# Patient Record
Sex: Female | Born: 1955 | Race: Black or African American | Hispanic: No | Marital: Married | Smoking: Never smoker
Health system: Southern US, Community
[De-identification: ages and names within clinical notes are randomized; demographics above are authoritative.]

## PROBLEM LIST (undated history)

## (undated) DIAGNOSIS — Z9109 Other allergy status, other than to drugs and biological substances: Secondary | ICD-10-CM

## (undated) DIAGNOSIS — D869 Sarcoidosis, unspecified: Secondary | ICD-10-CM

## (undated) DIAGNOSIS — I10 Essential (primary) hypertension: Secondary | ICD-10-CM

## (undated) DIAGNOSIS — J45909 Unspecified asthma, uncomplicated: Secondary | ICD-10-CM

## (undated) HISTORY — PX: CHOLECYSTECTOMY: SHX55

## (undated) HISTORY — DX: Other allergy status, other than to drugs and biological substances: Z91.09

## (undated) HISTORY — DX: Sarcoidosis, unspecified: D86.9

---

## 2005-05-25 ENCOUNTER — Ambulatory Visit: Payer: Self-pay

## 2005-06-21 ENCOUNTER — Ambulatory Visit: Payer: Self-pay | Admitting: Internal Medicine

## 2006-08-14 ENCOUNTER — Emergency Department: Payer: Self-pay | Admitting: Emergency Medicine

## 2007-02-14 ENCOUNTER — Ambulatory Visit: Payer: Self-pay | Admitting: Internal Medicine

## 2010-04-22 ENCOUNTER — Ambulatory Visit: Payer: Self-pay | Admitting: Internal Medicine

## 2010-09-22 ENCOUNTER — Ambulatory Visit: Payer: Self-pay | Admitting: Internal Medicine

## 2011-10-11 ENCOUNTER — Ambulatory Visit: Payer: Self-pay | Admitting: Internal Medicine

## 2012-01-24 ENCOUNTER — Ambulatory Visit: Payer: Self-pay

## 2012-10-17 ENCOUNTER — Ambulatory Visit: Payer: Self-pay

## 2013-06-01 ENCOUNTER — Inpatient Hospital Stay: Payer: Self-pay | Admitting: Internal Medicine

## 2013-06-01 LAB — BASIC METABOLIC PANEL
BUN: 22 mg/dL — ABNORMAL HIGH (ref 7–18)
Chloride: 105 mmol/L (ref 98–107)
Co2: 27 mmol/L (ref 21–32)
EGFR (African American): 55 — ABNORMAL LOW
EGFR (Non-African Amer.): 48 — ABNORMAL LOW
Glucose: 111 mg/dL — ABNORMAL HIGH (ref 65–99)
Osmolality: 280 (ref 275–301)
Potassium: 4.1 mmol/L (ref 3.5–5.1)

## 2013-06-01 LAB — CBC
HCT: 36.5 % (ref 35.0–47.0)
MCH: 28.4 pg (ref 26.0–34.0)
MCHC: 33.1 g/dL (ref 32.0–36.0)
WBC: 4.9 10*3/uL (ref 3.6–11.0)

## 2013-06-01 LAB — CK TOTAL AND CKMB (NOT AT ARMC): CK, Total: 105 U/L (ref 21–215)

## 2013-06-01 LAB — DIFFERENTIAL
Basophil #: 0 10*3/uL (ref 0.0–0.1)
Basophil %: 0.6 %
Eosinophil #: 0.1 10*3/uL (ref 0.0–0.7)
Eosinophil %: 1.2 %
Monocyte #: 0.6 x10 3/mm (ref 0.2–0.9)
Monocyte %: 11.8 %

## 2013-06-02 LAB — BASIC METABOLIC PANEL
Anion Gap: 8 (ref 7–16)
BUN: 19 mg/dL — ABNORMAL HIGH (ref 7–18)
Calcium, Total: 8.6 mg/dL (ref 8.5–10.1)
Glucose: 96 mg/dL (ref 65–99)
Osmolality: 283 (ref 275–301)
Potassium: 3.6 mmol/L (ref 3.5–5.1)

## 2013-06-02 LAB — CBC WITH DIFFERENTIAL/PLATELET
HGB: 10.4 g/dL — ABNORMAL LOW (ref 12.0–16.0)
Lymphocyte #: 0.8 10*3/uL — ABNORMAL LOW (ref 1.0–3.6)
Lymphocyte %: 20 %
MCH: 28.5 pg (ref 26.0–34.0)
MCHC: 33 g/dL (ref 32.0–36.0)
MCV: 86 fL (ref 80–100)
Monocyte %: 13.9 %
Neutrophil #: 2.5 10*3/uL (ref 1.4–6.5)
RBC: 3.66 10*6/uL — ABNORMAL LOW (ref 3.80–5.20)
RDW: 14.6 % — ABNORMAL HIGH (ref 11.5–14.5)
WBC: 3.9 10*3/uL (ref 3.6–11.0)

## 2013-06-03 LAB — CBC WITH DIFFERENTIAL/PLATELET
Basophil %: 0.1 %
Eosinophil %: 0 %
HCT: 32.8 % — ABNORMAL LOW (ref 35.0–47.0)
Lymphocyte #: 0.4 10*3/uL — ABNORMAL LOW (ref 1.0–3.6)
Lymphocyte %: 11 %
MCH: 28.3 pg (ref 26.0–34.0)
MCHC: 33 g/dL (ref 32.0–36.0)
MCV: 86 fL (ref 80–100)
Monocyte %: 3.6 %
Neutrophil #: 3.4 10*3/uL (ref 1.4–6.5)
Platelet: 185 10*3/uL (ref 150–440)
RBC: 3.82 10*6/uL (ref 3.80–5.20)
RDW: 14.1 % (ref 11.5–14.5)
WBC: 4 10*3/uL (ref 3.6–11.0)

## 2013-06-03 LAB — BASIC METABOLIC PANEL
Anion Gap: 9 (ref 7–16)
BUN: 21 mg/dL — ABNORMAL HIGH (ref 7–18)
Calcium, Total: 9.4 mg/dL (ref 8.5–10.1)
Chloride: 106 mmol/L (ref 98–107)
Co2: 26 mmol/L (ref 21–32)
Creatinine: 1.03 mg/dL (ref 0.60–1.30)
EGFR (African American): >60
Glucose: 139 mg/dL — ABNORMAL HIGH (ref 65–99)
Osmolality: 286 (ref 275–301)
Sodium: 141 mmol/L (ref 136–145)

## 2013-06-06 LAB — CULTURE, BLOOD (SINGLE)

## 2013-12-03 ENCOUNTER — Ambulatory Visit: Payer: Self-pay

## 2015-01-23 ENCOUNTER — Emergency Department: Payer: Self-pay | Admitting: Emergency Medicine

## 2015-01-28 ENCOUNTER — Ambulatory Visit: Payer: Self-pay | Admitting: Physician Assistant

## 2015-03-27 NOTE — H&P (Signed)
PATIENT NAME:  Marissa Luna, Marissa Luna MR#:  [id number] DATE OF BIRTH:  01/11/1956  DATE OF ADMISSION:  06/01/2013  Addendum  SOCIAL HISTORY:  The patient was never a smoker.  No alcohol or drug use.  Married, lives with her husband.    ____________________________ Krystal EatonShayiq Samson Ralph, MD sa:ea D: 06/01/2013 23:04:40 ET T: 06/02/2013 00:17:41 ET JOB#: [id number]  cc: Krystal EatonShayiq Janina Trafton, MD, <Dictator> Krystal EatonSHAYIQ Nicki Gracy MD ELECTRONICALLY SIGNED 07/06/2013 11:31

## 2015-03-27 NOTE — H&P (Signed)
PATIENT NAME:  Marissa Luna, [name] MR#:  [id number] DATE OF BIRTH:  [birth year]  DATE OF ADMISSION:  06/01/2013  PRIMARY CARE PHYSICIAN:  Dr. Beverely RisenFozia Khan.   PRIMARY PULMONOLOGIST:  Dr. Freda MunroSaadat Khan.   REFERRING PHYSICIAN:  Dr. Darnelle CatalanMalinda.  CHIEF COMPLAINT:  Shortness of breath, fever.   HISTORY OF PRESENT ILLNESS:  The patient is a pleasant 59 year old African American female with a history of sarcoid, COPD, hypertension, who presents with shortness of breath, cough, fevers.  The patient states that she has chronic dyspnea on exertion and over the last couple of days has been having a productive yellow-greenish sputum with fevers, feeling hot and cold and is more short of breath than baseline.  She came into the hospital where she was found to have hypoxia with O2 sats as low as 87 with ambulation.  She has a fever and tachycardia as well.  X-ray of the chest shows likely acute interstitial edema or pneumonia superimposed upon pulmonary fibrosis.  Hospitalist service were contacted for further evaluation and management.  The patient has been given azithromycin and ceftriaxone.   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   OUTPATIENT MEDICATIONS:  Spiriva 18 mcg inhaled daily, ProAir HFA 2 puffs 4 times a day as needed, Dulera 5/100 mcg 2 puffs 3 times a day and amlodipine 10 mg daily.  The patient also takes vitamin D and vitamin B12.   FAMILY HISTORY:  Three brothers with cancer.  Parents with cancer.  Sister with diabetes.   PAST SURGICAL HISTORY:  No surgeries.   REVIEW OF SYSTEMS:  CONSTITUTIONAL:  Positive for fever.  EYES:  No blurry vision, double vision.  EARS, NOSE, THROAT:  No tinnitus or hearing loss.  No snoring or sore throat.  RESPIRATORY:  Positive for cough which is productive.  Positive for shortness of breath, COPD and sarcoid.  CARDIOVASCULAR:  Denies chest pain.  Has no orthopnea.   Has no lower extremity edema, but has dyspnea on exertion.  No chest pains.  GASTROINTESTINAL:  No nausea, vomiting, diarrhea, abdominal pain or hematemesis.  GENITOURINARY:  Denies dysuria, hematuria.  HEMATOLOGY AND LYMPHATIC:  No anemia or easy bruising.  SKIN:  Has cutaneous sarcoid.  MUSCULOSKELETAL:  Has some aches in the lower extremities for the past couple of days. NEUROLOGIC:  No numbness, weakness.  PSYCHIATRIC:  No anxiety or insomnia.   PHYSICAL EXAMINATION:  VITAL SIGNS:  Temperature on arrival was 100.9, pulse rate was 120, respiratory rate 20, blood pressure on arrival 138/65.  O2 sat was 90% on room air.  It goes down to 87% with ambulation with heart rate of 130.   GENERAL:  The patient is a well-developed [place] female lying in bed in no obvious distress.  HEENT:  Normocephalic, atraumatic.  Pupils are equal and reactive.  Anicteric sclerae.  Extraocular muscles intact.  Moist mucous membranes.  There is cutaneous plaques and patches on the face, both sides, more around the bridge of the nose and the right side lateral to the nose.  NECK:  Supple.  No thyroid tenderness.  No cervical lymphadenopathy.  CARDIOVASCULAR:  S1, S2, tachycardic.  No murmurs, rubs or gallops.  LUNGS:  Bilateral lower coarse crackles to mid lungs, good air entry on the upper lobes.  ABDOMEN:  Soft, nontender, nondistended.  Positive bowel sounds in all quadrants.  EXTREMITIES:  No significant lower extremity edema.  NEUROLOGIC:  Cranial nerves II through XII grossly intact.  Strength is 5 out of 5 in all extremities.  Sensation is intact to light touch.  SKIN:  No other rashes except for the findings on the face and some healed patches on the front thorax.  PSYCHIATRIC:  Awake, alert, oriented x 3.   LABORATORY DATA:  Glucose 111, BUN 22, creatinine 1.24, sodium 138, potassium 4.1, CK-MB, CK total and troponins not elevated.  WBC 4.9, hemoglobin 12.1, platelets are 173.   EKG:  Sinus  tachycardia, rate is 120.  No acute ST elevations or depressions.   ASSESSMENT AND PLAN:  We have a pleasant 59 year old female with history of chronic obstructive pulmonary disease who has never been a smoker with a history of pulmonary and cutaneous sarcoid who sees Dr. Freda Munro, previously on steroids, not now, who presents with acute on chronic respiratory failure and positive systemic inflammatory response syndrome criteria likely sepsis with pneumonia as the source.  Would admit the patient to the hospital.  Start her on ceftriaxone and azithromycin to cover the lung as well as atypical organisms.  We will send sputum cultures.  I would start the patient on oxygen, nebs every 4 hours while awake and consider a pulmonary consult if she does not improve.  The patient has never been a smoker and this is likely not a chronic obstructive pulmonary disease flare.  We would follow with the cultures including the blood pressure which have been sent for the likely sepsis.  Would continue the Spiriva and Dulera.  The patient may take her own Elwin Sleight as we do not have that in the formulary here in the pharmacy.  Her blood pressure is stable.  Would continue the amlodipine.  We would watch her sats and the patient is FULL CODE.   Total time spent is 40 minutes.  The patient's tachycardia is likely driven by her lungs and shortness of breath and sepsis.  It is sinus and this could be monitored on remote tele.    ____________________________ Krystal Eaton, MD sa:ea D: 06/01/2013 22:53:47 ET T: 06/01/2013 23:53:57 ET JOB#: [id number]  cc: Krystal Eaton, MD, <Dictator> Yevonne Pax, MD Lyndon Code, MD Marcelle Smiling [facility] MD ELECTRONICALLY SIGNED 07/06/2013 11:31

## 2015-03-27 NOTE — Discharge Summary (Signed)
PATIENT NAME:  Marissa Luna, Marissa Luna MR#:  [id number] DATE OF BIRTH:  Mar 15, 1956  DATE OF ADMISSION:  06/01/2013 DATE OF DISCHARGE:  06/04/2013  PRIMARY CARE PHYSICIAN:  Dr. Beverely Risen.   PULMONOLOGIST: Freda Munro.     FINAL DIAGNOSES: 1.  Acute respiratory failure, now chronic with pulse ox dropping to 85% with ambulation.  2.  Clinical sepsis with pneumonia.  3.  History of sarcoidosis.  4.  History of hypertension.   MEDICATIONS ON DISCHARGE: Include Spiriva 1 inhalation daily, ProAir HFA CFC 2 puffs 4 times a day as needed, Dulera 5/100 mcg per inhalation 2 puffs twice a day, prednisone 20 mg 3 tablets once a day until seen by Dr. Welton Flakes on Thursday. He will then start tapering dose. Cefuroxime 500 mg every 12 hours for 7 days, Zithromax 250 mg daily for another 2 days, oxygen 1 liter at rest, 2 liters with ambulation at night.   DIET: Low sodium diet, regular consistency.   ACTIVITY: As tolerated.   DISCHARGE FOLLOWUP:  Follow up with Dr. Freda Munro on Thursday at 11:00 a.m., in 2 to 4 weeks with Dr. Beverely Risen.   HOSPITAL COURSE: The patient was admitted in the evening of 06/01/2013 and discharged in the morning of 06/04/2013. The patient came in with a chief complaint of shortness of breath and fever. She was started on ceftriaxone and Zithromax. She was started on oxygen supplementation secondary to low pulse oximetry. Her laboratory and radiological data during the hospital course included an EKG that showed sinus tachycardia. Glucose 111, BUN 22, creatinine 1.24, sodium 138, potassium 4.1, chloride 105, CO2 of 27, calcium 9.3. White blood cell count 4.9, H and H 12.1 and 36.5, platelet count of 173. Troponin negative. Chest x-ray showed findings worrisome for acute interstitial edema or pneumonia superimposed on bilateral pulmonary fibrosis. Blood cultures negative for 48 hours. Creatinine on the 30th, 1.03. White blood cell count on the 30th, 4.0, hemoglobin 10.8, platelet count on 185. Pulse  ox after ambulation by respiratory dropped down to 82%. Did recover at rest on room air to 93%. When I walked around the patient, it dropped down to 85% with ambulation and recovered up to 93% at rest.   HOSPITAL COURSE PER PROBLEM LIST:  1.  For the patient's acute respiratory failure, this could now be chronic with her pulse ox dropping down to 85% with ambulation. I did set her up for 24/7 oxygen. She already had oxygen at night. This is likely secondary to the sarcoidosis in the lungs.  Patient at rest, pulse ox was 93%, so she will definitely need the oxygen while ambulating and at night.  This will become an issue with her job. I did give her a work note until seen by followup appointment with Dr. Freda Munro as outpatient.  2.  Clinical sepsis with pneumonia. The patient did improve with Rocephin and Zithromax, and switched over to Ceftin and Zithromax upon discharge. Most improvement happened when I started the IV Solu-Medrol, starting on 06/02/2013. Will complete the antibiotic course as outpatient.  3.  History of sarcoidosis. I did start steroids, high dose, on June 29th. I was almost going to send her home on the 30th, but the patient would rather give it another day, and since she was hypoxic, I kept her another day. I switched her over to 60 mg of prednisone on July 1st. She will stay on that dose of 60 mg daily until seen by Dr. Freda Munro as outpatient, and he  will consider tapering at that point.  4.  History of hypertension. Blood pressure was on the lower side here, so I did stop the amlodipine.   TIME SPENT ON DISCHARGE: 40 minutes, coordination of care.   ____________________________ Herschell Dimesichard J. Renae GlossWieting, MD rjw:dmm D: 06/04/2013 16:41:43 ET T: 06/04/2013 20:00:40 ET JOB#: [id number]  cc: Herschell Dimesichard J. Renae GlossWieting, MD, <Dictator> Lyndon CodeFozia M. Khan, MD Yevonne PaxSaadat A. Khan, MD Salley ScarletICHARD J Bahar Shelden MD ELECTRONICALLY SIGNED 06/15/2013 11:06

## 2015-10-23 ENCOUNTER — Encounter: Payer: Self-pay | Admitting: *Deleted

## 2015-10-26 ENCOUNTER — Ambulatory Visit: Payer: Managed Care, Other (non HMO) | Admitting: Anesthesiology

## 2015-10-26 ENCOUNTER — Ambulatory Visit
Admission: RE | Admit: 2015-10-26 | Discharge: 2015-10-26 | Disposition: A | Discharge status: Home Health | Payer: Managed Care, Other (non HMO) | Source: Ambulatory Visit | Attending: Gastroenterology | Admitting: Gastroenterology

## 2015-10-26 ENCOUNTER — Encounter
Admission: RE | Disposition: A | Discharge status: Home Health | Payer: Self-pay | Source: Ambulatory Visit | Attending: Gastroenterology

## 2015-10-26 ENCOUNTER — Encounter: Payer: Self-pay | Admitting: *Deleted

## 2015-10-26 DIAGNOSIS — I10 Essential (primary) hypertension: Secondary | ICD-10-CM | POA: Diagnosis not present

## 2015-10-26 DIAGNOSIS — Z9049 Acquired absence of other specified parts of digestive tract: Secondary | ICD-10-CM | POA: Diagnosis not present

## 2015-10-26 DIAGNOSIS — K5730 Diverticulosis of large intestine without perforation or abscess without bleeding: Secondary | ICD-10-CM | POA: Insufficient documentation

## 2015-10-26 DIAGNOSIS — Z79899 Other long term (current) drug therapy: Secondary | ICD-10-CM | POA: Diagnosis not present

## 2015-10-26 DIAGNOSIS — J45909 Unspecified asthma, uncomplicated: Secondary | ICD-10-CM | POA: Insufficient documentation

## 2015-10-26 DIAGNOSIS — Z800 Family history of malignant neoplasm of digestive organs: Secondary | ICD-10-CM | POA: Diagnosis not present

## 2015-10-26 DIAGNOSIS — Z1211 Encounter for screening for malignant neoplasm of colon: Secondary | ICD-10-CM | POA: Diagnosis not present

## 2015-10-26 HISTORY — DX: Unspecified asthma, uncomplicated: J45.909

## 2015-10-26 HISTORY — DX: Essential (primary) hypertension: I10

## 2015-10-26 HISTORY — PX: COLONOSCOPY: SHX5424

## 2015-10-26 SURGERY — COLONOSCOPY
Anesthesia: General

## 2015-10-26 MED ORDER — SODIUM CHLORIDE 0.9 % IV SOLN
INTRAVENOUS | Status: DC
  Administered 2015-10-26: 09:00:00 via INTRAVENOUS

## 2015-10-26 MED ORDER — PROPOFOL 500 MG/50ML IV EMUL
INTRAVENOUS | Status: DC | PRN
Start: 2015-10-26 — End: 2015-10-26
  Administered 2015-10-26: 100 ug/kg/min via INTRAVENOUS

## 2015-10-26 MED ORDER — SODIUM CHLORIDE 0.9 % IV SOLN
INTRAVENOUS | Status: DC | PRN
  Administered 2015-10-26: 09:00:00 via INTRAVENOUS

## 2015-10-26 MED ORDER — SODIUM CHLORIDE 0.9 % IV SOLN: INTRAVENOUS | Status: DC

## 2015-10-26 NOTE — Op Note (Signed)
[facility] Patient Name: Donivan ScullKatie Mcarthy Procedure Date: 10/26/2015 8:41 AM MRN: [id number] Account #: [id number] Date of Birth: 08/03/1956 Admit Type: Outpatient Age: [age] [facility] ROOM 4 Gender: Female Note Status: Finalized Procedure:         Colonoscopy Indications:       Screening in patient at increased risk: Family history of                     1st-degree relative with colorectal cancer Providers:         Ezzard StandingPaul Y. Bluford Kaufmannh, MD Referring MD:      Lyndon CodeFozia M. Khan, MD (Referring MD) Medicines:         Monitored Anesthesia Care Complications:     No immediate complications. Procedure:         Pre-Anesthesia Assessment:                    - Prior to the procedure, a History and Physical was                     performed, and patient medications, allergies and                     sensitivities were reviewed. The patient's tolerance of                     previous anesthesia was reviewed.                    - The risks and benefits of the procedure and the sedation                     options and risks were discussed with the patient. All                     questions were answered and informed consent was obtained.                    - After reviewing the risks and benefits, the patient was                     deemed in satisfactory condition to undergo the procedure.                    After obtaining informed consent, the colonoscope was                     passed under direct vision. Throughout the procedure, the                     patient's blood pressure, pulse, and oxygen saturations                     were monitored continuously. The Olympus CF-H180AL                     colonoscope ( S#: [id number] ) was introduced through the                     anus and advanced to the the cecum, identified by                     appendiceal orifice and ileocecal valve. The colonoscopy  was performed with difficulty due to restricted  mobility                     of the colon. Successful completion of the procedure was                     aided by withdrawing the scope and replacing with the                     pediatric endoscope. The patient tolerated the procedure                     well. The quality of the bowel preparation was fair. Findings:      Multiple small and large-mouthed diverticula were found in the sigmoid       colon and in the descending colon. Was not able to navigate around       sigmoid with CF scope. Switched to PCF. With this scope, able to reach       cecum.      The exam was otherwise without abnormality. Impression:        - Diverticulosis in the sigmoid colon and in the                     descending colon.                    - The examination was otherwise normal.                    - No specimens collected. Recommendation:    - Discharge patient to home.                    - Repeat colonoscopy in 5 years for surveillance.                    - High fiber diet daily. Procedure Code(s): --- Professional ---                    [phone], Colonoscopy, flexible; diagnostic, including                     collection of specimen(s) by brushing or washing, when                     performed (separate procedure) Diagnosis Code(s): --- Professional ---                    Z80.0, Family history of malignant neoplasm of digestive                     organs                    K57.30, Diverticulosis of large intestine without                     perforation or abscess without bleeding CPT copyright 2014 American Medical Association. All rights reserved. The codes documented in this report are preliminary and upon coder review may  be revised to meet current compliance requirements. Wallace Cullens, MD 10/26/2015 9:04:25 AM This report has been signed electronically. Number of Addenda: 0 Note Initiated On: 10/26/2015 8:41 AM Scope Withdrawal Time: 0 hours 4 minutes 26 seconds  Total Procedure Duration: 0  hours 16 minutes 52 seconds  [facility]

## 2015-10-26 NOTE — H&P (Signed)
    Primary Care Physician:  No primary care provider on file. Primary Gastroenterologist:  Dr. Bluford Kaufmannh  Pre-Procedure History & Physical: HPI:  Marissa Luna is a 59 y.o. female is here for an colonoscopy.   Past Medical History  Diagnosis Date  . Asthma   . Hypertension     Past Surgical History  Procedure Laterality Date  . No past surgeries    . Cholecystectomy      Prior to Admission medications   Medication Sig Start Date End Date Taking? Authorizing Provider  amLODipine (NORVASC) 5 MG tablet Take 5 mg by mouth daily.   Yes Historical Provider, MD  predniSONE (DELTASONE) 5 MG tablet Take 5 mg by mouth daily with breakfast.   Yes Historical Provider, MD  tiotropium (SPIRIVA) 18 MCG inhalation capsule Place 18 mcg into inhaler and inhale daily.   Yes Historical Provider, MD    Allergies as of 09/21/2015  . (Not on File)    History reviewed. No pertinent family history.  Social History   Social History  . Marital Status: Married    Spouse Name: N/A  . Number of Children: N/A  . Years of Education: N/A   Occupational History  . Not on file.   Social History Main Topics  . Smoking status: Never Smoker   . Smokeless tobacco: Never Used  . Alcohol Use: No  . Drug Use: No  . Sexual Activity: Not on file   Other Topics Concern  . Not on file   Social History Narrative    Review of Systems: See HPI, otherwise negative ROS  Physical Exam: BP 141/69 mmHg  Pulse 92  Temp(Src) 98.3 F (36.8 C) (Tympanic)  Resp 14  Ht 5' (1.524 m)  Wt 68.04 kg (150 lb)  BMI 29.30 kg/m2  SpO2 99% General:   Alert,  pleasant and cooperative in NAD Head:  Normocephalic and atraumatic. Neck:  Supple; no masses or thyromegaly. Lungs:  Clear throughout to auscultation.    Heart:  Regular rate and rhythm. Abdomen:  Soft, nontender and nondistended. Normal bowel sounds, without guarding, and without rebound.   Neurologic:  Alert and  oriented x4;  grossly normal  neurologically.  Impression/Plan: Marissa Luna is here for an colonoscopy to be performed for screening, family hx of colon cancer  Risks, benefits, limitations, and alternatives regarding colonoscopy rewed with the patient.  Questions have been answered.  All parties agreeable.   Hussam Muniz, Ezzard StandingPAUL Y, MD  10/26/2015, 8:29 AM

## 2015-10-26 NOTE — Anesthesia Preprocedure Evaluation (Addendum)
Anesthesia Evaluation  Patient identified by MRN, date of birth, ID band Patient awake    Reviewed: Allergy & Precautions, NPO status , Patient's Chart, lab work & pertinent test results  Airway Mallampati: III  TM Distance: >3 FB     Dental no notable dental hx.    Pulmonary asthma ,    Pulmonary exam normal        Cardiovascular hypertension, Pt. on medications Normal cardiovascular exam     Neuro/Psych negative neurological ROS  negative psych ROS   GI/Hepatic negative GI ROS, Neg liver ROS,   Endo/Other  negative endocrine ROS  Renal/GU negative Renal ROS  negative genitourinary   Musculoskeletal negative musculoskeletal ROS (+)   Abdominal Normal abdominal exam  (+)   Peds negative pediatric ROS (+)  Hematology negative hematology ROS (+)   Anesthesia Other Findings   Reproductive/Obstetrics                            Anesthesia Physical Anesthesia Plan  ASA: II  Anesthesia Plan: General   Post-op Pain Management:    Induction: Intravenous  Airway Management Planned: Nasal Cannula  Additional Equipment:   Intra-op Plan:   Post-operative Plan:   Informed Consent: I have reviewed the patients History and Physical, chart, labs and discussed the procedure including the risks, benefits and alternatives for the proposed anesthesia with the patient or authorized representative who has indicated his/her understanding and acceptance.   Dental advisory given  Plan Discussed with: CRNA and Surgeon  Anesthesia Plan Comments:         Anesthesia Quick Evaluation

## 2015-10-26 NOTE — Transfer of Care (Signed)
Immediate Anesthesia Transfer of Care Note  Patient: Marissa Luna  Procedure(s) Performed: Procedure(s): COLONOSCOPY (N/A)  Patient Location: PACU and Endoscopy Unit  Anesthesia Type:General  Level of Consciousness: patient cooperative and lethargic  Airway & Oxygen Therapy: Patient Spontanous Breathing and Patient connected to nasal cannula oxygen  Post-op Assessment: Report given to RN and Post -op Vital signs reviewed and stable  Post vital signs: Reviewed and stable  Last Vitals:  Filed Vitals:   10/26/15 0748 10/26/15 0906  BP: 141/69 104/53  Pulse: 92 86  Temp: 36.8 C 35.5 C  Resp: 14     Complications: No apparent anesthesia complications

## 2015-10-27 ENCOUNTER — Encounter: Payer: Self-pay | Admitting: Gastroenterology

## 2015-10-30 NOTE — Anesthesia Postprocedure Evaluation (Signed)
Anesthesia Post Note  Patient: Marissa MiyamotoKatie S Luna  Procedure(s) Performed: Procedure(s) (LRB): COLONOSCOPY (N/A)  Patient location during evaluation: Endoscopy Anesthesia Type: General Level of consciousness: awake, awake and alert and oriented Pain management: pain level controlled Vital Signs Assessment: post-procedure vital signs reviewed and stable Respiratory status: spontaneous breathing Cardiovascular status: blood pressure returned to baseline Anesthetic complications: no    Last Vitals:  Filed Vitals:   10/26/15 0928 10/26/15 0940  BP: 116/58 120/59  Pulse: 72 75  Temp:    Resp: 18 16    Last Pain:  Filed Vitals:   10/27/15 0747  PainSc: 0-No pain                 Arhianna Ebey

## 2016-02-11 ENCOUNTER — Ambulatory Visit
Admission: RE | Admit: 2016-02-11 | Discharge: 2016-02-11 | Disposition: A | Discharge status: Home / Self Care | Payer: Managed Care, Other (non HMO) | Source: Ambulatory Visit | Attending: Physician Assistant | Admitting: Physician Assistant

## 2016-02-11 ENCOUNTER — Other Ambulatory Visit: Payer: Self-pay | Admitting: Physician Assistant

## 2016-02-11 DIAGNOSIS — J8410 Pulmonary fibrosis, unspecified: Secondary | ICD-10-CM | POA: Diagnosis not present

## 2016-02-11 DIAGNOSIS — R05 Cough: Secondary | ICD-10-CM | POA: Diagnosis present

## 2016-02-11 DIAGNOSIS — R0602 Shortness of breath: Secondary | ICD-10-CM

## 2016-02-11 DIAGNOSIS — R059 Cough, unspecified: Secondary | ICD-10-CM

## 2016-12-28 ENCOUNTER — Encounter: Payer: Self-pay | Admitting: Urology

## 2016-12-28 ENCOUNTER — Ambulatory Visit (INDEPENDENT_AMBULATORY_CARE_PROVIDER_SITE_OTHER): Payer: Managed Care, Other (non HMO) | Admitting: Urology

## 2016-12-28 VITALS — BP 133/80 | HR 83 | Ht 60.0 in | Wt 142.2 lb

## 2016-12-28 DIAGNOSIS — N8111 Cystocele, midline: Secondary | ICD-10-CM

## 2016-12-28 NOTE — Progress Notes (Signed)
12/28/2016 4:30 PM   Marissa Luna 05/30/1956 [id number]  Referring provider: Carlean JewsHeather E Boscia, NP [address] [place], [place] [id number]  Chief Complaint  Patient presents with  . New Patient (Initial Visit)    bladder prolapse     HPI: The last 6 months the patient noticed some mild vaginal bulging sensation. She has not had a hysterectomy. She does not reduce it. She is continent. Sometimes she gets up twice per night to urinate. She voids every 2-3 hours during the daytime.  She denies a history of kidney stones previous GU surgery and urinary tract infections. She has no neurologic issues. Her bowel movements are normal  Modifying factors: There are no other modifying factors  Associated signs and symptoms: There are no other associated signs and symptoms Aggravating and relieving factors: There are no other aggravating or relieving factors Severity: Mild Duration: Persistent     PMH: Past Medical History:  Diagnosis Date  . Asthma   . Hypertension     Surgical History: Past Surgical History:  Procedure Laterality Date  . CHOLECYSTECTOMY    . COLONOSCOPY N/A 10/26/2015   Procedure: COLONOSCOPY;  Surgeon: Wallace CullensPaul Y Oh, MD;  [facility];  Service: Gastroenterology;  Laterality: N/A;  . NO PAST SURGERIES      Home Medications:  Allergies as of 12/28/2016   No Known Allergies     Medication List       Accurate as of 12/28/16  4:30 PM. Always use your most recent med list.          amLODipine 5 MG tablet Commonly known as:  NORVASC Take 5 mg by mouth daily.   BREO ELLIPTA 100-25 MCG/INH Aepb Generic drug:  fluticasone furoate-vilanterol   predniSONE 5 MG tablet Commonly known as:  DELTASONE Take 5 mg by mouth daily with breakfast.   tiotropium 18 MCG inhalation capsule Commonly known as:  SPIRIVA Place 18 mcg into inhaler and inhale daily.   Vitamin D (Ergocalciferol) 50000 units Caps capsule Commonly known as:  DRISDOL Take by  mouth.       Allergies: No Known Allergies  Family History: No family history on file.  Social History:  reports that she has never smoked. She has never used smokeless tobacco. She reports that she does not drink alcohol or use drugs.  ROS: UROLOGY Frequent Urination?: No Hard to postpone urination?: No Burning/pain with urination?: No Get up at night to urinate?: No Leakage of urine?: No Urine stream starts and stops?: No Trouble starting stream?: No Do you have to strain to urinate?: No Blood in urine?: No Urinary tract infection?: No Sexually transmitted disease?: No Injury to kidneys or bladder?: No Painful intercourse?: No Weak stream?: No Currently pregnant?: No Vaginal bleeding?: No Last menstrual period?: n  Gastrointestinal Nausea?: No Vomiting?: No Indigestion/heartburn?: No Diarrhea?: No Constipation?: No  Constitutional Fever: No Night sweats?: No Weight loss?: No Fatigue?: No  Skin Skin rash/lesions?: No Itching?: No  Eyes Blurred vision?: No Double vision?: No  Ears/Nose/Throat Sore throat?: No Sinus problems?: No  Hematologic/Lymphatic Swollen glands?: No Easy bruising?: No  Cardiovascular Leg swelling?: No Chest pain?: No  Respiratory Cough?: Yes Shortness of breath?: Yes  Endocrine Excessive thirst?: No  Musculoskeletal Back pain?: No Joint pain?: No  Neurological Headaches?: No Dizziness?: No  Psychologic Depression?: No Anxiety?: No  Physical Exam: BP 133/80   Pulse 83   Ht 5' (1.524 m)   Wt 142 lb 3.2 oz (64.5 kg)  BMI 27.77 kg/m   Constitutional:  Alert and oriented, No acute distress. HEENT: [place] AT, moist mucus membranes.  Trachea midline, no masses. Cardiovascular: No clubbing, cyanosis, or edema. Respiratory: Normal respiratory effort, no increased work of breathing. GI: Abdomen is soft, nontender, nondistended, no abdominal masses GU: No CVA tenderness. The patient had a modest grade 3 cystocele  with central defect. Her uterus descended 2-3 cm. She had a negative cough test. She had no rectocele. Skin: No rashes, bruises or suspicious lesions. Lymph: No cervical or inguinal adenopathy. Neurologic: Grossly intact, no focal deficits, moving all 4 extremities. Psychiatric: Normal mood and affect.  Laboratory Data: Lab Results  Component Value Date   WBC 4.0 06/03/2013   HGB 10.8 (L) 06/03/2013   HCT 32.8 (L) 06/03/2013   MCV 86 06/03/2013   PLT 185 06/03/2013    Lab Results  Component Value Date   CREATININE 1.03 06/03/2013    No results found for: PSA  No results found for: TESTOSTERONE  No results found for: HGBA1C  Urinalysis No results found for: COLORURINE, APPEARANCEUR, LABSPEC, PHURINE, GLUCOSEU, HGBUR, BILIRUBINUR, KETONESUR, PROTEINUR, UROBILINOGEN, NITRITE, LEUKOCYTESUR  Pertinent Imaging: none  Assessment & Plan:  The patient has mild prolapse symptoms with minimal voiding dysfunction and mild nocturia. I drew the patient a picture. If she ever had surgery she would likely best benefit from a transvaginal hysterectomy with vault suspension and cystocele repair and graft. The role of a gynecology referral and urodynamics was discussed. Natural history of prolapse discussed. The patient chose watchful waiting. I will see the patient.  1. Cystocele 2. Nighttime frequency  There are no diagnoses linked to this encounter.  No Follow-up on file.  Martina Sinner, MD  [facility] [address] [place], [place] [address] [phone]

## 2017-01-18 ENCOUNTER — Other Ambulatory Visit: Payer: Self-pay

## 2017-11-14 ENCOUNTER — Ambulatory Visit: Payer: Self-pay | Admitting: Internal Medicine

## 2017-12-06 ENCOUNTER — Other Ambulatory Visit: Payer: Self-pay | Admitting: Nurse Practitioner

## 2017-12-06 ENCOUNTER — Telehealth: Payer: Self-pay

## 2017-12-06 DIAGNOSIS — F4329 Adjustment disorder with other symptoms: Secondary | ICD-10-CM

## 2017-12-06 MED ORDER — ALPRAZOLAM 0.25 MG PO TABS: 0.2500 mg | ORAL_TABLET | Freq: Two times a day (BID) | ORAL | 0 refills | Status: DC | PRN

## 2017-12-06 NOTE — Progress Notes (Signed)
Patient called stating her son had passed away. Sent in new rx for alprazolam 0.25mg  twice daily when neeeded. #45 with no refills sent to [facility].

## 2017-12-06 NOTE — Telephone Encounter (Signed)
Pt called that her son deceased last week and she need something  To help her as per heather send alprazolam .25 and pt advised that we send med

## 2017-12-08 ENCOUNTER — Other Ambulatory Visit: Payer: Self-pay

## 2017-12-08 MED ORDER — PREDNISONE 5 MG PO TABS: 5.0000 mg | ORAL_TABLET | Freq: Every day | ORAL | 3 refills | Status: DC

## 2017-12-12 ENCOUNTER — Other Ambulatory Visit: Payer: Self-pay

## 2017-12-12 MED ORDER — VITAMIN D (ERGOCALCIFEROL) 1.25 MG (50000 UNIT) PO CAPS: 50000.0000 [IU] | ORAL_CAPSULE | ORAL | 5 refills | Status: DC

## 2017-12-29 ENCOUNTER — Encounter: Payer: Self-pay | Admitting: Nurse Practitioner

## 2018-01-08 ENCOUNTER — Encounter: Payer: Self-pay | Admitting: Internal Medicine

## 2018-01-08 DIAGNOSIS — D860 Sarcoidosis of lung: Secondary | ICD-10-CM | POA: Insufficient documentation

## 2018-01-08 DIAGNOSIS — D863 Sarcoidosis of skin: Secondary | ICD-10-CM | POA: Insufficient documentation

## 2018-01-08 DIAGNOSIS — J309 Allergic rhinitis, unspecified: Secondary | ICD-10-CM | POA: Insufficient documentation

## 2018-01-08 DIAGNOSIS — R0602 Shortness of breath: Secondary | ICD-10-CM | POA: Insufficient documentation

## 2018-01-08 DIAGNOSIS — I10 Essential (primary) hypertension: Secondary | ICD-10-CM | POA: Insufficient documentation

## 2018-01-08 DIAGNOSIS — R0902 Hypoxemia: Secondary | ICD-10-CM | POA: Insufficient documentation

## 2018-01-09 ENCOUNTER — Ambulatory Visit: Payer: Self-pay | Admitting: Internal Medicine

## 2018-01-11 ENCOUNTER — Other Ambulatory Visit: Payer: Self-pay

## 2018-01-11 ENCOUNTER — Emergency Department: Payer: Managed Care, Other (non HMO)

## 2018-01-11 ENCOUNTER — Encounter: Payer: Self-pay | Admitting: *Deleted

## 2018-01-11 ENCOUNTER — Inpatient Hospital Stay
Admission: EM | Admit: 2018-01-11 | Discharge: 2018-01-15 | DRG: 871 | Disposition: A | Discharge status: Home / Self Care | Payer: Managed Care, Other (non HMO) | Attending: Internal Medicine | Admitting: Internal Medicine

## 2018-01-11 ENCOUNTER — Ambulatory Visit: Payer: Managed Care, Other (non HMO) | Admitting: Nurse Practitioner

## 2018-01-11 ENCOUNTER — Encounter: Payer: Self-pay | Admitting: Nurse Practitioner

## 2018-01-11 VITALS — BP 129/67 | HR 107 | Temp 100.9°F | Resp 16 | Ht 60.0 in | Wt 134.0 lb

## 2018-01-11 DIAGNOSIS — R0902 Hypoxemia: Secondary | ICD-10-CM | POA: Diagnosis present

## 2018-01-11 DIAGNOSIS — Z79899 Other long term (current) drug therapy: Secondary | ICD-10-CM | POA: Diagnosis not present

## 2018-01-11 DIAGNOSIS — R05 Cough: Secondary | ICD-10-CM

## 2018-01-11 DIAGNOSIS — Z9981 Dependence on supplemental oxygen: Secondary | ICD-10-CM | POA: Diagnosis not present

## 2018-01-11 DIAGNOSIS — J189 Pneumonia, unspecified organism: Secondary | ICD-10-CM

## 2018-01-11 DIAGNOSIS — Z7952 Long term (current) use of systemic steroids: Secondary | ICD-10-CM | POA: Diagnosis not present

## 2018-01-11 DIAGNOSIS — A419 Sepsis, unspecified organism: Principal | ICD-10-CM

## 2018-01-11 DIAGNOSIS — R6889 Other general symptoms and signs: Secondary | ICD-10-CM

## 2018-01-11 DIAGNOSIS — J029 Acute pharyngitis, unspecified: Secondary | ICD-10-CM | POA: Diagnosis not present

## 2018-01-11 DIAGNOSIS — J9621 Acute and chronic respiratory failure with hypoxia: Secondary | ICD-10-CM | POA: Diagnosis present

## 2018-01-11 DIAGNOSIS — J1008 Influenza due to other identified influenza virus with other specified pneumonia: Secondary | ICD-10-CM | POA: Diagnosis present

## 2018-01-11 DIAGNOSIS — D860 Sarcoidosis of lung: Secondary | ICD-10-CM | POA: Diagnosis present

## 2018-01-11 DIAGNOSIS — Z7951 Long term (current) use of inhaled steroids: Secondary | ICD-10-CM | POA: Diagnosis not present

## 2018-01-11 DIAGNOSIS — J159 Unspecified bacterial pneumonia: Secondary | ICD-10-CM | POA: Diagnosis present

## 2018-01-11 DIAGNOSIS — I10 Essential (primary) hypertension: Secondary | ICD-10-CM | POA: Diagnosis present

## 2018-01-11 DIAGNOSIS — R059 Cough, unspecified: Secondary | ICD-10-CM

## 2018-01-11 DIAGNOSIS — J101 Influenza due to other identified influenza virus with other respiratory manifestations: Secondary | ICD-10-CM | POA: Diagnosis present

## 2018-01-11 LAB — CBC WITH DIFFERENTIAL/PLATELET
BASOS ABS: 0 10*3/uL (ref 0–0.1)
Basophils Relative: 0 %
EOS PCT: 0 %
Eosinophils Absolute: 0 10*3/uL (ref 0–0.7)
HCT: 39.6 % (ref 35.0–47.0)
HEMOGLOBIN: 12.7 g/dL (ref 12.0–16.0)
LYMPHS PCT: 5 %
Lymphs Abs: 0.5 10*3/uL — ABNORMAL LOW (ref 1.0–3.6)
MCH: 28 pg (ref 26.0–34.0)
MCHC: 32.1 g/dL (ref 32.0–36.0)
MCV: 87.2 fL (ref 80.0–100.0)
Monocytes Absolute: 0.3 10*3/uL (ref 0.2–0.9)
Monocytes Relative: 4 %
NEUTROS ABS: 8.6 10*3/uL — AB (ref 1.4–6.5)
NEUTROS PCT: 91 %
PLATELETS: 271 10*3/uL (ref 150–440)
RBC: 4.54 MIL/uL (ref 3.80–5.20)
RDW: 13.7 % (ref 11.5–14.5)
WBC: 9.5 10*3/uL (ref 3.6–11.0)

## 2018-01-11 LAB — URINALYSIS, ROUTINE W REFLEX MICROSCOPIC
BILIRUBIN URINE: NEGATIVE
GLUCOSE, UA: NEGATIVE mg/dL
KETONES UR: NEGATIVE mg/dL
LEUKOCYTES UA: NEGATIVE
Nitrite: NEGATIVE
PH: 6 (ref 5.0–8.0)
Protein, ur: NEGATIVE mg/dL
Specific Gravity, Urine: 1.01 (ref 1.005–1.030)

## 2018-01-11 LAB — COMPREHENSIVE METABOLIC PANEL
ALT: 15 U/L (ref 14–54)
ANION GAP: 7 (ref 5–15)
AST: 22 U/L (ref 15–41)
Albumin: 3.7 g/dL (ref 3.5–5.0)
Alkaline Phosphatase: 68 U/L (ref 38–126)
BILIRUBIN TOTAL: 0.4 mg/dL (ref 0.3–1.2)
BUN: 17 mg/dL (ref 6–20)
CHLORIDE: 102 mmol/L (ref 101–111)
CO2: 29 mmol/L (ref 22–32)
Calcium: 9 mg/dL (ref 8.9–10.3)
Creatinine, Ser: 1.16 mg/dL — ABNORMAL HIGH (ref 0.44–1.00)
GFR, EST AFRICAN AMERICAN: 57 mL/min — AB (ref >60)
GFR, EST NON AFRICAN AMERICAN: 49 mL/min — AB (ref >60)
Glucose, Bld: 126 mg/dL — ABNORMAL HIGH (ref 65–99)
POTASSIUM: 3.9 mmol/L (ref 3.5–5.1)
Sodium: 138 mmol/L (ref 135–145)
TOTAL PROTEIN: 8.3 g/dL — AB (ref 6.5–8.1)

## 2018-01-11 LAB — LACTIC ACID, PLASMA: Lactic Acid, Venous: 1.1 mmol/L (ref 0.5–1.9)

## 2018-01-11 LAB — INFLUENZA PANEL BY PCR (TYPE A & B)
INFLAPCR: POSITIVE — AB
Influenza B By PCR: NEGATIVE

## 2018-01-11 LAB — POCT INFLUENZA A/B
INFLUENZA B, POC: NEGATIVE
Influenza A, POC: NEGATIVE

## 2018-01-11 MED ORDER — OSELTAMIVIR PHOSPHATE 75 MG PO CAPS
75.0000 mg | ORAL_CAPSULE | Freq: Once | ORAL | Status: AC
  Administered 2018-01-11: 75 mg via ORAL
  Filled 2018-01-11: qty 1

## 2018-01-11 MED ORDER — DEXTROSE 5 % IV SOLN
2.0000 g | Freq: Two times a day (BID) | INTRAVENOUS | Status: DC
  Administered 2018-01-12 – 2018-01-14 (×6): 2 g via INTRAVENOUS
  Filled 2018-01-11 (×8): qty 2

## 2018-01-11 MED ORDER — OSELTAMIVIR PHOSPHATE 75 MG PO CAPS: 75.0000 mg | ORAL_CAPSULE | Freq: Two times a day (BID) | ORAL | 0 refills | Status: DC

## 2018-01-11 MED ORDER — SODIUM CHLORIDE 0.9 % IV BOLUS (SEPSIS)
1000.0000 mL | Freq: Once | INTRAVENOUS | Status: AC
  Administered 2018-01-11: 1000 mL via INTRAVENOUS

## 2018-01-11 MED ORDER — PROMETHAZINE-CODEINE 6.25-10 MG/5ML PO SYRP: 5.0000 mL | ORAL_SOLUTION | Freq: Three times a day (TID) | ORAL | 0 refills | Status: DC | PRN

## 2018-01-11 MED ORDER — DEXTROSE 5 % IV SOLN
2.0000 g | Freq: Once | INTRAVENOUS | Status: AC
  Administered 2018-01-11: 2 g via INTRAVENOUS
  Filled 2018-01-11: qty 2

## 2018-01-11 MED ORDER — VANCOMYCIN HCL IN DEXTROSE 750-5 MG/150ML-% IV SOLN
750.0000 mg | INTRAVENOUS | Status: DC
  Administered 2018-01-12 – 2018-01-14 (×3): 750 mg via INTRAVENOUS
  Filled 2018-01-11 (×4): qty 150

## 2018-01-11 MED ORDER — ACETAMINOPHEN 500 MG PO TABS
1000.0000 mg | ORAL_TABLET | Freq: Once | ORAL | Status: AC
  Administered 2018-01-11: 1000 mg via ORAL
  Filled 2018-01-11: qty 2

## 2018-01-11 MED ORDER — VANCOMYCIN HCL IN DEXTROSE 1-5 GM/200ML-% IV SOLN
1000.0000 mg | Freq: Once | INTRAVENOUS | Status: AC
  Administered 2018-01-11: 1000 mg via INTRAVENOUS
  Filled 2018-01-11: qty 200

## 2018-01-11 MED ORDER — AZITHROMYCIN 250 MG PO TABS: ORAL_TABLET | ORAL | 0 refills | Status: DC

## 2018-01-11 NOTE — Progress Notes (Signed)
[facility] [address] [place], [place] [address]  Internal MEDICINE  Office Visit Note  Patient Name: Marissa Luna  [id number]  [id number]  Date of Service: 01/21/2018  Chief Complaint  Patient presents with  . Sinusitis    bodaches  . Cough  . Headache  . Chills     Sinusitis  This is a new problem. The current episode started in the past 7 days. The problem has been gradually worsening since onset. The maximum temperature recorded prior to her arrival was 100.4 - 100.9 F. The fever has been present for 1 to 2 days. The pain is moderate. Associated symptoms include chills, congestion, coughing, ear pain, headaches, a hoarse voice, sneezing and a sore throat. Past treatments include acetaminophen. The treatment provided no relief.   Pt is here for a sick visit.     Current Medication:  Outpatient Encounter Medications as of 01/11/2018  Medication Sig  . ALPRAZolam (XANAX) 0.25 MG tablet Take 1 tablet (0.25 mg total) by mouth 2 (two) times daily as needed for anxiety. (Patient not taking: Reported on 01/16/2018)  . amLODipine (NORVASC) 5 MG tablet Take 5 mg by mouth daily.  . fluticasone furoate-vilanterol (BREO ELLIPTA) 100-25 MCG/INH AEPB   . predniSONE (DELTASONE) 5 MG tablet Take 1 tablet (5 mg total) by mouth daily with breakfast.  . PROAIR HFA 108 (90 Base) MCG/ACT inhaler Inhale 2 puffs into the lungs every 4 (four) hours as needed for wheezing or shortness of breath.   . promethazine-codeine (PHENERGAN WITH CODEINE) 6.25-10 MG/5ML syrup Take 5 mLs by mouth every 8 (eight) hours as needed for cough.  . tiotropium (SPIRIVA) 18 MCG inhalation capsule Place 18 mcg into inhaler and inhale daily.  . Vitamin D, Ergocalciferol, (DRISDOL) 50000 units CAPS capsule Take 1 capsule (50,000 Units total) by mouth once a week. (Patient taking differently: Take 50,000 Units by mouth once a week. Take on sunday)  . [DISCONTINUED] azithromycin (ZITHROMAX) 250 MG tablet  z-pack - take as directed for 5 days  . [DISCONTINUED] oseltamivir (TAMIFLU) 75 MG capsule Take 1 capsule (75 mg total) by mouth 2 (two) times daily.   No facility-administered encounter medications on file as of 01/11/2018.       Medical History: Past Medical History:  Diagnosis Date  . Asthma   . Environmental allergies   . Hypertension   . Sarcoidosis      Today's Vitals   01/11/18 1202  BP: 129/67  Pulse: (!) 107  Resp: 16  Temp: (!) 100.9 F (38.3 C)  SpO2: 95%  Weight: 134 lb (60.8 kg)  Height: 5' (1.524 m)    Review of Systems  Constitutional: Positive for chills, fatigue and fever.  HENT: Positive for congestion, ear pain, hoarse voice, postnasal drip, rhinorrhea, sinus pain, sneezing, sore throat and voice change.   Eyes: Negative.   Respiratory: Positive for cough and wheezing.   Cardiovascular: Positive for chest pain.  Gastrointestinal: Positive for nausea. Negative for vomiting.  Endocrine: Negative.   Musculoskeletal: Positive for arthralgias and myalgias.  Skin: Negative.   Allergic/Immunologic: Positive for environmental allergies.  Neurological: Positive for weakness and headaches.  Hematological: Positive for adenopathy.  Psychiatric/Behavioral: Negative.     Physical Exam  Constitutional: She is oriented to person, place, and time. She appears well-developed and well-nourished. She appears ill. No distress.  HENT:  Head: Normocephalic and atraumatic.  Right Ear: Tympanic membrane is erythematous and bulging.  Left Ear: Tympanic membrane is erythematous and bulging.  Nose: Rhinorrhea present. Right sinus exhibits maxillary sinus tenderness and frontal sinus tenderness. Left sinus exhibits maxillary sinus tenderness and frontal sinus tenderness.  Mouth/Throat: Posterior oropharyngeal erythema present. No oropharyngeal exudate.  Eyes: EOM are normal. Pupils are equal, round, and reactive to light.  Neck: Normal range of motion. Neck supple. No JVD  present. No tracheal deviation present. No thyromegaly present.  Cardiovascular: Normal rate, regular rhythm and normal heart sounds. Exam reveals no gallop and no friction rub.  No murmur heard. Mild tachycardia  Pulmonary/Chest: Effort normal. No respiratory distress. She has wheezes. She has no rales. She exhibits no tenderness.  Abdominal: Soft. Bowel sounds are normal. There is no tenderness.  Musculoskeletal: Normal range of motion.  Lymphadenopathy:    She has cervical adenopathy.  Neurological: She is alert and oriented to person, place, and time. No cranial nerve deficit.  Skin: Skin is warm and dry. She is not diaphoretic.  Psychiatric: She has a normal mood and affect. Her behavior is normal. Judgment and thought content normal.  Nursing note and vitals reviewed.   Assessment/Plan:  1. Flu-like symptoms - POCT Influenza A/B - flu swabs negative today. Will continue antibiotics and prednisone as previously prescribed.   2. Cough - promethazine-codeine (PHENERGAN WITH CODEINE) 6.25-10 MG/5ML syrup; Take 5 mLs by mouth every 8 (eight) hours as needed for cough.  Dispense: 120 mL; Refill: 0 Use rescue inhaler and neb treatments as needed and as prescribed.   3. Acute pharyngitis, unspecified etiology Continue antibiotics as prescribed. Gargle with warm salt water as needed to help relieve sore throat.   General Counseling: Kelseigh verbalizes understanding of the findings of todays visit and agrees with plan of treatment. I have discussed any further diagnostic evaluation that may be needed or ordered today. We also reviewed her medications today. she has been encouraged to call the office with any questions or concerns that should arise related to todays visit.    Orders Placed This Encounter  Procedures  . POCT Influenza A/B    Meds ordered this encounter  Medications  . DISCONTD: azithromycin (ZITHROMAX) 250 MG tablet    Sig: z-pack - take as directed for 5 days     Dispense:  6 tablet    Refill:  0    Order Specific Question:   Supervising Provider    Answer:   Lyndon CodeKHAN, FOZIA M [[id number]]  . DISCONTD: oseltamivir (TAMIFLU) 75 MG capsule    Sig: Take 1 capsule (75 mg total) by mouth 2 (two) times daily.    Dispense:  10 capsule    Refill:  0    Order Specific Question:   Supervising Provider    Answer:   Lyndon CodeKHAN, FOZIA M [[id number]]  . promethazine-codeine (PHENERGAN WITH CODEINE) 6.25-10 MG/5ML syrup    Sig: Take 5 mLs by mouth every 8 (eight) hours as needed for cough.    Dispense:  120 mL    Refill:  0    Order Specific Question:   Supervising Provider    Answer:   Lyndon CodeKHAN, FOZIA M [[id number]]    Time spent: 20 Minutes

## 2018-01-11 NOTE — ED Provider Notes (Addendum)
[facility] Emergency Department Provider Note  ____________________________________________   First MD Initiated Contact with Patient 01/11/18 [phone]     (approximate)  I have reviewed the triage vital signs and the nursing notes.   HISTORY  Chief Complaint Influenza   HPI Marissa Luna is a 63 y.o. 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  She says that she was diagnosed with flu today despite receiving medications from her primary care doctor she has not taken any at home.  Also denies any ibuprofen or Tylenol today.  Says that she feels weak all over and fell, falling onto her knees, bilaterally to the anterior.  However, she denies any pain at this time.  Says that her complaints are weakness, fever, ear pressure bilaterally as well as runny nose and cough.  Patient placed on 4 L nasal cannula by medics.  Past Medical History:  Diagnosis Date  . Asthma   . Environmental allergies   . Hypertension   . Sarcoidosis     Patient Active Problem List   Diagnosis Date Noted  . 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 Hypoxemia 01/08/2018  . Allergic rhinitis, unspecified 01/08/2018  . Essential (primary) hypertension 01/08/2018  . 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 Shortness of breath 01/08/2018    Past Surgical History:  Procedure Laterality Date  . CHOLECYSTECTOMY    . COLONOSCOPY N/A 10/26/2015   Procedure: COLONOSCOPY;  Surgeon: Wallace Cullens, MD;  Location: [facility] ENDOSCOPY;  Service: Gastroenterology;  Laterality: N/A;  . NO PAST SURGERIES      Prior to Admission medications   Medication Sig Start Date End Date Taking? Authorizing Provider  ALPRAZolam (XANAX) 0.25 MG tablet Take 1 tablet (0.25 mg total) by mouth 2 (two) times daily as needed for anxiety. 12/06/17   Carlean Jews, NP  amLODipine (NORVASC) 5 MG tablet Take 5 mg by mouth daily.    [provider]    azithromycin (ZITHROMAX) 250 MG tablet z-pack - take as directed for 5 days 01/11/18   Carlean Jews, NP  fluticasone furoate-vilanterol (BREO ELLIPTA) 100-25 MCG/INH AEPB  04/18/15   [provider]  oseltamivir (TAMIFLU) 75 MG capsule Take 1 capsule (75 mg total) by mouth 2 (two) times daily. 01/11/18   Carlean Jews, NP  predniSONE (DELTASONE) 5 MG tablet Take 1 tablet (5 mg total) by mouth daily with breakfast. 12/08/17   Carlean Jews, NP  PROAIR HFA 108 [phone] Base) MCG/ACT inhaler  11/05/17   [provider]  promethazine-codeine (PHENERGAN WITH CODEINE) 6.25-10 MG/5ML syrup Take 5 mLs by mouth every 8 (eight) hours as needed for cough. 01/11/18   Carlean Jews, NP  tiotropium (SPIRIVA) 18 MCG inhalation capsule Place 18 mcg into inhaler and inhale daily.    [provider]  Vitamin D, Ergocalciferol, (DRISDOL) 50000 units CAPS capsule Take 1 capsule (50,000 Units total) by mouth once a week. 12/12/17   Carlean Jews, NP    Allergies Patient has no known allergies.  Family History  Problem Relation Age of Onset  . Lung cancer Mother   . Colon cancer Father   . Diabetes Sister     Social History Social History   Tobacco Use  . Smoking status: Never Smoker  . Smokeless tobacco: Never Used  Substance Use Topics  . Alcohol use: No  . Drug use: No  Review of Systems  Constitutional: Fever Eyes: No visual changes. ENT: No sore throat. Cardiovascular: Denies chest pain. Respiratory: As above Gastrointestinal: No abdominal pain.  No nausea, no vomiting.  No diarrhea.  No constipation. Genitourinary: Negative for dysuria. Musculoskeletal: Negative for back pain. Skin: Negative for rash. Neurological: Negative for headaches, focal weakness or numbness.   ____________________________________________   PHYSICAL EXAM:  VITAL SIGNS: ED Triage Vitals  Enc Vitals Group     BP      Pulse      Resp      Temp      Temp src      SpO2       Weight      Height      Head Circumference      Peak Flow      Pain Score      Pain Loc      Pain Edu?      Excl. in GC?     Constitutional: Alert and oriented.  Appears weak and ill. Eyes: Conjunctivae are normal.  Head: Atraumatic.  Normal TMs bilaterally. Nose: No congestion/rhinnorhea. Mouth/Throat: Mucous membranes are moist.  Neck: No stridor.   Cardiovascular: Tachycardic, regular rhythm. Grossly normal heart sounds.   Respiratory: Normal respiratory effort.  No retractions. Lungs CTAB. Gastrointestinal: Soft and nontender. No distention.  Musculoskeletal: No lower extremity tenderness nor edema.  No joint effusions. Neurologic:  Normal speech and language. No gross focal neurologic deficits are appreciated. Skin:  Skin is warm, dry and intact. No rash noted. Psychiatric: Mood and affect are normal. Speech and behavior are normal.  ____________________________________________   LABS (all labs ordered are listed, but only abnormal results are displayed)  Labs Reviewed  COMPREHENSIVE METABOLIC PANEL - Abnormal; Notable for the following components:      Result Value   Glucose, Bld 126 (*)    Creatinine, Ser 1.16 (*)    Total Protein 8.3 (*)    GFR calc non Af Amer 49 (*)    GFR calc Af Amer 57 (*)    All other components within normal limits  CBC WITH DIFFERENTIAL/PLATELET - Abnormal; Notable for the following components:   Neutro Abs 8.6 (*)    Lymphs Abs 0.5 (*)    All other components within normal limits  URINALYSIS, ROUTINE W REFLEX MICROSCOPIC - Abnormal; Notable for the following components:   Color, Urine YELLOW (*)    APPearance HAZY (*)    Hgb urine dipstick SMALL (*)    Bacteria, UA RARE (*)    Squamous Epithelial / LPF 0-5 (*)    All other components within normal limits  INFLUENZA PANEL BY PCR (TYPE A & B) - Abnormal; Notable for the following components:   Influenza A By PCR POSITIVE (*)    All other components within normal limits  CULTURE,  BLOOD (ROUTINE X 2)  CULTURE, BLOOD (ROUTINE X 2)  URINE CULTURE  LACTIC ACID, PLASMA  LACTIC ACID, PLASMA   ____________________________________________  EKG  ED ECG REPORT I, Arelia Longest, the attending physician, personally viewed and interpreted this ECG.   Date: 01/11/2018  EKG Time: 2059  Rate: 137  Rhythm: sinus tachycardia  Axis: Normal  Intervals:none  ST&T Change: No ST segment elevation or depression.  No abnormal T wave inversion.  ____________________________________________  RADIOLOGY  Increased diffuse bilateral reticular opacity which could reflect infection versus inflammation superimposed on chronic pulmonary fibrosis. ____________________________________________   PROCEDURES  Procedure(s) performed:   .Critical Care Performed by:  Schaevitz, Myra Rudeavid Matthew, MD Authorized by: Myrna BlazerSchaevitz, David Matthew, MD   Critical care provider statement:    Critical care time (minutes):  35   Critical care was necessary to treat or prevent imminent or life-threatening deterioration of the following conditions:  Sepsis   Critical care was time spent personally by me on the following activities:  Ordering and performing treatments and interventions, ordering and review of laboratory studies, ordering and review of radiographic studies, pulse oximetry and re-evaluation of patient's condition    Critical Care performed:   ____________________________________________   INITIAL IMPRESSION / ASSESSMENT AND PLAN / ED COURSE  Pertinent labs & imaging results that were available during my care of the patient were reviewed by me and considered in my medical decision making (see chart for details).  Differential includes, but is not limited to, viral syndrome, bronchitis including COPD exacerbation, pneumonia, reactive airway disease including asthma, CHF including exacerbation with or without pulmonary/interstitial edema, pneumothorax, ACS, thoracic trauma, and  pulmonary embolism. As part of my medical decision making, I reviewed the following data within the electronic MEDICAL RECORD NUMBER Notes from prior ED visits  Sepsis alert called.     ----------------------------------------- 10:02 PM on 01/11/2018 -----------------------------------------  Patient requiring 6 L of nasal cannula oxygen to maintain saturation of 93%.  Reviewed patient's labs from earlier today and flu swabs are negative.  We will give broad-spectrum antibiotics secondary to patient's degree of illness as well as immunocompromise status with sarcoidosis.  She will be admitted to the hospital.  She is understanding of this plan and willing to comply family is also understanding.  Signed out to Dr. Anne HahnWillis.  ____________________________________________   FINAL CLINICAL IMPRESSION(S) / ED DIAGNOSES  Pneumonia.  Sepsis.  Hypoxia.    NEW MEDICATIONS STARTED DURING THIS VISIT:  New Prescriptions   No medications on file     Note:  This document was prepared using Dragon voice recognition software and may include unintentional dictation errors.     Myrna BlazerSchaevitz, David Matthew, MD 01/11/18 2208    Myrna BlazerSchaevitz, David Matthew, MD 01/17/18 [id number]

## 2018-01-11 NOTE — Progress Notes (Signed)
Pharmacy Antibiotic Note  Marissa MiyamotoKatie S Tallarico is a [age] y.o. female admitted on 01/11/2018 with pneumonia.  Pharmacy has been consulted for vancomycin and cefepime dosing.  Plan: DW 52kg  Vd 36L kei o.038 hr-1  T1/2 18 hours Vancomycin 7509 mg q 24 hours ordered with stacked dosing. Level before 5th dose. Goal trough 15-20.  Cefepime 2 grams q 12 hours ordered  Height: 5' (152.4 cm) Weight: 134 lb (60.8 kg) IBW/kg (Calculated) : 45.5  Temp (24hrs), Avg:102.2 F (39 C), Min:100.9 F (38.3 C), Max:103.5 F (39.7 C)  Recent Labs  Lab 01/11/18 2100  WBC 9.5  CREATININE 1.16*  LATICACIDVEN 1.1    Estimated Creatinine Clearance: 41 mL/min (A) (by C-G formula based on SCr of 1.16 mg/dL (H)).    No Known Allergies  Antimicrobials this admission: Vancomycin, cefepime 2/7  >>    >>   Dose adjustments this admission:   Microbiology results: 2/7 BCx: pending 2/7 UCx: pending       2/7 UA: (-) 2/7 CXR: infection vs. inflammation  Thank you for allowing pharmacy to be a part of this patient's care.  Kaisey Huseby S 01/11/2018 10:24 PM

## 2018-01-11 NOTE — ED Notes (Signed)
Pt arrived to ED on 4L [place]. Pt reports she usually wear 1L [place] at home. EMS reports pt had no oxygen on upon their arrival to the home and pt had an oxygen saturation of 80%. Pts oxygen turned down to 2L [place]. Will continue to assess.

## 2018-01-11 NOTE — H&P (Signed)
[facility] -  at [facility]   PATIENT NAME: Marissa ScullKatie Luna    MR#:  [id number]  DATE OF BIRTH:  04/26/1956  DATE OF ADMISSION:  01/11/2018  PRIMARY CARE PHYSICIAN: Carlean JewsBoscia, Heather E, NP   REQUESTING/REFERRING PHYSICIAN: Pershing ProudSchaevitz, MD  CHIEF COMPLAINT:   Chief Complaint  Patient presents with  . Influenza    HISTORY OF PRESENT ILLNESS:  Marissa ScullKatie Luna  is a 62 y.o. female who presents with flulike symptoms.  On evaluation here in the ED she is influenza A positive, she also likely has pneumonia, and meets sepsis criteria.  Hospitalist were called for admission  PAST MEDICAL HISTORY:   Past Medical History:  Diagnosis Date  . Asthma   . Environmental allergies   . Hypertension   . Sarcoidosis     PAST SURGICAL HISTORY:   Past Surgical History:  Procedure Laterality Date  . CHOLECYSTECTOMY    . COLONOSCOPY N/A 10/26/2015   Procedure: COLONOSCOPY;  Surgeon: Wallace CullensPaul Y Oh, MD;  [facility];  Service: Gastroenterology;  Laterality: N/A;  . NO PAST SURGERIES      SOCIAL HISTORY:   Social History   Tobacco Use  . Smoking status: Never Smoker  . Smokeless tobacco: Never Used  Substance Use Topics  . Alcohol use: No    FAMILY HISTORY:   Family History  Problem Relation Age of Onset  . Lung cancer Mother   . Colon cancer Father   . Diabetes Sister     DRUG ALLERGIES:  No Known Allergies  MEDICATIONS AT HOME:   Prior to Admission medications   Medication Sig Start Date End Date Taking? Authorizing Provider  ALPRAZolam (XANAX) 0.25 MG tablet Take 1 tablet (0.25 mg total) by mouth 2 (two) times daily as needed for anxiety. 12/06/17  Yes Boscia, Heather E, NP  amLODipine (NORVASC) 5 MG tablet Take 5 mg by mouth daily.   Yes [provider]  azithromycin (ZITHROMAX) 250 MG tablet z-pack - take as directed for 5 days 01/11/18  Yes Carlean JewsBoscia, Heather E, NP  oseltamivir (TAMIFLU) 75 MG capsule Take 1 capsule (75 mg total) by  mouth 2 (two) times daily. 01/11/18  Yes Carlean JewsBoscia, Heather E, NP  predniSONE (DELTASONE) 5 MG tablet Take 1 tablet (5 mg total) by mouth daily with breakfast. 12/08/17  Yes Boscia, Heather E, NP  PROAIR HFA 108 (90 Base) MCG/ACT inhaler Inhale 2 puffs into the lungs every 4 (four) hours as needed for wheezing or shortness of breath.  11/05/17  Yes [provider]  promethazine-codeine (PHENERGAN WITH CODEINE) 6.25-10 MG/5ML syrup Take 5 mLs by mouth every 8 (eight) hours as needed for cough. 01/11/18  Yes Carlean JewsBoscia, Heather E, NP  tiotropium (SPIRIVA) 18 MCG inhalation capsule Place 18 mcg into inhaler and inhale daily.   Yes [provider]  Vitamin D, Ergocalciferol, (DRISDOL) 50000 units CAPS capsule Take 1 capsule (50,000 Units total) by mouth once a week. Patient taking differently: Take 50,000 Units by mouth once a week. Take on sunday 12/12/17  Yes Carlean JewsBoscia, Heather E, NP  fluticasone furoate-vilanterol (BREO ELLIPTA) 100-25 MCG/INH AEPB  04/18/15   [provider]    REVIEW OF SYSTEMS:  Review of Systems  Constitutional: Positive for chills, fever and malaise/fatigue. Negative for weight loss.  HENT: Negative for ear pain, hearing loss and tinnitus.   Eyes: Negative for blurred vision, double vision, pain and redness.  Respiratory: Positive for cough and shortness of breath. Negative for hemoptysis.  Cardiovascular: Negative for chest pain, palpitations, orthopnea and leg swelling.  Gastrointestinal: Negative for abdominal pain, constipation, diarrhea, nausea and vomiting.  Genitourinary: Negative for dysuria, frequency and hematuria.  Musculoskeletal: Negative for back pain, joint pain and neck pain.  Skin:       No acne, rash, or lesions  Neurological: Negative for dizziness, tremors, focal weakness and weakness.  Endo/Heme/Allergies: Negative for polydipsia. Does not bruise/bleed easily.  Psychiatric/Behavioral: Negative for depression. The patient is not nervous/anxious  and does not have insomnia.      VITAL SIGNS:   Vitals:   01/11/18 2128 01/11/18 2200 01/11/18 2243 01/11/18 2303  BP:  (!) 98/47 (!) 90/53 (!) 87/40  Pulse:  (!) 135 (!) 120 (!) 118  Resp:  20 20 18   Temp:   100 F (37.8 C)   TempSrc:   Oral   SpO2:  (!) 79% 92% 94%  Weight: 60.8 kg (134 lb)     Height: 5' (1.524 m)      Wt Readings from Last 3 Encounters:  01/11/18 60.8 kg (134 lb)  01/11/18 60.8 kg (134 lb)  12/28/16 64.5 kg (142 lb 3.2 oz)    PHYSICAL EXAMINATION:  Physical Exam  Vitals reviewed. Constitutional: She is oriented to person, place, and time. She appears well-developed and well-nourished. No distress.  HENT:  Head: Normocephalic and atraumatic.  Mouth/Throat: Oropharynx is clear and moist.  Eyes: Conjunctivae and EOM are normal. Pupils are equal, round, and reactive to light. No scleral icterus.  Neck: Normal range of motion. Neck supple. No JVD present. No thyromegaly present.  Cardiovascular: Regular rhythm and intact distal pulses. Exam reveals no gallop and no friction rub.  No murmur heard. Tachycardic  Respiratory: Effort normal. No respiratory distress. She has no wheezes. She has rales.  Rhonchi  GI: Soft. Bowel sounds are normal. She exhibits no distension. There is no tenderness.  Musculoskeletal: Normal range of motion. She exhibits no edema.  No arthritis, no gout  Lymphadenopathy:    She has no cervical adenopathy.  Neurological: She is alert and oriented to person, place, and time. No cranial nerve deficit.  No dysarthria, no aphasia  Skin: Skin is warm and dry. No rash noted. No erythema.  Psychiatric: She has a normal mood and affect. Her behavior is normal. Judgment and thought content normal.    LABORATORY PANEL:   CBC Recent Labs  Lab 01/11/18 2100  WBC 9.5  HGB 12.7  HCT 39.6  PLT 271   ------------------------------------------------------------------------------------------------------------------  Chemistries   Recent Labs  Lab 01/11/18 2100  NA 138  K 3.9  CL 102  CO2 29  GLUCOSE 126*  BUN 17  CREATININE 1.16*  CALCIUM 9.0  AST 22  ALT 15  ALKPHOS 68  BILITOT 0.4   ------------------------------------------------------------------------------------------------------------------  Cardiac Enzymes No results for input(s): TROPONINI in the last 168 hours. ------------------------------------------------------------------------------------------------------------------  RADIOLOGY:  Dg Chest 2 View  Result Date: 01/11/2018 CLINICAL DATA:  Cough and fever EXAM: CHEST  2 VIEW COMPARISON:  02/11/2016, 01/28/2015, CT chest 04/22/2010 FINDINGS: Increased bilateral reticular opacity since prior radiograph. No focal consolidation or significant effusion. Stable cardiomediastinal silhouette. No pneumothorax. IMPRESSION: 1. Increased diffuse bilateral reticular opacity, could reflect acute infection or inflammation superimposed on chronic pulmonary fibrosis versus progression of fibrotic lung disease. Electronically Signed   By: Jasmine Pang M.D.   On: 01/11/2018 21:35    EKG:   Orders placed or performed during the hospital encounter of 01/11/18  . ED EKG 12-Lead  .  ED EKG 12-Lead  . EKG 12-Lead  . 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 Management plans discussed with the patient and/or family.  DVT PROPHYLAXIS: SubQ lovenox  GI PROPHYLAXIS: None  ADMISSION STATUS: Inpatient  CODE STATUS: Full Code Status History    This patient does not have a recorded code status. Please  follow your organizational policy for patients in this situation.      TOTAL TIME TAKING CARE OF THIS PATIENT: 45 minutes.   Krish Bailly FIELDING 01/11/2018, 11:57 PM  [facility]  Office  [phone]  CC: Primary care physician; Carlean Jews, NP  Note:  This document was prepared using Dragon voice recognition software and may include unintentional dictation errors.

## 2018-01-11 NOTE — ED Triage Notes (Signed)
PT to ED via EMS from home after reporting worsening general body aches, fatigue and fever. PT reports she was dx with flu earlier in the day at PCP but has not started tamiflu. Last dose of ibuprofen was taken "earlier in the day" but an exact time is unknown. NO NVD reported but cough and congestion with ear and head pain is reported. Pt is normally on 1L [place] at home but EMS reported they found pt n RA with an oxygen saturation of 80%. PT arrived to ED with 4L [place] with oxygen saturation of 94%.

## 2018-01-11 NOTE — Progress Notes (Addendum)
CODE SEPSIS - PHARMACY COMMUNICATION  **Broad Spectrum Antibiotics should be administered within 1 hour of Sepsis diagnosis**  Time Code Sepsis Called/Page Received:  21:30   Antibiotics Ordered: Vanc, Zosyn   Time of 1st antibiotic administration:   Additional action taken by pharmacy:  Called RN @ 22:00, reminded her to give abx, sent cefepime @ 21:45.   If necessary, Name of Provider/Nurse Contacted:     Donavyn Fecher D ,PharmD Clinical Pharmacist  01/11/2018  9:57 PM

## 2018-01-11 NOTE — ED Notes (Signed)
Pt's family at bedside and updated.

## 2018-01-11 NOTE — ED Notes (Signed)
Patient transported to X-ray 

## 2018-01-12 ENCOUNTER — Other Ambulatory Visit: Payer: Self-pay

## 2018-01-12 LAB — CBC
HEMATOCRIT: 36 % (ref 35.0–47.0)
Hemoglobin: 11.5 g/dL — ABNORMAL LOW (ref 12.0–16.0)
MCH: 29.6 pg (ref 26.0–34.0)
MCHC: 31.9 g/dL — ABNORMAL LOW (ref 32.0–36.0)
MCV: 92.7 fL (ref 80.0–100.0)
Platelets: 239 10*3/uL (ref 150–440)
RBC: 3.89 MIL/uL (ref 3.80–5.20)
RDW: 14.8 % — AB (ref 11.5–14.5)
WBC: 9.1 10*3/uL (ref 3.6–11.0)

## 2018-01-12 LAB — BASIC METABOLIC PANEL
ANION GAP: 8 (ref 5–15)
BUN: 14 mg/dL (ref 6–20)
CHLORIDE: 108 mmol/L (ref 101–111)
CO2: 24 mmol/L (ref 22–32)
Calcium: 7.9 mg/dL — ABNORMAL LOW (ref 8.9–10.3)
Creatinine, Ser: 1.04 mg/dL — ABNORMAL HIGH (ref 0.44–1.00)
GFR calc non Af Amer: 56 mL/min — ABNORMAL LOW (ref >60)
Glucose, Bld: 147 mg/dL — ABNORMAL HIGH (ref 65–99)
POTASSIUM: 4.3 mmol/L (ref 3.5–5.1)
SODIUM: 140 mmol/L (ref 135–145)

## 2018-01-12 LAB — PROCALCITONIN: Procalcitonin: 3.03 ng/mL

## 2018-01-12 LAB — MRSA PCR SCREENING: MRSA by PCR: POSITIVE — AB

## 2018-01-12 MED ORDER — FLUTICASONE FUROATE-VILANTEROL 100-25 MCG/INH IN AEPB
1.0000 | INHALATION_SPRAY | Freq: Every day | RESPIRATORY_TRACT | Status: DC
  Administered 2018-01-12 – 2018-01-15 (×4): 1 via RESPIRATORY_TRACT
  Filled 2018-01-12: qty 28

## 2018-01-12 MED ORDER — OSELTAMIVIR PHOSPHATE 30 MG PO CAPS
30.0000 mg | ORAL_CAPSULE | Freq: Two times a day (BID) | ORAL | Status: DC
  Administered 2018-01-12 – 2018-01-15 (×8): 30 mg via ORAL
  Filled 2018-01-12 (×9): qty 1

## 2018-01-12 MED ORDER — ACETAMINOPHEN 650 MG RE SUPP: 650.0000 mg | Freq: Four times a day (QID) | RECTAL | Status: DC | PRN

## 2018-01-12 MED ORDER — CHLORHEXIDINE GLUCONATE CLOTH 2 % EX PADS
6.0000 | MEDICATED_PAD | Freq: Every day | CUTANEOUS | Status: DC
  Administered 2018-01-12 – 2018-01-15 (×4): 6 via TOPICAL

## 2018-01-12 MED ORDER — ENOXAPARIN SODIUM 40 MG/0.4ML ~~LOC~~ SOLN
40.0000 mg | SUBCUTANEOUS | Status: DC
  Administered 2018-01-12 – 2018-01-14 (×3): 40 mg via SUBCUTANEOUS
  Filled 2018-01-12 (×3): qty 0.4

## 2018-01-12 MED ORDER — GUAIFENESIN-DM 100-10 MG/5ML PO SYRP
5.0000 mL | ORAL_SOLUTION | ORAL | Status: DC | PRN
  Administered 2018-01-12 – 2018-01-14 (×4): 5 mL via ORAL
  Filled 2018-01-12 (×5): qty 5

## 2018-01-12 MED ORDER — SODIUM CHLORIDE 0.9 % IV SOLN
INTRAVENOUS | Status: DC
  Administered 2018-01-12: 02:00:00 via INTRAVENOUS

## 2018-01-12 MED ORDER — DEXTROMETHORPHAN-GUAIFENESIN 10-100 MG/5ML PO LIQD
5.0000 mL | ORAL | Status: DC | PRN
  Filled 2018-01-12: qty 5

## 2018-01-12 MED ORDER — PREDNISONE 10 MG PO TABS
5.0000 mg | ORAL_TABLET | Freq: Every day | ORAL | Status: DC
  Administered 2018-01-12: 09:00:00 5 mg via ORAL
  Filled 2018-01-12: qty 1

## 2018-01-12 MED ORDER — BENZONATATE 100 MG PO CAPS: 200.0000 mg | ORAL_CAPSULE | Freq: Three times a day (TID) | ORAL | Status: DC | PRN

## 2018-01-12 MED ORDER — TIOTROPIUM BROMIDE MONOHYDRATE 18 MCG IN CAPS
18.0000 ug | ORAL_CAPSULE | Freq: Every day | RESPIRATORY_TRACT | Status: DC
  Administered 2018-01-12 – 2018-01-15 (×4): 18 ug via RESPIRATORY_TRACT
  Filled 2018-01-12: qty 5

## 2018-01-12 MED ORDER — IPRATROPIUM-ALBUTEROL 0.5-2.5 (3) MG/3ML IN SOLN
3.0000 mL | Freq: Four times a day (QID) | RESPIRATORY_TRACT | Status: DC
  Administered 2018-01-12 – 2018-01-14 (×10): 3 mL via RESPIRATORY_TRACT
  Filled 2018-01-12 (×10): qty 3

## 2018-01-12 MED ORDER — MUPIROCIN 2 % EX OINT
1.0000 "application " | TOPICAL_OINTMENT | Freq: Two times a day (BID) | CUTANEOUS | Status: DC
  Administered 2018-01-12 – 2018-01-15 (×7): 1 via NASAL
  Filled 2018-01-12: qty 22

## 2018-01-12 MED ORDER — IPRATROPIUM-ALBUTEROL 0.5-2.5 (3) MG/3ML IN SOLN: 3.0000 mL | RESPIRATORY_TRACT | Status: DC | PRN

## 2018-01-12 MED ORDER — ONDANSETRON HCL 4 MG/2ML IJ SOLN: 4.0000 mg | Freq: Four times a day (QID) | INTRAMUSCULAR | Status: DC | PRN

## 2018-01-12 MED ORDER — ENSURE ENLIVE PO LIQD
237.0000 mL | Freq: Three times a day (TID) | ORAL | Status: DC
  Administered 2018-01-12 – 2018-01-14 (×4): 237 mL via ORAL

## 2018-01-12 MED ORDER — OSELTAMIVIR PHOSPHATE 75 MG PO CAPS
75.0000 mg | ORAL_CAPSULE | Freq: Two times a day (BID) | ORAL | Status: DC
Start: 2018-01-12 — End: 2018-01-12

## 2018-01-12 MED ORDER — ADULT MULTIVITAMIN W/MINERALS CH
1.0000 | ORAL_TABLET | Freq: Every day | ORAL | Status: DC
  Administered 2018-01-13 – 2018-01-15 (×3): 1 via ORAL
  Filled 2018-01-12 (×3): qty 1

## 2018-01-12 MED ORDER — ALPRAZOLAM 0.25 MG PO TABS: 0.2500 mg | ORAL_TABLET | Freq: Two times a day (BID) | ORAL | Status: DC | PRN

## 2018-01-12 MED ORDER — ACETAMINOPHEN 325 MG PO TABS
650.0000 mg | ORAL_TABLET | Freq: Four times a day (QID) | ORAL | Status: DC | PRN
  Administered 2018-01-12 – 2018-01-13 (×2): 650 mg via ORAL
  Filled 2018-01-12 (×2): qty 2

## 2018-01-12 MED ORDER — ORAL CARE MOUTH RINSE
15.0000 mL | Freq: Two times a day (BID) | OROMUCOSAL | Status: DC
  Administered 2018-01-12 – 2018-01-15 (×3): 15 mL via OROMUCOSAL

## 2018-01-12 MED ORDER — ONDANSETRON HCL 4 MG PO TABS: 4.0000 mg | ORAL_TABLET | Freq: Four times a day (QID) | ORAL | Status: DC | PRN

## 2018-01-12 NOTE — Progress Notes (Signed)
Patient ID: Marissa Luna, female   DOB: 09/22/1956, 62 y.o.   MRN: [id number]  [facility] PROGRESS NOTE  [employer] WUJ:[id number] DOB: Aug 16, 1956 DOA: 01/11/2018 PCP: Carlean Jews, NP  HPI/Subjective: Patient feeling a little bit better.  Still with some cough and shortness of breath and wheeze.  Was not feeling well for a few days.  Found to have positive flu.  Objective: Vitals:   01/12/18 0422 01/12/18 1436  BP: (!) 103/41 (!) 116/42  Pulse: (!) 108 (!) 108  Resp: 17 18  Temp: 99 F (37.2 C) (!) 101.4 F (38.6 C)  SpO2: 93% 95%    Filed Weights   01/11/18 2128 01/12/18 0119  Weight: 60.8 kg (134 lb) 61.1 kg (134 lb 12.8 oz)    ROS: Review of Systems  Constitutional: Negative for chills and fever.  Eyes: Negative for blurred vision.  Respiratory: Positive for cough, shortness of breath and wheezing.   Cardiovascular: Negative for chest pain.  Gastrointestinal: Negative for abdominal pain, constipation, diarrhea, nausea and vomiting.  Genitourinary: Negative for dysuria.  Musculoskeletal: Negative for joint pain.  Neurological: Negative for dizziness and headaches.   Exam: Physical Exam  HENT:  Nose: No mucosal edema.  Mouth/Throat: No oropharyngeal exudate or posterior oropharyngeal edema.  Eyes: Conjunctivae, EOM and lids are normal. Pupils are equal, round, and reactive to light.  Neck: No JVD present. Carotid bruit is not present. No edema present. No thyroid mass and no thyromegaly present.  Cardiovascular: S1 normal and S2 normal. Exam reveals no gallop.  No murmur heard. Pulses:      Dorsalis pedis pulses are 2+ on the right side, and 2+ on the left side.  Respiratory: No respiratory distress. She has decreased breath sounds in the right middle field, the right lower field, the left middle field and the left lower field. She has wheezes in the right middle field. She has rhonchi in the right lower field and the left lower field. She has no rales.   GI: Soft. Bowel sounds are normal. There is no tenderness.  Musculoskeletal:       Right shoulder: She exhibits no swelling.  Lymphadenopathy:    She has no cervical adenopathy.  Neurological: She is alert. No cranial nerve deficit.  Skin: Skin is warm. No rash noted. Nails show no clubbing.  Psychiatric: She has a normal mood and affect.      Data Reviewed: Basic Metabolic Panel: Recent Labs  Lab 01/11/18 2100 01/12/18 0354  NA 138 140  K 3.9 4.3  CL 102 108  CO2 29 24  GLUCOSE 126* 147*  BUN 17 14  CREATININE 1.16* 1.04*  CALCIUM 9.0 7.9*   Liver Function Tests: Recent Labs  Lab 01/11/18 2100  AST 22  ALT 15  ALKPHOS 68  BILITOT 0.4  PROT 8.3*  ALBUMIN 3.7   CBC: Recent Labs  Lab 01/11/18 2100 01/12/18 0354  WBC 9.5 9.1  NEUTROABS 8.6*  --   HGB 12.7 11.5*  HCT 39.6 36.0  MCV 87.2 92.7  PLT 271 239     Recent Results (from the past 240 hour(s))  Blood Culture (routine x 2)     Status: None (Preliminary result)   Collection Time: 01/11/18  8:59 PM  Result Value Ref Range Status   Specimen Description BLOOD RIGHT AC  Final   Special Requests   Final    BOTTLES DRAWN AEROBIC AND ANAEROBIC Blood Culture adequate volume   Culture   Final  NO GROWTH < 12 HOURS Performed at [facility], [address] [place], [place] [address]    Report Status PENDING  Incomplete  Blood Culture (routine x 2)     Status: None (Preliminary result)   Collection Time: 01/11/18  9:00 PM  Result Value Ref Range Status   Specimen Description BLOOD BLOOD RIGHT FOREARM  Final   Special Requests   Final    BOTTLES DRAWN AEROBIC AND ANAEROBIC Blood Culture adequate volume   Culture   Final    NO GROWTH < 12 HOURS Performed at [facility], [address] [place], [place] [address]    Report Status PENDING  Incomplete  MRSA PCR Screening     Status: Abnormal   Collection Time: 01/12/18  9:45 AM  Result Value Ref Range Status   MRSA by PCR  POSITIVE (A) NEGATIVE Final    Comment:        The GeneXpert MRSA Assay (FDA approved for NASAL specimens only), is one component of a comprehensive MRSA colonization surveillance program. It is not intended to diagnose MRSA infection nor to guide or monitor treatment for MRSA infections. RESULT CALLED TO, READ BACK BY AND VERIFIED WITH: [facility] Hospital[facility] PEREZ AT 1142 01/12/18 SDR Performed at [facility], [address] [place], [place] [address]      Studies: Dg Chest 2 View  Result Date: 01/11/2018 CLINICAL DATA:  Cough and fever EXAM: CHEST  2 VIEW COMPARISON:  02/11/2016, 01/28/2015, CT chest 04/22/2010 FINDINGS: Increased bilateral reticular opacity since prior radiograph. No focal consolidation or significant effusion. Stable cardiomediastinal silhouette. No pneumothorax. IMPRESSION: 1. Increased diffuse bilateral reticular opacity, could reflect acute infection or inflammation superimposed on chronic pulmonary fibrosis versus progression of fibrotic lung disease. Electronically Signed   By: Jasmine PangKim  Fujinaga M.D.   On: 01/11/2018 21:35    Scheduled Meds: . Chlorhexidine Gluconate Cloth  6 each Topical Q0600  . enoxaparin (LOVENOX) injection  40 mg Subcutaneous Q24H  . fluticasone furoate-vilanterol  1 puff Inhalation Daily  . ipratropium-albuterol  3 mL Nebulization Q6H  . mouth rinse  15 mL Mouth Rinse BID  . mupirocin ointment  1 application Nasal BID  . oseltamivir  30 mg Oral BID  . predniSONE  5 mg Oral Q breakfast  . tiotropium  18 mcg Inhalation Daily   Continuous Infusions: . ceFEPime (MAXIPIME) IV Stopped (01/12/18 0957)  . vancomycin      Assessment/Plan:  1. Clinical sepsis, influenza a positive.  Unsure if there is a pneumonia complicating this.  Pro-calcitonin is high and chest x-ray could not rule out bibasilar pneumonia.  Vancomycin and cefepime ordered.  Continue Tamiflu. 2. History of sarcoidosis.  Patient on chronic low-dose prednisone.  Nebulizer  treatments ordered.  Family requested Dr. Park BreedKahn pulmonary to see 3. Blood pressure on the lower side.  Holding Norvasc.   Code Status:     Code Status Orders  (From admission, onward)        Start     Ordered   01/12/18 0118  Full code  Continuous     01/12/18 0117    Code Status History    Date Active Date Inactive Code Status Order ID Comments User Context   This patient has a current code status but no historical code status.     Family Communication: Family at the bedside Disposition Plan: To be determined based on clinical course  Consultants:  Pulmonary  Antibiotics:  Vancomycin  Cefepime  Time spent: 28 minutes  Markiesha Delia [employer]

## 2018-01-12 NOTE — Progress Notes (Signed)
   01/12/18 1140  Clinical Encounter Type  Visited With Patient;Patient and family together  Visit Type Initial  Referral From Nurse  Consult/Referral To Chaplain  Spiritual Encounters  Spiritual Needs Prayer   CH completed OR to pray with PT.

## 2018-01-12 NOTE — Plan of Care (Signed)
  Progressing Spiritual Needs Ability to function at adequate level 01/12/2018 1631 - Progressing by Burnett KanarisPerez, Jahzara Slattery N, RN Fluid Volume: Hemodynamic stability will improve 01/12/2018 1631 - Progressing by Burnett KanarisPerez, Yumi Insalaco N, RN Clinical Measurements: Diagnostic test results will improve 01/12/2018 1631 - Progressing by Burnett KanarisPerez, Jaimya Feliciano N, RN Signs and symptoms of infection will decrease 01/12/2018 1631 - Progressing by Burnett KanarisPerez, Faithanne Verret N, RN Respiratory: Ability to maintain adequate ventilation will improve 01/12/2018 1631 - Progressing by Burnett KanarisPerez, Heavenly Christine N, RN Education: Knowledge of General Education information will improve 01/12/2018 1631 - Progressing by Burnett KanarisPerez, Ellieana Dolecki N, RN Safety: Ability to remain free from injury will improve 01/12/2018 1631 - Progressing by Burnett KanarisPerez, Gjon Letarte N, RN

## 2018-01-12 NOTE — Plan of Care (Signed)
  Spiritual Needs Ability to function at adequate level 01/12/2018 0732 - Progressing by Geri SeminoleJackson, Mackena Plummer A, RN   Fluid Volume: Hemodynamic stability will improve 01/12/2018 0732 - Progressing by Geri SeminoleJackson, Zacharee Gaddie A, RN   Clinical Measurements: Diagnostic test results will improve 01/12/2018 0732 - Progressing by Geri SeminoleJackson, Chastin Garlitz A, RN   Respiratory: Ability to maintain adequate ventilation will improve 01/12/2018 0732 - Not Progressing by Geri SeminoleJackson, Krystiana Fornes A, RN   Education: Knowledge of General Education information will improve 01/12/2018 0732 - Progressing by Geri SeminoleJackson, Misaki Sozio A, RN   Safety: Ability to remain free from injury will improve 01/12/2018 0732 - Progressing by Geri SeminoleJackson, Aadarsh Cozort A, RN

## 2018-01-12 NOTE — Progress Notes (Signed)
Initial Nutrition Assessment  DOCUMENTATION CODES:   Not applicable  INTERVENTION:  Provide Ensure Enlive po TID, each supplement provides 350 kcal and 20 grams of protein. Patient prefers strawberry.  Provide daily MVI.  Encouraged adequate intake of calories and protein.  NUTRITION DIAGNOSIS:   Inadequate oral intake related to decreased appetite(son recently passed away, flu) as evidenced by per patient/family report.  GOAL:   Patient will meet greater than or equal to 90% of their needs  MONITOR:   PO intake, Supplement acceptance, Labs, Weight trends, I & O's  REASON FOR ASSESSMENT:   Malnutrition Screening Tool    ASSESSMENT:   62 year old female with PMHx of asthma, HTN, sarcoidosis, hx cholecystectomy now admitted with clinical sepsis and found to be positive for influenza A.   Met with patient at bedside. She reports she has had a poor appetite for the past month now. She reports it is because her son passed away. She has not felt like preparing meals or eating much. She is not able to provide exact details on intake but reports she is eating very little. Denies any N/V, abdominal pain, constipation/diarrhea, difficulty chewing/swallowing. Reports she is not eating well here, either. Amenable to drinking Ensure.  Patient reports she has been losing weight but is not sure how much. Per chart she was 142.2 lbs 12/28/2016 and has lost 7.4 lbs (5.2% body weight) over approximately 3 weeks, which is significant for time frame.  Medications reviewed and include: Tamiflu, prednisone, cefepime, vancomycin.  Labs reviewed: Creatinine 1.04.  Patient does not meet criteria for malnutrition at this time, but is at risk for malnutrition with weight loss.  NUTRITION - FOCUSED PHYSICAL EXAM:    Most Recent Value  Orbital Region  No depletion  Upper Arm Region  Mild depletion  Thoracic and Lumbar Region  No depletion  Buccal Region  No depletion  Temple Region  No depletion   Clavicle Bone Region  No depletion  Clavicle and Acromion Bone Region  No depletion  Scapular Bone Region  No depletion  Dorsal Hand  No depletion  Patellar Region  Unable to assess  Anterior Thigh Region  Unable to assess  Posterior Calf Region  Unable to assess  Edema (RD Assessment)  None  Hair  Reviewed  Eyes  Reviewed  Mouth  Reviewed  Skin  Reviewed  Nails  Reviewed     Diet Order:  Diet regular Room service appropriate? Yes; Fluid consistency: Thin  EDUCATION NEEDS:   No education needs have been identified at this time  Skin:  Skin Assessment: Reviewed RN Assessment  Last BM:  01/12/2018 - small type 5  Height:   Ht Readings from Last 1 Encounters:  01/12/18 5' (1.524 m)    Weight:   Wt Readings from Last 1 Encounters:  01/12/18 134 lb 12.8 oz (61.1 kg)    Ideal Body Weight:  45.5 kg  BMI:  Body mass index is 26.33 kg/m.  Estimated Nutritional Needs:   Kcal:  1530-1830 (25-30 kcal/kg)  Protein:  70-85 grams (1.2-1.4 grams/kg)  Fluid:  1.8 L/day (30 mL/kg)  Willey Blade, MS, RD, LDN Office: [phone] Pager: [phone] After Hours/Weekend Pager: [phone]

## 2018-01-12 NOTE — Progress Notes (Signed)
Dr Renae GlossWieting made aware of MRSA PCR positive result

## 2018-01-12 NOTE — Progress Notes (Signed)
Pharmacy Antibiotic Note  Abigail MiyamotoKatie S Mccaughey is a 62 y.o. female admitted on 01/11/2018 with pneumonia.  Pharmacy has been consulted for vancomycin and cefepime dosing.  Patient is receiving cefepime and vancomycin for PNA and tamiflu for influenza.  Plan: Continue cefepime 2 g IV q12h  Continue vancomycin 750 mg IV q24h Goal VT 15-20 mcg/mL VT ordered 2/10 @ 1500  Height: 5' (152.4 cm) Weight: 134 lb 12.8 oz (61.1 kg) IBW/kg (Calculated) : 45.5  Temp (24hrs), Avg:100.3 F (37.9 C), Min:98.5 F (36.9 C), Max:103.5 F (39.7 C)  Recent Labs  Lab 01/11/18 2100 01/12/18 0354  WBC 9.5 9.1  CREATININE 1.16* 1.04*  LATICACIDVEN 1.1  --     Estimated Creatinine Clearance: 45.8 mL/min (A) (by C-G formula based on SCr of 1.04 mg/dL (H)).    No Known Allergies  Antimicrobials this admission: Vancomycin 2/7 > Cefepime 2/7 > Oseltamivir 2/7 >  Dose adjustments this admission:  Microbiology results: 2/7 BCx: No growth < 12 hours 2/7 UCx: Sent 2/8 MRSA PCR: Positive  Thank you for allowing pharmacy to be a part of this patient's care.  Cindi CarbonMary M Sheri Gatchel, PharmD, BCPS Clinical Pharmacist 01/12/2018 1:38 PM

## 2018-01-13 LAB — PROCALCITONIN: Procalcitonin: 3.65 ng/mL

## 2018-01-13 LAB — URINE CULTURE

## 2018-01-13 LAB — HIV ANTIBODY (ROUTINE TESTING W REFLEX): HIV Screen 4th Generation wRfx: NONREACTIVE

## 2018-01-13 MED ORDER — METHYLPREDNISOLONE SODIUM SUCC 125 MG IJ SOLR
60.0000 mg | INTRAMUSCULAR | Status: DC
  Administered 2018-01-13 – 2018-01-15 (×3): 60 mg via INTRAVENOUS
  Filled 2018-01-13 (×3): qty 2

## 2018-01-13 NOTE — Progress Notes (Signed)
Patient ID: Marissa Luna, female   DOB: 01-30-1956, 62 y.o.   MRN: [id number]  [employer] PROGRESS NOTE  ELIANAH KARIS WUJ:[id number] DOB: 08-16-1956 DOA: 01/11/2018 PCP: Carlean Jews, NP  HPI/Subjective:  Patient feels weak and continues to have shortness of breath, cough and yellow sputum.  No chest pain.   T-max over last 24 hours is 101.3.  Objective: Vitals:   01/13/18 0218 01/13/18 0439  BP:  (!) 92/43  Pulse:  95  Resp:  16  Temp: (!) 101.3 F (38.5 C) 99.7 F (37.6 C)  SpO2:  99%    Filed Weights   01/11/18 2128 01/12/18 0119  Weight: 60.8 kg (134 lb) 61.1 kg (134 lb 12.8 oz)    ROS: Review of Systems  Constitutional: Negative for chills and fever.  Eyes: Negative for blurred vision.  Respiratory: Positive for cough, shortness of breath and wheezing.   Cardiovascular: Negative for chest pain.  Gastrointestinal: Negative for abdominal pain, constipation, diarrhea, nausea and vomiting.  Genitourinary: Negative for dysuria.  Musculoskeletal: Negative for joint pain.  Neurological: Negative for dizziness and headaches.   Exam: Physical Exam  HENT:  Nose: No mucosal edema.  Mouth/Throat: No oropharyngeal exudate or posterior oropharyngeal edema.  Eyes: Conjunctivae, EOM and lids are normal. Pupils are equal, round, and reactive to light.  Neck: No JVD present. Carotid bruit is not present. No edema present. No thyroid mass and no thyromegaly present.  Cardiovascular: S1 normal and S2 normal. Exam reveals no gallop.  No murmur heard. Pulses:      Dorsalis pedis pulses are 2+ on the right side, and 2+ on the left side.  Respiratory: No respiratory distress. She has wheezes. She has rhonchi in the left lower field. She has no rales.  GI: Soft. Bowel sounds are normal. There is no tenderness.  Musculoskeletal:       Right shoulder: She exhibits no swelling.  Lymphadenopathy:    She has no cervical adenopathy.  Neurological: She is alert. No cranial nerve  deficit.  Skin: Skin is warm. No rash noted. Nails show no clubbing.  Psychiatric: She has a normal mood and affect.      Data Reviewed: Basic Metabolic Panel: Recent Labs  Lab 01/11/18 2100 01/12/18 0354  NA 138 140  K 3.9 4.3  CL 102 108  CO2 29 24  GLUCOSE 126* 147*  BUN 17 14  CREATININE 1.16* 1.04*  CALCIUM 9.0 7.9*   Liver Function Tests: Recent Labs  Lab 01/11/18 2100  AST 22  ALT 15  ALKPHOS 68  BILITOT 0.4  PROT 8.3*  ALBUMIN 3.7   CBC: Recent Labs  Lab 01/11/18 2100 01/12/18 0354  WBC 9.5 9.1  NEUTROABS 8.6*  --   HGB 12.7 11.5*  HCT 39.6 36.0  MCV 87.2 92.7  PLT 271 239     Recent Results (from the past 240 hour(s))  Blood Culture (routine x 2)     Status: None (Preliminary result)   Collection Time: 01/11/18  8:59 PM  Result Value Ref Range Status   Specimen Description BLOOD RIGHT [facility]  Final   Special Requests   Final    BOTTLES DRAWN AEROBIC AND ANAEROBIC Blood Culture adequate volume   Culture   Final    NO GROWTH 2 DAYS Performed at [facility], [address] [place], [place] [address]    Report Status PENDING  Incomplete  Blood Culture (routine x 2)     Status: None (Preliminary result)  Collection Time: 01/11/18  9:00 PM  Result Value Ref Range Status   Specimen Description BLOOD BLOOD RIGHT [facility]  Final   Special Requests   Final    BOTTLES DRAWN AEROBIC AND ANAEROBIC Blood Culture adequate volume   Culture   Final    NO GROWTH 2 DAYS Performed at [facility], [address], [place], [place] [address]    Report Status PENDING  Incomplete  Urine culture     Status: Abnormal   Collection Time: 01/11/18  9:00 PM  Result Value Ref Range Status   Specimen Description   Final    URINE, RANDOM Performed at [facility], [address], [place], [place] [address]    Special Requests   Final    NONE Performed at [facility], [address] [place], [place] [address]    Culture  MULTIPLE SPECIES PRESENT, SUGGEST RECOLLECTION (A)  Final   Report Status 01/13/2018 FINAL  Final  MRSA PCR Screening     Status: Abnormal   Collection Time: 01/12/18  9:45 AM  Result Value Ref Range Status   MRSA by PCR POSITIVE (A) NEGATIVE Final    Comment:        The GeneXpert MRSA Assay (FDA approved for NASAL specimens only), is one component of a comprehensive MRSA colonization surveillance program. It is not intended to diagnose MRSA infection nor to guide or monitor treatment for MRSA infections. RESULT CALLED TO, READ BACK BY AND VERIFIED WITH: [facility] PEREZ AT 1142 01/12/18 SDR Performed at [facility], [address], [place], [place] [address]      Studies: Dg Chest 2 View  Result Date: 01/11/2018 CLINICAL DATA:  Cough and fever EXAM: CHEST  2 VIEW COMPARISON:  02/11/2016, 01/28/2015, CT chest 04/22/2010 FINDINGS: Increased bilateral reticular opacity since prior radiograph. No focal consolidation or significant effusion. Stable cardiomediastinal silhouette. No pneumothorax. IMPRESSION: 1. Increased diffuse bilateral reticular opacity, could reflect acute infection or inflammation superimposed on chronic pulmonary fibrosis versus progression of fibrotic lung disease. Electronically Signed   By: Jasmine Pang M.D.   On: 01/11/2018 21:35    Scheduled Meds: . Chlorhexidine Gluconate Cloth  6 each Topical Q0600  . enoxaparin (LOVENOX) injection  40 mg Subcutaneous Q24H  . feeding supplement (ENSURE ENLIVE)  237 mL Oral TID BM  . fluticasone furoate-vilanterol  1 puff Inhalation Daily  . ipratropium-albuterol  3 mL Nebulization Q6H  . mouth rinse  15 mL Mouth Rinse BID  . methylPREDNISolone (SOLU-MEDROL) injection  60 mg Intravenous Q24H  . multivitamin with minerals  1 tablet Oral Daily  . mupirocin ointment  1 application Nasal BID  . oseltamivir  30 mg Oral BID  . tiotropium  18 mcg Inhalation Daily   Continuous Infusions: . ceFEPime (MAXIPIME) IV Stopped  (01/13/18 0920)  . vancomycin Stopped (01/12/18 1632)    Assessment/Plan:  *Influenza a with bilateral pneumonia and sepsis present on admission.  Pro-calcitonin elevated likely bacterial pneumonia.  On Tamiflu, IV antibiotics. IV steroids, nebulizers. Pulmonary consulted.  Waiting for input.  *Acute on chronic hypoxic respiratory failure.  Wean oxygen as tolerated. On 1 L oxygen at home.  *Sarcoidosis.  Stop low-dose prednisone.  Start IV steroids.  *DVT prophylaxis with Lovenox  Code Status:     Code Status Orders  (From admission, onward)        Start     Ordered   01/12/18 0118  Full code  Continuous     01/12/18 0117  Code Status History    Date Active Date Inactive Code Status Order ID Comments User Context   This patient has a current code status but no historical code status.      Disposition Plan: To be determined based on clinical course  Consultants:  Pulmonary  Antibiotics:  Vancomycin  Cefepime  Time spent: 25 minutes  Jasmyn Picha R Junaid Wurzer  [employer]

## 2018-01-13 NOTE — Plan of Care (Signed)
  Progressing Spiritual Needs Ability to function at adequate level 01/13/2018 1725 - Progressing by Burnett KanarisPerez, Makelle Marrone N, RN Fluid Volume: Hemodynamic stability will improve 01/13/2018 1725 - Progressing by Burnett KanarisPerez, Ryson Bacha N, RN Clinical Measurements: Diagnostic test results will improve 01/13/2018 1725 - Progressing by Burnett KanarisPerez, Jearline Hirschhorn N, RN Signs and symptoms of infection will decrease 01/13/2018 1725 - Progressing by Burnett KanarisPerez, Nareg Breighner N, RN Respiratory: Ability to maintain adequate ventilation will improve 01/13/2018 1725 - Progressing by Burnett KanarisPerez, Gyasi Hazzard N, RN Education: Knowledge of General Education information will improve 01/13/2018 1725 - Progressing by Burnett KanarisPerez, Enrica Corliss N, RN Safety: Ability to remain free from injury will improve 01/13/2018 1725 - Progressing by Burnett KanarisPerez, Gal Feldhaus N, RN

## 2018-01-14 LAB — CREATININE, SERUM
CREATININE: 1.05 mg/dL — AB (ref 0.44–1.00)
GFR, EST NON AFRICAN AMERICAN: 56 mL/min — AB (ref >60)

## 2018-01-14 MED ORDER — NYSTATIN 100000 UNIT/ML MT SUSP
5.0000 mL | Freq: Four times a day (QID) | OROMUCOSAL | Status: DC
  Administered 2018-01-15 (×3): 500000 [IU] via OROMUCOSAL
  Filled 2018-01-14 (×3): qty 5

## 2018-01-14 NOTE — Progress Notes (Signed)
Patient ID: Marissa Luna, female   DOB: 1956/06/13, 62 y.o.   MRN: [id number]  [employer] PROGRESS NOTE  PEREL HAUSCHILD WUJ:[id number] DOB: [birth year] DOA: 01/11/2018 PCP: Carlean Jews, NP  HPI/Subjective:  Patient feels weak and continues to have shortness of breath, cough and yellow sputum.  No chest pain.   Afebrile today  Objective: Vitals:   01/14/18 0217 01/14/18 0552  BP:  106/80  Pulse: 93 88  Resp: 18 18  Temp:  98.2 F (36.8 C)  SpO2: 93% 100%    Filed Weights   01/11/18 2128 01/12/18 0119  Weight: 60.8 kg (134 lb) 61.1 kg (134 lb 12.8 oz)    ROS: Review of Systems  Constitutional: Negative for chills and fever.  Eyes: Negative for blurred vision.  Respiratory: Positive for cough, shortness of breath and wheezing.   Cardiovascular: Negative for chest pain.  Gastrointestinal: Negative for abdominal pain, constipation, diarrhea, nausea and vomiting.  Genitourinary: Negative for dysuria.  Musculoskeletal: Negative for joint pain.  Neurological: Negative for dizziness and headaches.   Exam: Physical Exam  HENT:  Nose: No mucosal edema.  Mouth/Throat: No oropharyngeal exudate or posterior oropharyngeal edema.  Eyes: Conjunctivae, EOM and lids are normal. Pupils are equal, round, and reactive to light.  Neck: No JVD present. Carotid bruit is not present. No edema present. No thyroid mass and no thyromegaly present.  Cardiovascular: S1 normal and S2 normal. Exam reveals no gallop.  No murmur heard. Pulses:      Dorsalis pedis pulses are 2+ on the right side, and 2+ on the left side.  Respiratory: No respiratory distress. She has wheezes. She has rhonchi in the left lower field. She has no rales.  GI: Soft. Bowel sounds are normal. There is no tenderness.  Musculoskeletal:       Right shoulder: She exhibits no swelling.  Lymphadenopathy:    She has no cervical adenopathy.  Neurological: She is alert. No cranial nerve deficit.  Skin: Skin is warm. No  rash noted. Nails show no clubbing.  Psychiatric: She has a normal mood and affect.    Data Reviewed: Basic Metabolic Panel: Recent Labs  Lab 01/11/18 2100 01/12/18 0354 01/14/18 0428  NA 138 140  --   K 3.9 4.3  --   CL 102 108  --   CO2 29 24  --   GLUCOSE 126* 147*  --   BUN 17 14  --   CREATININE 1.16* 1.04* 1.05*  CALCIUM 9.0 7.9*  --    Liver Function Tests: Recent Labs  Lab 01/11/18 2100  AST 22  ALT 15  ALKPHOS 68  BILITOT 0.4  PROT 8.3*  ALBUMIN 3.7   CBC: Recent Labs  Lab 01/11/18 2100 01/12/18 0354  WBC 9.5 9.1  NEUTROABS 8.6*  --   HGB 12.7 11.5*  HCT 39.6 36.0  MCV 87.2 92.7  PLT 271 239     Recent Results (from the past 240 hour(s))  Blood Culture (routine x 2)     Status: None (Preliminary result)   Collection Time: 01/11/18  8:59 PM  Result Value Ref Range Status   Specimen Description BLOOD RIGHT [facility]  Final   Special Requests   Final    BOTTLES DRAWN AEROBIC AND ANAEROBIC Blood Culture adequate volume   Culture   Final    NO GROWTH 2 DAYS Performed at [facility], [address], [place], [place] [address]    Report Status PENDING  Incomplete  Blood  Culture (routine x 2)     Status: None (Preliminary result)   Collection Time: 01/11/18  9:00 PM  Result Value Ref Range Status   Specimen Description BLOOD BLOOD RIGHT [facility]  Final   Special Requests   Final    BOTTLES DRAWN AEROBIC AND ANAEROBIC Blood Culture adequate volume   Culture   Final    NO GROWTH 2 DAYS Performed at [facility], [address] [place], [place] [address]    Report Status PENDING  Incomplete  Urine culture     Status: Abnormal   Collection Time: 01/11/18  9:00 PM  Result Value Ref Range Status   Specimen Description   Final    URINE, RANDOM Performed at [facility], [address] [place], [place] [address]    Special Requests   Final    NONE Performed at [facility], [address] [place], [place]  [address]    Culture MULTIPLE SPECIES PRESENT, SUGGEST RECOLLECTION (A)  Final   Report Status 01/13/2018 FINAL  Final  MRSA PCR Screening     Status: Abnormal   Collection Time: 01/12/18  9:45 AM  Result Value Ref Range Status   MRSA by PCR POSITIVE (A) NEGATIVE Final    Comment:        The GeneXpert MRSA Assay (FDA approved for NASAL specimens only), is one component of a comprehensive MRSA colonization surveillance program. It is not intended to diagnose MRSA infection nor to guide or monitor treatment for MRSA infections. RESULT CALLED TO, READ BACK BY AND VERIFIED WITH: [facility] P[facility] AT 1142 01/12/18 SDR Performed at [facility], [address] [place], [place] [address]      Studies: No results found.  Scheduled Meds: . Chlorhexidine Gluconate Cloth  6 each Topical Q0600  . enoxaparin (LOVENOX) injection  40 mg Subcutaneous Q24H  . feeding supplement (ENSURE ENLIVE)  237 mL Oral TID BM  . fluticasone furoate-vilanterol  1 puff Inhalation Daily  . ipratropium-albuterol  3 mL Nebulization Q6H  . mouth rinse  15 mL Mouth Rinse BID  . methylPREDNISolone (SOLU-MEDROL) injection  60 mg Intravenous Q24H  . multivitamin with minerals  1 tablet Oral Daily  . mupirocin ointment  1 application Nasal BID  . oseltamivir  30 mg Oral BID  . tiotropium  18 mcg Inhalation Daily   Continuous Infusions: . ceFEPime (MAXIPIME) IV Stopped (01/14/18 0917)  . vancomycin Stopped (01/13/18 1732)    Assessment/Plan:  *Influenza A with bilateral pneumonia and sepsis present on admission.  Pro-calcitonin elevated likely bacterial pneumonia.  On Tamiflu, IV antibiotics. IV steroids, nebulizers. Pulmonary consulted and discussed with Dr. Meredeth IdeFleming.    *Acute on chronic hypoxic respiratory failure.  Wean oxygen as tolerated. On 3 L oxygen at home.  *Sarcoidosis.  Stopped low-dose prednisone.  Started IV steroids.  *DVT prophylaxis with Lovenox  Discharge likely in AM  Code  Status:     Code Status Orders  (From admission, onward)        Start     Ordered   01/12/18 0118  Full code  Continuous     01/12/18 0117    Code Status History    Date Active Date Inactive Code Status Order ID Comments User Context   This patient has a current code status but no historical code status.      Disposition Plan: To be determined based on clinical course  Consultants:  Pulmonary  Antibiotics:  Vancomycin  Cefepime  Time spent:  25 minutes  Charnel Giles R Karam Dunson  [employer]

## 2018-01-14 NOTE — Progress Notes (Signed)
Date: 01/14/2018,   MRN# [id number] Marissa Luna 07/12/1956 Code Status:     Code Status Orders  (From admission, onward)        Start     Ordered   01/12/18 0118  Full code  Continuous     01/12/18 0117    Code Status History    Date Active Date Inactive Code Status Order ID Comments User Context   This patient has a current code status but no historical code status.     Hosp day:@LENGTHOFSTAYDAYS @ Referring MD: @ATDPROV @      CC: flu, sob, sarcoidosis  HPI: This is a 62 yr old, african american lady.  She came in short of breath, weak, aches, cough. Dx'ed with influenza A infection. She was  Noted to have pneumonia. Also progressive fibrosis, known hx of sarcoidosis x 2 decades. She is on oxygen. She denies peripheral nodes, no edema, no calf pain. No pleurisy, no fever or sweats. No rashes, no cns sxs.   PMHX:   Past Medical History:  Diagnosis Date  . Asthma   . Environmental allergies   . Hypertension   . Sarcoidosis    Surgical Hx:  Past Surgical History:  Procedure Laterality Date  . CHOLECYSTECTOMY    . COLONOSCOPY N/A 10/26/2015   Procedure: COLONOSCOPY;  Surgeon: Wallace Cullens, MD;  Location: [facility] ENDOSCOPY;  Service: Gastroenterology;  Laterality: N/A;  . NO PAST SURGERIES     Family Hx:  Family History  Problem Relation Age of Onset  . Lung cancer Mother   . Colon cancer Father   . Diabetes Sister    Social Hx:   Social History   Tobacco Use  . Smoking status: Never Smoker  . Smokeless tobacco: Never Used  Substance Use Topics  . Alcohol use: No  . Drug use: No   Medication:    Home Medication:    Current Medication: @CURMEDTAB @   Allergies:  Patient has no known allergies.  Review of Systems: Gen:  Denies  fever, sweats, chills HEENT: Denies blurred vision, double vision, ear pain, eye pain, hearing loss, nose bleeds, sore throat Cvc:  No dizziness, chest pain or heaviness Resp: less sob and since being here.     Gi: Denies swallowing  difficulty, stomach pain, nausea or vomiting, diarrhea, constipation, bowel incontinence Gu:  Denies bladder incontinence, burning urine Ext:   No Joint pain, stiffness or swelling Skin: No skin rash, easy bruising or bleeding or hives Endoc:  No polyuria, polydipsia , polyphagia or weight change Psych: No depression, insomnia or hallucinations  Other:  All other systems negative  Physical Examination:   VS: BP 106/80 (BP Location: Right Arm)   Pulse 88   Temp 98.2 F (36.8 C) (Oral)   Resp 18   Ht 5' (1.524 m)   Wt 134 lb 12.8 oz (61.1 kg)   SpO2 95%   BMI 26.33 kg/m   General Appearance: No distress  Neuro: without focal findings, mental status, speech normal, alert and oriented, cranial nerves 2-12 intact, reflexes normal and symmetric, sensation grossly normal  HEENT: PERRLA, EOM intact, no ptosis, no other lesions noticed, Mallampati: Pulmonary:.No wheezing, No rales  Sputum Production:   Cardiovascular:  Normal S1,S2.  No m/r/g.  Abdominal aorta pulsation normal.    Abdomen:Benign, Soft, non-tender, No masses, hepatosplenomegaly, No lymphadenopathy Endoc: No evident thyromegaly, no signs of acromegaly or Cushing features Skin:   warm, no rashes, no ecchymosis  Extremities: normal, no cyanosis, clubbing, no edema, warm  with normal capillary refill. Other findings:   Labs results:   Recent Labs    01/11/18 2100 01/12/18 0354 01/14/18 0428  HGB 12.7 11.5*  --   HCT 39.6 36.0  --   MCV 87.2 92.7  --   WBC 9.5 9.1  --   BUN 17 14  --   CREATININE 1.16* 1.04* 1.05*  GLUCOSE 126* 147*  --   CALCIUM 9.0 7.9*  --   ,      Rad results:  CLINICAL DATA:  Cough and fever  EXAM: CHEST  2 VIEW  COMPARISON:  02/11/2016, 01/28/2015, CT chest 04/22/2010  FINDINGS: Increased bilateral reticular opacity since prior radiograph. No focal consolidation or significant effusion. Stable cardiomediastinal silhouette. No pneumothorax.  IMPRESSION: 1. Increased diffuse  bilateral reticular opacity, could reflect acute infection or inflammation superimposed on chronic pulmonary fibrosis versus progression of fibrotic lung disease.   Electronically Signed   By: Jasmine PangKim  Fujinaga M.D.   On: 01/11/2018 21:35    Assessment and Plan: Infuenza A infection. Improved -tamiflu -supportive care  ? Superimpose pneumonia, ? viral vs bacteria  -change to po meds, levaquin  HX of asthma, no acute bronchospam  -resume pre admission regimen  Sarcoidosis > 20 years, + ve increase fibrosis, appears progressive -ace level -repeat chest ct expected ( out patient) -follow up with Dr. Carolynne EdouardSadaat Khan for continued care -out pfts pfts, six min walk -to consider prednisone, plaquinil -discussed lung transplant if her fibrosis continue to progress    I have personally obtained a history, examined the patient, evaluated laboratory and imaging results, formulated the assessment and plan and placed orders.  The Patient requires high complexity decision making for assessment and support, frequent evaluation and titration of therapies, application of advanced monitoring technologies and extensive interpretation of multiple databases.   Herbon Fleming,M.D. Board certified in Pulmonary & Critical care Medicine [facility]

## 2018-01-14 NOTE — Plan of Care (Signed)
  Progressing Spiritual Needs Ability to function at adequate level 01/14/2018 1451 - Progressing by Burnett KanarisPerez, Estill Llerena N, RN Fluid Volume: Hemodynamic stability will improve 01/14/2018 1451 - Progressing by Burnett KanarisPerez, Iris Tatsch N, RN Clinical Measurements: Diagnostic test results will improve 01/14/2018 1451 - Progressing by Burnett KanarisPerez, Avonna Iribe N, RN Signs and symptoms of infection will decrease 01/14/2018 1451 - Progressing by Burnett KanarisPerez, Jelisa  N, RN Respiratory: Ability to maintain adequate ventilation will improve 01/14/2018 1451 - Progressing by Burnett KanarisPerez, Shawndale Kilpatrick N, RN Education: Knowledge of General Education information will improve 01/14/2018 1451 - Progressing by Burnett KanarisPerez, Duquan Gillooly N, RN Safety: Ability to remain free from injury will improve 01/14/2018 1451 - Progressing by Burnett KanarisPerez, Angeligue Bowne N, RN

## 2018-01-15 ENCOUNTER — Other Ambulatory Visit: Payer: Self-pay

## 2018-01-15 MED ORDER — SODIUM CHLORIDE 0.9 % IV SOLN
2.0000 g | Freq: Two times a day (BID) | INTRAVENOUS | Status: DC
  Filled 2018-01-15 (×3): qty 2

## 2018-01-15 MED ORDER — PREDNISONE 10 MG (21) PO TBPK: ORAL_TABLET | ORAL | 0 refills | Status: DC

## 2018-01-15 MED ORDER — IPRATROPIUM-ALBUTEROL 0.5-2.5 (3) MG/3ML IN SOLN
3.0000 mL | Freq: Three times a day (TID) | RESPIRATORY_TRACT | Status: DC
  Administered 2018-01-15 (×2): 3 mL via RESPIRATORY_TRACT
  Filled 2018-01-15 (×2): qty 3

## 2018-01-15 MED ORDER — IPRATROPIUM-ALBUTEROL 0.5-2.5 (3) MG/3ML IN SOLN: 3.0000 mL | Freq: Four times a day (QID) | RESPIRATORY_TRACT | Status: DC | PRN

## 2018-01-15 MED ORDER — OSELTAMIVIR PHOSPHATE 75 MG PO CAPS: 75.0000 mg | ORAL_CAPSULE | Freq: Two times a day (BID) | ORAL | 0 refills | Status: AC

## 2018-01-15 MED ORDER — LEVOFLOXACIN 500 MG PO TABS: 500.0000 mg | ORAL_TABLET | Freq: Every day | ORAL | 0 refills | Status: DC

## 2018-01-15 NOTE — Progress Notes (Signed)
SATURATION QUALIFICATIONS: (This note is used to comply with regulatory documentation for home oxygen)  Patient Saturations on Room Air at Rest = 93%  Patient Saturations on Room Air while Ambulating = 82%  Patient Saturations on 2 Liters of oxygen while Ambulating = 94%  Please briefly explain why patient needs home oxygen:  

## 2018-01-15 NOTE — Plan of Care (Signed)
  Spiritual Needs Ability to function at adequate level 01/15/2018 0224 - Progressing by Geri SeminoleJackson, Cadyn Fann A, RN   Fluid Volume: Hemodynamic stability will improve 01/15/2018 0224 - Progressing by Geri SeminoleJackson, Danicia Terhaar A, RN   Clinical Measurements: Diagnostic test results will improve 01/15/2018 0224 - Progressing by Geri SeminoleJackson, Ovella Manygoats A, RN Signs and symptoms of infection will decrease 01/15/2018 0224 - Progressing by Geri SeminoleJackson, Carleena Mires A, RN   Respiratory: Ability to maintain adequate ventilation will improve 01/15/2018 0224 - Progressing by Geri SeminoleJackson, Treyvin Glidden A, RN   Education: Knowledge of General Education information will improve 01/15/2018 0224 - Progressing by Geri SeminoleJackson, Isamar Wellbrock A, RN   Safety: Ability to remain free from injury will improve 01/15/2018 0224 - Progressing by Geri SeminoleJackson, Ajiah Mcglinn A, RN

## 2018-01-15 NOTE — Care Management (Signed)
Informed that Ms. Capri needed continuous home oxygen. Ms. Christell ConstantMoore currently receives oxygen from [facility]. [phone]).  [facility]. States they will have [facility] tank [facility] in the next 2 hours. Discharge to home today per Dr. Elpidio AnisSudini. Gwenette GreetBrenda S Robynne Roat RN MSN CCM Care Management [phone]

## 2018-01-16 ENCOUNTER — Ambulatory Visit: Payer: Managed Care, Other (non HMO) | Admitting: Internal Medicine

## 2018-01-16 ENCOUNTER — Encounter: Payer: Self-pay | Admitting: Internal Medicine

## 2018-01-16 VITALS — BP 124/64 | HR 82 | Resp 16 | Ht 60.0 in | Wt 130.8 lb

## 2018-01-16 DIAGNOSIS — J189 Pneumonia, unspecified organism: Secondary | ICD-10-CM | POA: Diagnosis not present

## 2018-01-16 DIAGNOSIS — J1100 Influenza due to unidentified influenza virus with unspecified type of pneumonia: Secondary | ICD-10-CM

## 2018-01-16 DIAGNOSIS — D869 Sarcoidosis, unspecified: Secondary | ICD-10-CM | POA: Diagnosis not present

## 2018-01-16 DIAGNOSIS — J9611 Chronic respiratory failure with hypoxia: Secondary | ICD-10-CM

## 2018-01-16 DIAGNOSIS — J209 Acute bronchitis, unspecified: Secondary | ICD-10-CM | POA: Diagnosis not present

## 2018-01-16 DIAGNOSIS — J440 Chronic obstructive pulmonary disease with acute lower respiratory infection: Secondary | ICD-10-CM | POA: Diagnosis not present

## 2018-01-16 LAB — CULTURE, BLOOD (ROUTINE X 2)
CULTURE: NO GROWTH
Culture: NO GROWTH
SPECIAL REQUESTS: ADEQUATE
Special Requests: ADEQUATE

## 2018-01-16 LAB — ANGIOTENSIN CONVERTING ENZYME: ANGIOTENSIN-CONVERTING ENZYME: 30 U/L (ref 14–82)

## 2018-01-16 NOTE — Patient Instructions (Signed)

## 2018-01-16 NOTE — Progress Notes (Signed)
[facility] [place], [place] [address]  Pulmonary Sleep Medicine  Office Visit Note  Patient Name: Marissa Luna DOB: 1956/06/21 MRN [id number]  Date of Service: 01/16/2018  Complaints/HPI: she was in the hospital for flu. She was admitted to the hospital with pneumonia. Patient was seen by Dr Raul Del who was on call She was treated with abx and tamiflu. She is feeling a little better now. She was very weak on initial presentation. Patient is currently on oxygen at 3lpm. Patient has been on steroids and on a slow taper now. No cough noted at this time no congestion noted at this time  ROS  General: (-) fever, (-) chills, (-) night sweats, (-) weakness Skin: (-) rashes, (-) itching,. Eyes: (-) visual changes, (-) redness, (-) itching. Nose and Sinuses: (-) nasal stuffiness or itchiness, (-) postnasal drip, (-) nosebleeds, (-) sinus trouble. Mouth and Throat: (-) sore throat, (-) hoarseness. Neck: (-) swollen glands, (-) enlarged thyroid, (-) neck pain. Respiratory: - cough, (-) bloody sputum, + shortness of breath, + wheezing. Cardiovascular: - ankle swelling, (-) chest pain. Lymphatic: (-) lymph node enlargement. Neurologic: (-) numbness, (-) tingling. Psychiatric: (-) anxiety, (-) depression   Current Medication: Outpatient Encounter Medications as of 01/16/2018  Medication Sig  . amLODipine (NORVASC) 5 MG tablet Take 5 mg by mouth daily.  . Calcium Carbonate-Vitamin D 600-400 MG-UNIT tablet Take by mouth.  . fluticasone (FLONASE) 50 MCG/ACT nasal spray Place into the nose.  . levofloxacin (LEVAQUIN) 500 MG tablet Take 1 tablet (500 mg total) by mouth daily.  Marland Kitchen oseltamivir (TAMIFLU) 75 MG capsule Take 1 capsule (75 mg total) by mouth 2 (two) times daily for 2 doses.  . predniSONE (STERAPRED UNI-PAK 21 TAB) 10 MG (21) TBPK tablet 6 tab day 1 and taper 1 tab a day- 6 days  . PROAIR HFA 108 (90 Base) MCG/ACT inhaler Inhale 2 puffs into the lungs every 4  (four) hours as needed for wheezing or shortness of breath.   . promethazine-codeine (PHENERGAN WITH CODEINE) 6.25-10 MG/5ML syrup Take 5 mLs by mouth every 8 (eight) hours as needed for cough.  . tiotropium (SPIRIVA) 18 MCG inhalation capsule Place 18 mcg into inhaler and inhale daily.  . TRELEGY ELLIPTA 100-62.5-25 MCG/INH AEPB   . Vitamin D, Ergocalciferol, (DRISDOL) 50000 units CAPS capsule Take 1 capsule (50,000 Units total) by mouth once a week. (Patient taking differently: Take 50,000 Units by mouth once a week. Take on sunday)  . ALBUTEROL SULFATE IN   . ALPRAZolam (XANAX) 0.25 MG tablet Take 1 tablet (0.25 mg total) by mouth 2 (two) times daily as needed for anxiety. (Patient not taking: Reported on 01/16/2018)  . cephALEXin (KEFLEX) 500 MG capsule   . cetirizine (ZYRTEC) 10 MG tablet Take 10 mg by mouth.  . fluticasone furoate-vilanterol (BREO ELLIPTA) 100-25 MCG/INH AEPB   . folic acid (FOLVITE) 1 MG tablet Take 1 mg by mouth.  . predniSONE (DELTASONE) 5 MG tablet Take 1 tablet (5 mg total) by mouth daily with breakfast.  . [DISCONTINUED] azithromycin (ZITHROMAX) 250 MG tablet z-pack - take as directed for 5 days  . [DISCONTINUED] acetaminophen (TYLENOL) suppository 650 mg   . [DISCONTINUED] acetaminophen (TYLENOL) tablet 650 mg   . [DISCONTINUED] ALPRAZolam (XANAX) tablet 0.25 mg   . [DISCONTINUED] benzonatate (TESSALON) capsule 200 mg   . [DISCONTINUED] ceFEPIme (MAXIPIME) 2 g in sodium chloride 0.9 % 100 mL IVPB   . [DISCONTINUED] Chlorhexidine Gluconate Cloth 2 % PADS 6  each   . [DISCONTINUED] enoxaparin (LOVENOX) injection 40 mg   . [DISCONTINUED] feeding supplement (ENSURE ENLIVE) (ENSURE ENLIVE) liquid 237 mL   . [DISCONTINUED] fluticasone furoate-vilanterol (BREO ELLIPTA) 100-25 MCG/INH 1 puff   . [DISCONTINUED] guaiFENesin-dextromethorphan (ROBITUSSIN DM) 100-10 MG/5ML syrup 5 mL   . [DISCONTINUED] ipratropium-albuterol (DUONEB) 0.5-2.5 (3) MG/3ML nebulizer solution 3 mL    . [DISCONTINUED] ipratropium-albuterol (DUONEB) 0.5-2.5 (3) MG/3ML nebulizer solution 3 mL   . [DISCONTINUED] MEDLINE mouth rinse   . [DISCONTINUED] methylPREDNISolone sodium succinate (SOLU-MEDROL) 125 mg/2 mL injection 60 mg   . [DISCONTINUED] multivitamin with minerals tablet 1 tablet   . [DISCONTINUED] mupirocin ointment (BACTROBAN) 2 % 1 application   . [DISCONTINUED] nystatin (MYCOSTATIN) 100000 UNIT/ML suspension 500,000 Units   . [DISCONTINUED] ondansetron (ZOFRAN) injection 4 mg   . [DISCONTINUED] ondansetron (ZOFRAN) tablet 4 mg   . [DISCONTINUED] oseltamivir (TAMIFLU) capsule 30 mg   . [DISCONTINUED] tiotropium (SPIRIVA) inhalation capsule 18 mcg   . [DISCONTINUED] vancomycin (VANCOCIN) IVPB 750 mg/150 ml premix    No facility-administered encounter medications on file as of 01/16/2018.     Surgical History: Past Surgical History:  Procedure Laterality Date  . CHOLECYSTECTOMY    . COLONOSCOPY N/A 10/26/2015   Procedure: COLONOSCOPY;  Surgeon: Hulen Luster, MD;  Location: [facility];  Service: Gastroenterology;  Laterality: N/A;    Medical History: Past Medical History:  Diagnosis Date  . Asthma   . Environmental allergies   . Hypertension   . Sarcoidosis     Family History: Family History  Problem Relation Age of Onset  . Lung cancer Mother   . Colon cancer Father   . Diabetes Sister     Social History: Social History   Socioeconomic History  . Marital status: Married    Spouse name: Not on file  . Number of children: Not on file  . Years of education: Not on file  . Highest education level: Not on file  Social Needs  . Financial resource strain: Not on file  . Food insecurity - worry: Not on file  . Food insecurity - inability: Not on file  . Transportation needs - medical: Not on file  . Transportation needs - non-medical: Not on file  Occupational History  . Not on file  Tobacco Use  . Smoking status: Never Smoker  . Smokeless tobacco: Never  Used  Substance and Sexual Activity  . Alcohol use: No  . Drug use: No  . Sexual activity: Not on file  Other Topics Concern  . Not on file  Social History Narrative  . Not on file    Vital Signs: Blood pressure 124/64, pulse 82, resp. rate 16, height 5' (1.524 m), weight 130 lb 12.8 oz (59.3 kg), SpO2 93 %.  Examination: General Appearance: The patient is well-developed, well-nourished, and in no distress. Skin: Gross inspection of skin unremarkable. Head: normocephalic, no gross deformities. Eyes: no gross deformities noted. ENT: ears appear grossly normal no exudates. Neck: Supple. No thyromegaly. No LAD. Respiratory: scattered rhonchi. Cardiovascular: Normal S1 and S2 without murmur or rub. Extremities: No cyanosis. pulses are equal. Neurologic: Alert and oriented. No involuntary movements.  LABS: Recent Results (from the past 2160 hour(s))  POCT Influenza A/B     Status: None   Collection Time: 01/11/18 12:31 PM  Result Value Ref Range   Influenza A, POC Negative Negative   Influenza B, POC Negative Negative  Blood Culture (routine x 2)     Status: None  Collection Time: 01/11/18  8:59 PM  Result Value Ref Range   Specimen Description BLOOD RIGHT AC    Special Requests      BOTTLES DRAWN AEROBIC AND ANAEROBIC Blood Culture adequate volume   Culture      NO GROWTH 5 DAYS Performed at [facility], [address] [place], [place] [address]    Report Status 01/16/2018 FINAL   Comprehensive metabolic panel     Status: Abnormal   Collection Time: 01/11/18  9:00 PM  Result Value Ref Range   Sodium 138 135 - 145 mmol/L   Potassium 3.9 3.5 - 5.1 mmol/L   Chloride 102 101 - 111 mmol/L   CO2 29 22 - 32 mmol/L   Glucose, Bld 126 (H) 65 - 99 mg/dL   BUN 17 6 - 20 mg/dL   Creatinine, Ser 1.16 (H) 0.44 - 1.00 mg/dL   Calcium 9.0 8.9 - 10.3 mg/dL   Total Protein 8.3 (H) 6.5 - 8.1 g/dL   Albumin 3.7 3.5 - 5.0 g/dL   AST 22 15 - 41 U/L   ALT 15 14 - 54 U/L    Alkaline Phosphatase 68 38 - 126 U/L   Total Bilirubin 0.4 0.3 - 1.2 mg/dL   GFR calc non Af Amer 49 (L) >60 mL/min   GFR calc Af Amer 57 (L) >60 mL/min    Comment: (NOTE) The eGFR has been calculated using the CKD EPI equation. This calculation has not been validated in all clinical situations. eGFR's persistently <60 mL/min signify possible Chronic Kidney Disease.    Anion gap 7 5 - 15    Comment: Performed at [facility], [address] [place], [place] [address]  CBC WITH DIFFERENTIAL     Status: Abnormal   Collection Time: 01/11/18  9:00 PM  Result Value Ref Range   WBC 9.5 3.6 - 11.0 K/uL   RBC 4.54 3.80 - 5.20 MIL/uL   Hemoglobin 12.7 12.0 - 16.0 g/dL   HCT 39.6 35.0 - 47.0 %   MCV 87.2 80.0 - 100.0 fL   MCH 28.0 26.0 - 34.0 pg   MCHC 32.1 32.0 - 36.0 g/dL   RDW 13.7 11.5 - 14.5 %   Platelets 271 150 - 440 K/uL   Neutrophils Relative % 91 %   Neutro Abs 8.6 (H) 1.4 - 6.5 K/uL   Lymphocytes Relative 5 %   Lymphs Abs 0.5 (L) 1.0 - 3.6 K/uL   Monocytes Relative 4 %   Monocytes Absolute 0.3 0.2 - 0.9 K/uL   Eosinophils Relative 0 %   Eosinophils Absolute 0.0 0 - 0.7 K/uL   Basophils Relative 0 %   Basophils Absolute 0.0 0 - 0.1 K/uL    Comment: Performed at [facility], [address] [place], [place] [address]  Blood Culture (routine x 2)     Status: None   Collection Time: 01/11/18  9:00 PM  Result Value Ref Range   Specimen Description BLOOD BLOOD RIGHT FOREARM    Special Requests      BOTTLES DRAWN AEROBIC AND ANAEROBIC Blood Culture adequate volume   Culture      NO GROWTH 5 DAYS Performed at [facility], [address] [place], [place] [address]    Report Status 01/16/2018 FINAL   Urinalysis, Routine w reflex microscopic     Status: Abnormal   Collection Time: 01/11/18  9:00 PM  Result Value Ref Range   Color, Urine YELLOW (A) YELLOW  APPearance HAZY (A) CLEAR   Specific Gravity, Urine 1.010 1.005 - 1.030   pH 6.0 5.0 -  8.0   Glucose, UA NEGATIVE NEGATIVE mg/dL   Hgb urine dipstick SMALL (A) NEGATIVE   Bilirubin Urine NEGATIVE NEGATIVE   Ketones, ur NEGATIVE NEGATIVE mg/dL   Protein, ur NEGATIVE NEGATIVE mg/dL   Nitrite NEGATIVE NEGATIVE   Leukocytes, UA NEGATIVE NEGATIVE   RBC / HPF 0-5 0 - 5 RBC/hpf   WBC, UA 0-5 0 - 5 WBC/hpf   Bacteria, UA RARE (A) NONE SEEN   Squamous Epithelial / LPF 0-5 (A) NONE SEEN   Mucus PRESENT     Comment: Performed at [facility], [address] [place], [place] [address]  Lactic acid, plasma     Status: None   Collection Time: 01/11/18  9:00 PM  Result Value Ref Range   Lactic Acid, Venous 1.1 0.5 - 1.9 mmol/L    Comment: Performed at [facility], [address], [place], [place] [address]  Urine culture     Status: Abnormal   Collection Time: 01/11/18  9:00 PM  Result Value Ref Range   Specimen Description      URINE, RANDOM Performed at [facility], [address], [place], [place] [address]    Special Requests      NONE Performed at [facility], [address], [place], [place] [address]    Culture MULTIPLE SPECIES PRESENT, SUGGEST RECOLLECTION (A)    Report Status 01/13/2018 FINAL   Influenza panel by PCR (type A & B)     Status: Abnormal   Collection Time: 01/11/18  9:00 PM  Result Value Ref Range   Influenza A By PCR POSITIVE (A) NEGATIVE   Influenza B By PCR NEGATIVE NEGATIVE    Comment: (NOTE) The Xpert Xpress Flu assay is intended as an aid in the diagnosis of  influenza and should not be used as a sole basis for treatment.  This  assay is FDA approved for nasopharyngeal swab specimens only. Nasal  washings and aspirates are unacceptable for Xpert Xpress Flu testing. Performed at [facility], [address] [place], [place] [address]   HIV antibody (Routine Testing)     Status: None   Collection Time: 01/12/18  3:54 AM  Result Value Ref Range   HIV Screen 4th Generation wRfx Non Reactive  Non Reactive    Comment: (NOTE) Performed At: [facility] [place], [place] [id number] Rush Farmer MD [id number] Performed at [facility], [address] [place],  [address]   Basic metabolic panel     Status: Abnormal   Collection Time: 01/12/18  3:54 AM  Result Value Ref Range   Sodium 140 135 - 145 mmol/L   Potassium 4.3 3.5 - 5.1 mmol/L   Chloride 108 101 - 111 mmol/L   CO2 24 22 - 32 mmol/L   Glucose, Bld 147 (H) 65 - 99 mg/dL   BUN 14 6 - 20 mg/dL   Creatinine, Ser 1.04 (H) 0.44 - 1.00 mg/dL   Calcium 7.9 (L) 8.9 - 10.3 mg/dL   GFR calc non Af Amer 56 (L) >60 mL/min   GFR calc Af Amer >60 >60 mL/min    Comment: (NOTE) The eGFR has been calculated using the CKD EPI equation. This calculation has not been validated in all clinical situations. eGFR's persistently <60 mL/min signify possible Chronic Kidney Disease.    Anion gap 8 5 - 15  Comment: Performed at [facility], [address] [place], [place] [address]  CBC     Status: Abnormal   Collection Time: 01/12/18  3:54 AM  Result Value Ref Range   WBC 9.1 3.6 - 11.0 K/uL   RBC 3.89 3.80 - 5.20 MIL/uL   Hemoglobin 11.5 (L) 12.0 - 16.0 g/dL   HCT 36.0 35.0 - 47.0 %   MCV 92.7 80.0 - 100.0 fL   MCH 29.6 26.0 - 34.0 pg   MCHC 31.9 (L) 32.0 - 36.0 g/dL   RDW 14.8 (H) 11.5 - 14.5 %   Platelets 239 150 - 440 K/uL    Comment: Performed at [facility], [address] [place], [place] [address]  Procalcitonin - Baseline     Status: None   Collection Time: 01/12/18  3:54 AM  Result Value Ref Range   Procalcitonin 3.03 ng/mL    Comment:        Interpretation: PCT > 2 ng/mL: Systemic infection (sepsis) is likely, unless other causes are known. (NOTE)       Sepsis PCT Algorithm           Lower Respiratory Tract                                      Infection PCT Algorithm    ----------------------------     ----------------------------         PCT <  0.25 ng/mL                PCT < 0.10 ng/mL         Strongly encourage             Strongly discourage   discontinuation of antibiotics    initiation of antibiotics    ----------------------------     -----------------------------       PCT 0.25 - 0.50 ng/mL            PCT 0.10 - 0.25 ng/mL               OR       >80% decrease in PCT            Discourage initiation of                                            antibiotics      Encourage discontinuation           of antibiotics    ----------------------------     -----------------------------         PCT >= 0.50 ng/mL              PCT 0.26 - 0.50 ng/mL               AND       <80% decrease in PCT              Encourage initiation of                                             antibiotics       Encourage continuation           of  antibiotics    ----------------------------     -----------------------------        PCT >= 0.50 ng/mL                  PCT > 0.50 ng/mL               AND         increase in PCT                  Strongly encourage                                      initiation of antibiotics    Strongly encourage escalation           of antibiotics                                     -----------------------------                                           PCT <= 0.25 ng/mL                                                 OR                                        > 80% decrease in PCT                                     Discontinue / Do not initiate                                             antibiotics Performed at [facility], [address] [place], [place] [address]   MRSA PCR Screening     Status: Abnormal   Collection Time: 01/12/18  9:45 AM  Result Value Ref Range   MRSA by PCR POSITIVE (A) NEGATIVE    Comment:        The GeneXpert MRSA Assay (FDA approved for NASAL specimens only), is one component of a comprehensive MRSA colonization surveillance program. It is not intended to diagnose  MRSA infection nor to guide or monitor treatment for MRSA infections. RESULT CALLED TO, READ BACK BY AND VERIFIED WITH: [facility] [place] PEREZ AT 1142 01/12/18 SDR Performed at [facility], [address] [place], [place] [address]   Procalcitonin     Status: None   Collection Time: 01/13/18  5:04 AM  Result Value Ref Range   Procalcitonin 3.65 ng/mL    Comment:        Interpretation: PCT > 2 ng/mL: Systemic infection (sepsis) is likely, unless other causes are known. (NOTE)       Sepsis PCT Algorithm  Lower Respiratory Tract                                      Infection PCT Algorithm    ----------------------------     ----------------------------         PCT < 0.25 ng/mL                PCT < 0.10 ng/mL         Strongly encourage             Strongly discourage   discontinuation of antibiotics    initiation of antibiotics    ----------------------------     -----------------------------       PCT 0.25 - 0.50 ng/mL            PCT 0.10 - 0.25 ng/mL               OR       >80% decrease in PCT            Discourage initiation of                                            antibiotics      Encourage discontinuation           of antibiotics    ----------------------------     -----------------------------         PCT >= 0.50 ng/mL              PCT 0.26 - 0.50 ng/mL               AND       <80% decrease in PCT              Encourage initiation of                                             antibiotics       Encourage continuation           of antibiotics    ----------------------------     -----------------------------        PCT >= 0.50 ng/mL                  PCT > 0.50 ng/mL               AND         increase in PCT                  Strongly encourage                                      initiation of antibiotics    Strongly encourage escalation           of antibiotics                                     -----------------------------  PCT <= 0.25 ng/mL                                                 OR                                        > 80% decrease in PCT                                     Discontinue / Do not initiate                                             antibiotics Performed at [facility], [address] [place], [place] [address]   Creatinine, serum     Status: Abnormal   Collection Time: 01/14/18  4:28 AM  Result Value Ref Range   Creatinine, Ser 1.05 (H) 0.44 - 1.00 mg/dL   GFR calc non Af Amer 56 (L) >60 mL/min   GFR calc Af Amer >60 >60 mL/min    Comment: (NOTE) The eGFR has been calculated using the CKD EPI equation. This calculation has not been validated in all clinical situations. eGFR's persistently <60 mL/min signify possible Chronic Kidney Disease. Performed at [facility], [address] [place], [place] [address]   Angiotensin converting enzyme     Status: None   Collection Time: 01/14/18  4:29 AM  Result Value Ref Range   Angiotensin-Converting Enzyme 30 14 - 82 U/L    Comment: (NOTE) Performed At: [facility] [place], [place] [id number] Rush Farmer MD [id number] Performed at [facility], [address] [place], [place] [address]     Radiology: Dg Chest 2 View  Result Date: 01/11/2018 CLINICAL DATA:  Cough and fever EXAM: CHEST  2 VIEW COMPARISON:  02/11/2016, 01/28/2015, CT chest 04/22/2010 FINDINGS: Increased bilateral reticular opacity since prior radiograph. No focal consolidation or significant effusion. Stable cardiomediastinal silhouette. No pneumothorax. IMPRESSION: 1. Increased diffuse bilateral reticular opacity, could reflect acute infection or inflammation superimposed on chronic pulmonary fibrosis versus progression of fibrotic lung disease. Electronically Signed   By: Donavan Foil M.D.   On: 01/11/2018 21:35    No results found.  Dg Chest 2 View  Result Date: 01/11/2018 CLINICAL  DATA:  Cough and fever EXAM: CHEST  2 VIEW COMPARISON:  02/11/2016, 01/28/2015, CT chest 04/22/2010 FINDINGS: Increased bilateral reticular opacity since prior radiograph. No focal consolidation or significant effusion. Stable cardiomediastinal silhouette. No pneumothorax. IMPRESSION: 1. Increased diffuse bilateral reticular opacity, could reflect acute infection or inflammation superimposed on chronic pulmonary fibrosis versus progression of fibrotic lung disease. Electronically Signed   By: Donavan Foil M.D.   On: 01/11/2018 21:35      Assessment and Plan: Patient Active Problem List   Diagnosis Date Noted  . Sepsis ([place]) 01/11/2018  . CAP (community acquired pneumonia) 01/11/2018  . Influenza A 01/11/2018  . 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 Hypoxemia 01/08/2018  . Allergic rhinitis, unspecified 01/08/2018  . Essential (primary) hypertension 01/08/2018  . Sarcoidosis of skin  01/08/2018  . Shortness of breath 01/08/2018    1. Influenza clinically doing better now. She is on the abx at this time and tamiflu 2. CAP she will need a CXR follow up for resolution 3. Sarcoid on steroids has been fairly stable 4. COPD will continue with present therapy on inhalers trelegy 5. Chronic respiratory failure with hypoxia on oxygen therapy encouraged ongoing compliance  General Counseling: I have discussed the findings of the evaluation and examination with Rosangelica.  I have also discussed any further diagnostic evaluation thatmay be needed or ordered today. Takeria verbalizes understanding of the findings of todays visit. We also reviewed her medications today and discussed drug interactions and side effects including but not limited excessive drowsiness and altered mental states. We also discussed that there is always a risk not just to her but also people around her. she has been encouraged to call the office with any questions or concerns that should arise related to todays visit.    Time  spent: 27mn  I have personally obtained a history, examined the patient, evaluated laboratory and imaging results, formulated the assessment and plan and placed orders.    SAllyne Gee MD [facility] Sleep [facility]

## 2018-01-19 NOTE — Discharge Summary (Signed)
[facility] [place] [facility]   PATIENT NAME: Marissa Luna    MR#:  [id number]  DATE OF BIRTH:  12/02/1956  DATE OF ADMISSION:  01/11/2018 ADMITTING PHYSICIAN: Oralia Manisavid Willis, MD  DATE OF DISCHARGE: 01/15/2018  5:15 PM  PRIMARY CARE PHYSICIAN: Carlean JewsBoscia, Heather E, NP   ADMISSION DIAGNOSIS:  Hypoxia [R09.02] Sepsis, due to unspecified organism (HCC) [A41.9] Multifocal pneumonia [J18.9]  DISCHARGE DIAGNOSIS:  Principal Problem:   Sepsis (HCC) Active Problems:   Sarcoidosis of lung (HCC)   Essential (primary) hypertension   CAP (community acquired pneumonia)   Influenza A   SECONDARY DIAGNOSIS:   Past Medical History:  Diagnosis Date  . Asthma   . Environmental allergies   . Hypertension   . Sarcoidosis      ADMITTING HISTORY  HISTORY OF PRESENT ILLNESS:  Marissa ScullKatie Falck  is a 62 y.o. female who presents with flulike symptoms.  On evaluation here in the ED she is influenza A positive, she also likely has pneumonia, and meets sepsis criteria.  Hospitalist were called for admission  HOSPITAL COURSE:   *Influenza A *Bilateral pneumonia *Sepsis present on admission *Acute on chronic hypoxic respiratory failure *Sarcoidosis  Patient was admitted on to medical floor and started on Tamiflu and IV antibiotics.  Her oral prednisone from home was stopped and changed to IV Solu-Medrol in the hospital.  Scheduled nebulizers.  Pro-calcitonin was elevated.  Patient initially had significant shortness of breath and wheezing needing increased oxygen requirements.  By the day of discharge she is feeling close to normal.  Has very mild expiratory wheezes.  Has ambulated independently.  Patient is being discharged home with Tamiflu, Levaquin and prednisone taper to follow-up with her pulmonologist Dr. Lennette BihariKohn as outpatient.  Stable for discharge home.  CONSULTS OBTAINED:  Treatment Team:  Yevonne PaxKhan, Saadat A, MD Mertie MooresFleming, Herbon E, MD  DRUG ALLERGIES:  No Known  Allergies  DISCHARGE MEDICATIONS:   Allergies as of 01/15/2018   No Known Allergies     Medication List    STOP taking these medications   azithromycin 250 MG tablet Commonly known as:  ZITHROMAX   oseltamivir 75 MG capsule Commonly known as:  TAMIFLU     TAKE these medications   ALPRAZolam 0.25 MG tablet Commonly known as:  XANAX Take 1 tablet (0.25 mg total) by mouth 2 (two) times daily as needed for anxiety.   amLODipine 5 MG tablet Commonly known as:  NORVASC Take 5 mg by mouth daily.   BREO ELLIPTA 100-25 MCG/INH Aepb Generic drug:  fluticasone furoate-vilanterol   levofloxacin 500 MG tablet Commonly known as:  LEVAQUIN Take 1 tablet (500 mg total) by mouth daily.   predniSONE 5 MG tablet Commonly known as:  DELTASONE Take 1 tablet (5 mg total) by mouth daily with breakfast. What changed:  Another medication with the same name was added. Make sure you understand how and when to take each.   predniSONE 10 MG (21) Tbpk tablet Commonly known as:  STERAPRED UNI-PAK 21 TAB 6 tab day 1 and taper 1 tab a day- 6 days What changed:  You were already taking a medication with the same name, and this prescription was added. Make sure you understand how and when to take each.   PROAIR HFA 108 (90 Base) MCG/ACT inhaler Generic drug:  albuterol Inhale 2 puffs into the lungs every 4 (four) hours as needed for wheezing or shortness of breath.   promethazine-codeine 6.25-10 MG/5ML syrup Commonly known as:  PHENERGAN  with CODEINE Take 5 mLs by mouth every 8 (eight) hours as needed for cough.   tiotropium 18 MCG inhalation capsule Commonly known as:  SPIRIVA Place 18 mcg into inhaler and inhale daily.   Vitamin D (Ergocalciferol) 50000 units Caps capsule Commonly known as:  DRISDOL Take 1 capsule (50,000 Units total) by mouth once a week. What changed:  additional instructions     ASK your doctor about these medications   oseltamivir 75 MG capsule Commonly known as:   TAMIFLU Take 1 capsule (75 mg total) by mouth 2 (two) times daily for 2 doses. Ask about: Should I take this medication?       Today   VITAL SIGNS:  Blood pressure (!) 118/54, pulse 90, temperature 97.9 F (36.6 C), temperature source Oral, resp. rate 14, height 5' (1.524 m), weight 61.1 kg (134 lb 12.8 oz), SpO2 99 %.  I/O:  No intake or output data in the 24 hours ending 01/19/18 1532  PHYSICAL EXAMINATION:  Physical Exam  GENERAL:  62 y.o.-year-old patient lying in the bed with no acute distress.  LUNGS: Normal breath sounds bilaterally, no wheezing, rales,rhonchi or crepitation. No use of accessory muscles of respiration.  CARDIOVASCULAR: S1, S2 normal. No murmurs, rubs, or gallops.  ABDOMEN: Soft, non-tender, non-distended. Bowel sounds present. No organomegaly or mass.  NEUROLOGIC: Moves all 4 extremities. PSYCHIATRIC: The patient is alert and oriented x 3.  SKIN: No obvious rash, lesion, or ulcer.   DATA REVIEW:   CBC No results for input(s): WBC, HGB, HCT, PLT in the last 168 hours.  Chemistries  Recent Labs  Lab 01/14/18 0428  CREATININE 1.05*    Cardiac Enzymes No results for input(s): TROPONINI in the last 168 hours.  Microbiology Results  Results for orders placed or performed during the hospital encounter of 01/11/18  Blood Culture (routine x 2)     Status: None   Collection Time: 01/11/18  8:59 PM  Result Value Ref Range Status   Specimen Description BLOOD RIGHT [facility]  Final   Special Requests   Final    BOTTLES DRAWN AEROBIC AND ANAEROBIC Blood Culture adequate volume   Culture   Final    NO GROWTH 5 DAYS Performed at [facility], [address] [place], [place] [address]    Report Status 01/16/2018 FINAL  Final  Blood Culture (routine x 2)     Status: None   Collection Time: 01/11/18  9:00 PM  Result Value Ref Range Status   Specimen Description BLOOD BLOOD RIGHT FOREARM  Final   Special Requests   Final    BOTTLES DRAWN AEROBIC AND  ANAEROBIC Blood Culture adequate volume   Culture   Final    NO GROWTH 5 DAYS Performed at [facility], [address] [place], [place] [address]    Report Status 01/16/2018 FINAL  Final  Urine culture     Status: Abnormal   Collection Time: 01/11/18  9:00 PM  Result Value Ref Range Status   Specimen Description   Final    URINE, RANDOM Performed at [facility], [address], [place], [place] [address]    Special Requests   Final    NONE Performed at [facility], [address] [place], [place] [address]    Culture MULTIPLE SPECIES PRESENT, SUGGEST RECOLLECTION (A)  Final   Report Status 01/13/2018 FINAL  Final  MRSA PCR Screening     Status: Abnormal   Collection Time: 01/12/18  9:45 AM  Result Value Ref Range Status   MRSA by PCR POSITIVE (A) NEGATIVE Final    Comment:        The GeneXpert MRSA Assay (FDA approved for NASAL specimens only), is one component of a comprehensive MRSA colonization surveillance program. It is not intended to diagnose MRSA infection nor to guide or monitor treatment for MRSA infections. RESULT CALLED TO, READ BACK BY AND VERIFIED WITH: [facility] PEREZ AT 1142 01/12/18 SDR Performed at [facility], [address], [place], [place] [address]     RADIOLOGY:  No results found.  Follow up with PCP in 1 week.  Management plans discussed with the patient, family and they are in agreement.  CODE STATUS:  Code Status History    Date Active Date Inactive Code Status Order ID Comments User Context   01/12/2018 01:18 01/15/2018 20:21 Full Code [id number]  Oralia Manis, MD Inpatient      TOTAL TIME TAKING CARE OF THIS PATIENT ON DAY OF DISCHARGE: more than 30 minutes.   Orie Fisherman M.D on 01/19/2018 at 3:32 PM  Between 7am to 6pm - Pager - [phone]  After 6pm go to [email or web address] - password [email or web address]  [facility] [place] [place]  Office  [phone]  CC: Primary care physician; Carlean Jews, NP  Note: This dictation was prepared with Dragon dictation along with smaller phrase technology. Any transcriptional errors that result from this process are unintentional.

## 2018-02-02 ENCOUNTER — Ambulatory Visit: Payer: Managed Care, Other (non HMO) | Admitting: Nurse Practitioner

## 2018-02-02 ENCOUNTER — Encounter: Payer: Self-pay | Admitting: Nurse Practitioner

## 2018-02-02 VITALS — BP 128/84 | HR 100 | Resp 16 | Ht 59.0 in | Wt 136.0 lb

## 2018-02-02 DIAGNOSIS — D860 Sarcoidosis of lung: Secondary | ICD-10-CM | POA: Diagnosis not present

## 2018-02-02 DIAGNOSIS — J189 Pneumonia, unspecified organism: Secondary | ICD-10-CM | POA: Diagnosis not present

## 2018-02-02 DIAGNOSIS — R0602 Shortness of breath: Secondary | ICD-10-CM

## 2018-02-02 DIAGNOSIS — R0902 Hypoxemia: Secondary | ICD-10-CM | POA: Diagnosis not present

## 2018-02-02 DIAGNOSIS — I10 Essential (primary) hypertension: Secondary | ICD-10-CM | POA: Diagnosis not present

## 2018-02-02 MED ORDER — AZITHROMYCIN 250 MG PO TABS: ORAL_TABLET | ORAL | 0 refills | Status: DC

## 2018-02-02 MED ORDER — PREDNISONE 10 MG (21) PO TBPK: ORAL_TABLET | ORAL | 0 refills | Status: DC

## 2018-02-02 NOTE — Progress Notes (Signed)
[facility] [address] [place], [place] [address]  Internal MEDICINE  Office Visit Note  Patient Name: Marissa Luna  [id number]  [id number]  Date of Service: 02/02/2018  Chief Complaint  Patient presents with  . Pneumonia    shortness of breath    The patient was hospitalized from 01/11/2018 through 01/17/2018. She has completed antibiotic therapy and second round of prednisone. She is currently using oxygen via nasal cannula at all times at 2 liters per minute. She is still having shortness of breath and severe fatigue. States that she is supposed to go back to work on Tuesday, but is not ready. She has brough FMLA paperwork with her to be completed.    Pneumonia  She complains of cough, difficulty breathing, shortness of breath and wheezing. This is a recurrent problem. The current episode started 1 to 4 weeks ago. The problem occurs constantly. The problem has been gradually improving. The cough is non-productive. Associated symptoms include dyspnea on exertion, malaise/fatigue and orthopnea. Her symptoms are aggravated by any activity. Her symptoms are alleviated by rest, steroid inhaler and oral steroids (oxygen). She reports moderate improvement on treatment. Past medical history comments: Sarcoid of lung.    Pt is here for routine follow up.    Current Medication: Outpatient Encounter Medications as of 02/02/2018  Medication Sig  . ALBUTEROL SULFATE IN   . ALPRAZolam (XANAX) 0.25 MG tablet Take 1 tablet (0.25 mg total) by mouth 2 (two) times daily as needed for anxiety. (Patient not taking: Reported on 01/16/2018)  . amLODipine (NORVASC) 5 MG tablet Take 5 mg by mouth daily.  Marland Kitchen azithromycin (ZITHROMAX) 250 MG tablet z-pack - take as directed for 5 days  . Calcium Carbonate-Vitamin D 600-400 MG-UNIT tablet Take by mouth.  . cephALEXin (KEFLEX) 500 MG capsule   . cetirizine (ZYRTEC) 10 MG tablet Take 10 mg by mouth.  . fluticasone (FLONASE) 50 MCG/ACT nasal spray  Place into the nose.  . fluticasone furoate-vilanterol (BREO ELLIPTA) 100-25 MCG/INH AEPB   . folic acid (FOLVITE) 1 MG tablet Take 1 mg by mouth.  . levofloxacin (LEVAQUIN) 500 MG tablet Take 1 tablet (500 mg total) by mouth daily.  . predniSONE (DELTASONE) 5 MG tablet Take 1 tablet (5 mg total) by mouth daily with breakfast.  . predniSONE (STERAPRED UNI-PAK 21 TAB) 10 MG (21) TBPK tablet 6 day taper - take by mouth as directed for 6 days  . PROAIR HFA 108 (90 Base) MCG/ACT inhaler Inhale 2 puffs into the lungs every 4 (four) hours as needed for wheezing or shortness of breath.   . promethazine-codeine (PHENERGAN WITH CODEINE) 6.25-10 MG/5ML syrup Take 5 mLs by mouth every 8 (eight) hours as needed for cough.  . tiotropium (SPIRIVA) 18 MCG inhalation capsule Place 18 mcg into inhaler and inhale daily.  . TRELEGY ELLIPTA 100-62.5-25 MCG/INH AEPB   . Vitamin D, Ergocalciferol, (DRISDOL) 50000 units CAPS capsule Take 1 capsule (50,000 Units total) by mouth once a week. (Patient taking differently: Take 50,000 Units by mouth once a week. Take on sunday)  . [DISCONTINUED] predniSONE (STERAPRED UNI-PAK 21 TAB) 10 MG (21) TBPK tablet 6 tab day 1 and taper 1 tab a day- 6 days   No facility-administered encounter medications on file as of 02/02/2018.     Surgical History: Past Surgical History:  Procedure Laterality Date  . CHOLECYSTECTOMY    . COLONOSCOPY N/A 10/26/2015   Procedure: COLONOSCOPY;  Surgeon: Wallace Cullens, MD;  Location: [facility] ENDOSCOPY;  Service: Gastroenterology;  Laterality: N/A;    Medical History: Past Medical History:  Diagnosis Date  . Asthma   . Environmental allergies   . Hypertension   . Sarcoidosis     Family History: Family History  Problem Relation Age of Onset  . Lung cancer Mother   . Colon cancer Father   . Diabetes Sister     Social History   Socioeconomic History  . Marital status: Married    Spouse name: Not on file  . Number of children: Not on file   . Years of education: Not on file  . Highest education level: Not on file  Social Needs  . Financial resource strain: Not on file  . Food insecurity - worry: Not on file  . Food insecurity - inability: Not on file  . Transportation needs - medical: Not on file  . Transportation needs - non-medical: Not on file  Occupational History  . Not on file  Tobacco Use  . Smoking status: Never Smoker  . Smokeless tobacco: Never Used  Substance and Sexual Activity  . Alcohol use: No  . Drug use: No  . Sexual activity: Not on file  Other Topics Concern  . Not on file  Social History Narrative  . Not on file      Review of Systems  Constitutional: Positive for malaise/fatigue.  Respiratory: Positive for cough, shortness of breath and wheezing.   Cardiovascular: Positive for dyspnea on exertion.    Today's Vitals   02/02/18 0844 02/02/18 0846  BP: 128/84   Pulse: 100   Resp: 16   SpO2: 93% 93%  Weight: 136 lb (61.7 kg)   Height: 4\' 11"  (1.499 m)     Physical Exam  Constitutional: She is oriented to person, place, and time. She appears well-developed and well-nourished. She appears ill. No distress. Nasal cannula in place.  HENT:  Head: Normocephalic and atraumatic.  Right Ear: Tympanic membrane is erythematous and bulging.  Left Ear: Tympanic membrane is erythematous and bulging.  Nose: Rhinorrhea present. Right sinus exhibits maxillary sinus tenderness and frontal sinus tenderness. Left sinus exhibits maxillary sinus tenderness and frontal sinus tenderness.  Mouth/Throat: Posterior oropharyngeal erythema present. No oropharyngeal exudate.  Eyes: EOM are normal. Pupils are equal, round, and reactive to light.  Neck: Normal range of motion. Neck supple. No JVD present. No tracheal deviation present. No thyromegaly present.  Cardiovascular: Normal rate, regular rhythm and normal heart sounds. Exam reveals no gallop and no friction rub.  No murmur heard. Mild tachycardia   Pulmonary/Chest: Effort normal. No respiratory distress. She has decreased breath sounds. She has no wheezes. She has no rales. She exhibits no tenderness.  Dry, non-productive cough present.   Abdominal: Soft. Bowel sounds are normal. There is no tenderness.  Musculoskeletal: Normal range of motion.  Lymphadenopathy:    She has cervical adenopathy.  Neurological: She is alert and oriented to person, place, and time. No cranial nerve deficit.  Skin: Skin is warm and dry. She is not diaphoretic.  Psychiatric: She has a normal mood and affect. Her behavior is normal. Judgment and thought content normal.  Nursing note and vitals reviewed.  Assessment/Plan: 1. Community acquired pneumonia, unspecified laterality Patient continuing to feel fatigue and SOB - CBC w/Diff/Platelet - Comprehensive Metabolic Panel (CMET) - azithromycin (ZITHROMAX) 250 MG tablet; z-pack - take as directed for 5 days  Dispense: 6 tablet; Refill: 0 - predniSONE (STERAPRED UNI-PAK 21 TAB) 10 MG (21) TBPK tablet; 6 day taper -  take by mouth as directed for 6 days  Dispense: 21 tablet; Refill: 0  2. Shortness of breath Recheck chest x-ray due to presence of pneumonia. - DG Chest 2 View; Future - CBC w/Diff/Platelet - Comprehensive Metabolic Panel (CMET) - azithromycin (ZITHROMAX) 250 MG tablet; z-pack - take as directed for 5 days  Dispense: 6 tablet; Refill: 0 - predniSONE (STERAPRED UNI-PAK 21 TAB) 10 MG (21) TBPK tablet; 6 day taper - take by mouth as directed for 6 days  Dispense: 21 tablet; Refill: 0  3. Hypoxemia Continue to use nasal cannula oxygen at all times. Will try to get smaller portable oxygen for patient.   4. Sarcoidosis of lung (HCC) Regular visits with pulmonology as scheduled.   5. Essential (primary) hypertension Stable. Continue bp medication as prescribed .  General Counseling: Lakiya verbalizes understanding of the findings of todays visit and agrees with plan of treatment. I have  discussed any further diagnostic evaluation that may be needed or ordered today. We also reviewed her medications today. she has been encouraged to call the office with any questions or concerns that should arise related to todays visit.   FMLA papers brought to the office. Will be filled out and returned to patient's employer when complete.   This patient was seen by Vincent Gros, FNP- C in Collaboration with Dr Lyndon Code as a part of collaborative care agreement    Orders Placed This Encounter  Procedures  . DG Chest 2 View  . CBC w/Diff/Platelet  . Comprehensive Metabolic Panel (CMET)    Meds ordered this encounter  Medications  . azithromycin (ZITHROMAX) 250 MG tablet    Sig: z-pack - take as directed for 5 days    Dispense:  6 tablet    Refill:  0    Order Specific Question:   Supervising Provider    Answer:   Lyndon Code [[id number]]  . predniSONE (STERAPRED UNI-PAK 21 TAB) 10 MG (21) TBPK tablet    Sig: 6 day taper - take by mouth as directed for 6 days    Dispense:  21 tablet    Refill:  0    Order Specific Question:   Supervising Provider    Answer:   Lyndon Code [[id number]]    Time spent: 64 Minutes    Dr Lyndon Code Internal medicine

## 2018-02-12 ENCOUNTER — Ambulatory Visit
Admission: RE | Admit: 2018-02-12 | Discharge: 2018-02-12 | Disposition: A | Discharge status: Home / Self Care | Payer: Managed Care, Other (non HMO) | Source: Ambulatory Visit | Attending: Nurse Practitioner | Admitting: Nurse Practitioner

## 2018-02-12 DIAGNOSIS — R0602 Shortness of breath: Secondary | ICD-10-CM | POA: Insufficient documentation

## 2018-02-12 DIAGNOSIS — J189 Pneumonia, unspecified organism: Secondary | ICD-10-CM | POA: Diagnosis present

## 2018-02-13 LAB — COMPREHENSIVE METABOLIC PANEL
A/G RATIO: 1.2 (ref 1.2–2.2)
ALT: 11 IU/L (ref 0–32)
AST: 11 IU/L (ref 0–40)
Albumin: 3.7 g/dL (ref 3.6–4.8)
Alkaline Phosphatase: 74 IU/L (ref 39–117)
BUN/Creatinine Ratio: 21 (ref 12–28)
BUN: 23 mg/dL (ref 8–27)
Bilirubin Total: 0.3 mg/dL (ref 0.0–1.2)
CALCIUM: 9.1 mg/dL (ref 8.7–10.3)
CO2: 24 mmol/L (ref 20–29)
Chloride: 105 mmol/L (ref 96–106)
Creatinine, Ser: 1.12 mg/dL — ABNORMAL HIGH (ref 0.57–1.00)
GFR calc non Af Amer: 53 mL/min/{1.73_m2} — ABNORMAL LOW (ref >59)
GFR, EST AFRICAN AMERICAN: 61 mL/min/{1.73_m2} (ref >59)
GLOBULIN, TOTAL: 3 g/dL (ref 1.5–4.5)
Glucose: 191 mg/dL — ABNORMAL HIGH (ref 65–99)
POTASSIUM: 4.3 mmol/L (ref 3.5–5.2)
SODIUM: 146 mmol/L — AB (ref 134–144)
TOTAL PROTEIN: 6.7 g/dL (ref 6.0–8.5)

## 2018-02-13 LAB — CBC WITH DIFFERENTIAL/PLATELET
BASOS: 0 %
Basophils Absolute: 0 10*3/uL (ref 0.0–0.2)
EOS (ABSOLUTE): 0.5 10*3/uL — ABNORMAL HIGH (ref 0.0–0.4)
EOS: 4 %
HEMATOCRIT: 39.5 % (ref 34.0–46.6)
Hemoglobin: 12.6 g/dL (ref 11.1–15.9)
IMMATURE GRANULOCYTES: 1 %
Immature Grans (Abs): 0.1 10*3/uL (ref 0.0–0.1)
Lymphocytes Absolute: 1.1 10*3/uL (ref 0.7–3.1)
Lymphs: 10 %
MCH: 28.3 pg (ref 26.6–33.0)
MCHC: 31.9 g/dL (ref 31.5–35.7)
MCV: 89 fL (ref 79–97)
Monocytes Absolute: 0.6 10*3/uL (ref 0.1–0.9)
Monocytes: 5 %
NEUTROS ABS: 9 10*3/uL — AB (ref 1.4–7.0)
NEUTROS PCT: 80 %
Platelets: 259 10*3/uL (ref 150–379)
RBC: 4.46 x10E6/uL (ref 3.77–5.28)
RDW: 14.3 % (ref 12.3–15.4)
WBC: 11.2 10*3/uL — ABNORMAL HIGH (ref 3.4–10.8)

## 2018-02-22 ENCOUNTER — Encounter: Payer: Self-pay | Admitting: Nurse Practitioner

## 2018-02-22 ENCOUNTER — Ambulatory Visit: Payer: Managed Care, Other (non HMO) | Admitting: Nurse Practitioner

## 2018-02-22 VITALS — BP 123/59 | HR 97 | Resp 16 | Ht 59.0 in | Wt 133.0 lb

## 2018-02-22 DIAGNOSIS — J209 Acute bronchitis, unspecified: Secondary | ICD-10-CM | POA: Insufficient documentation

## 2018-02-22 DIAGNOSIS — J440 Chronic obstructive pulmonary disease with acute lower respiratory infection: Secondary | ICD-10-CM

## 2018-02-22 DIAGNOSIS — E1165 Type 2 diabetes mellitus with hyperglycemia: Secondary | ICD-10-CM

## 2018-02-22 DIAGNOSIS — I10 Essential (primary) hypertension: Secondary | ICD-10-CM

## 2018-02-22 DIAGNOSIS — N289 Disorder of kidney and ureter, unspecified: Secondary | ICD-10-CM | POA: Diagnosis not present

## 2018-02-22 DIAGNOSIS — D72820 Lymphocytosis (symptomatic): Secondary | ICD-10-CM | POA: Diagnosis not present

## 2018-02-22 LAB — POCT GLYCOSYLATED HEMOGLOBIN (HGB A1C): Hemoglobin A1C: 6.6

## 2018-02-22 MED ORDER — AMOXICILLIN 875 MG PO TABS: 875.0000 mg | ORAL_TABLET | Freq: Two times a day (BID) | ORAL | 0 refills | Status: DC

## 2018-02-22 NOTE — Progress Notes (Signed)
[facility] [address] [place], [place] [address]  Internal MEDICINE  Office Visit Note  Patient Name: Marissa MiyamotoKatie S Luna  [id number]  [id number]  Date of Service: 02/22/2018  Chief Complaint  Patient presents with  . Fatigue    The patient was hospitalized from 01/11/2018 through 01/17/2018. She has completed antibiotic therapy and second round of prednisone. She is currently using oxygen via nasal cannula at all times at 2 liters per minute. Breathing has improved. Only feeling short of breath with exertion. Still very fatigued. Gets very tired after just making her bed. She did have labs done since her hospitalization. Indicates elevated WBC and renal functions. Chest x-ray did show resolution of pneumonia.   Pneumonia  She complains of difficulty breathing, shortness of breath and wheezing. There is no cough. This is a recurrent problem. The current episode started more than 1 month ago. The problem occurs intermittently. The problem has been gradually improving. The cough is non-productive. Associated symptoms include dyspnea on exertion, headaches, malaise/fatigue, orthopnea and postnasal drip. Pertinent negatives include no chest pain. Her symptoms are aggravated by minimal activity and strenuous activity. Her symptoms are alleviated by rest, steroid inhaler and oral steroids (oxygen and antibiotics ). She reports moderate improvement on treatment. 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 Her past medical history is significant for asthma. Past medical history comments: Sarcoid of lung.    Pt is here for routine follow up.    Current Medication: Outpatient Encounter Medications as of 02/22/2018  Medication Sig  . ALBUTEROL SULFATE IN   . ALPRAZolam (XANAX) 0.25 MG tablet Take 1 tablet (0.25 mg total) by mouth 2 (two) times daily as needed for anxiety. (Patient not taking: Reported on 01/16/2018)  . amLODipine (NORVASC) 5 MG tablet Take 5 mg by mouth daily.  Marland Kitchen. amoxicillin (AMOXIL)  875 MG tablet Take 1 tablet (875 mg total) by mouth 2 (two) times daily.  Marland Kitchen. azithromycin (ZITHROMAX) 250 MG tablet z-pack - take as directed for 5 days  . Calcium Carbonate-Vitamin D 600-400 MG-UNIT tablet Take by mouth.  . cephALEXin (KEFLEX) 500 MG capsule   . cetirizine (ZYRTEC) 10 MG tablet Take 10 mg by mouth.  . fluticasone (FLONASE) 50 MCG/ACT nasal spray Place into the nose.  . fluticasone furoate-vilanterol (BREO ELLIPTA) 100-25 MCG/INH AEPB   . folic acid (FOLVITE) 1 MG tablet Take 1 mg by mouth.  . levofloxacin (LEVAQUIN) 500 MG tablet Take 1 tablet (500 mg total) by mouth daily.  . predniSONE (DELTASONE) 5 MG tablet Take 1 tablet (5 mg total) by mouth daily with breakfast.  . predniSONE (STERAPRED UNI-PAK 21 TAB) 10 MG (21) TBPK tablet 6 day taper - take by mouth as directed for 6 days  . PROAIR HFA 108 (90 Base) MCG/ACT inhaler Inhale 2 puffs into the lungs every 4 (four) hours as needed for wheezing or shortness of breath.   . promethazine-codeine (PHENERGAN WITH CODEINE) 6.25-10 MG/5ML syrup Take 5 mLs by mouth every 8 (eight) hours as needed for cough.  . tiotropium (SPIRIVA) 18 MCG inhalation capsule Place 18 mcg into inhaler and inhale daily.  . TRELEGY ELLIPTA 100-62.5-25 MCG/INH AEPB   . Vitamin D, Ergocalciferol, (DRISDOL) 50000 units CAPS capsule Take 1 capsule (50,000 Units total) by mouth once a week. (Patient taking differently: Take 50,000 Units by mouth once a week. Take on sunday)   No facility-administered encounter medications on file as of 02/22/2018.     Surgical History: Past Surgical History:  Procedure Laterality  Date  . CHOLECYSTECTOMY    . COLONOSCOPY N/A 10/26/2015   Procedure: COLONOSCOPY;  Surgeon: Wallace Cullens, MD;  Location[facility] ENDOSCOPY;  Service: Gastroenterology;  Laterality: N/A;    Medical History: Past Medical History:  Diagnosis Date  . Asthma   . Environmental allergies   . Hypertension   . Sarcoidosis     Family History: Family  History  Problem Relation Age of Onset  . Lung cancer Mother   . Colon cancer Father   . Diabetes Sister     Social History   Socioeconomic History  . Marital status: Married    Spouse name: Not on file  . Number of children: Not on file  . Years of education: Not on file  . Highest education level: Not on file  Occupational History  . Not on file  Social Needs  . Financial resource strain: Not on file  . Food insecurity:    Worry: Not on file    Inability: Not on file  . Transportation needs:    Medical: Not on file    Non-medical: Not on file  Tobacco Use  . Smoking status: Never Smoker  . Smokeless tobacco: Never Used  Substance and Sexual Activity  . Alcohol use: No  . Drug use: No  . Sexual activity: Not on file  Lifestyle  . Physical activity:    Days per week: Not on file    Minutes per session: Not on file  . Stress: Not on file  Relationships  . Social connections:    Talks on phone: Not on file    Gets together: Not on file    Attends religious service: Not on file    Active member of club or organization: Not on file    Attends meetings of clubs or organizations: Not on file    Relationship status: Not on file  . Intimate partner violence:    Fear of current or ex partner: Not on file    Emotionally abused: Not on file    Physically abused: Not on file    Forced sexual activity: Not on file  Other Topics Concern  . Not on file  Social History Narrative  . Not on file      Review of Systems  Constitutional: Positive for activity change, fatigue and malaise/fatigue.  HENT: Positive for postnasal drip. Negative for voice change.   Eyes: Negative.   Respiratory: Positive for shortness of breath and wheezing. Negative for cough.   Cardiovascular: Positive for dyspnea on exertion. Negative for chest pain and palpitations.  Gastrointestinal: Negative for constipation, diarrhea, nausea and vomiting.  Endocrine: Negative for cold intolerance, heat  intolerance, polydipsia, polyphagia and polyuria.  Genitourinary: Negative.   Musculoskeletal: Negative for arthralgias and back pain.  Skin: Negative for rash.  Allergic/Immunologic: Positive for environmental allergies.  Neurological: Positive for weakness and headaches.  Psychiatric/Behavioral: Negative for agitation and behavioral problems. The patient is not nervous/anxious.     Vital Signs: BP (!) 123/59   Pulse 97   Resp 16   Ht 4\' 11"  (1.499 m)   Wt 133 lb (60.3 kg)   SpO2 94%   BMI 26.86 kg/m    Physical Exam  Constitutional: She is oriented to person, place, and time. She appears well-developed and well-nourished. She appears ill. No distress. Nasal cannula in place.  HENT:  Head: Normocephalic and atraumatic.  Right Ear: Tympanic membrane is erythematous and bulging.  Left Ear: Tympanic membrane is erythematous and bulging.  Nose: Rhinorrhea present. Right sinus exhibits maxillary sinus tenderness and frontal sinus tenderness. Left sinus exhibits maxillary sinus tenderness and frontal sinus tenderness.  Mouth/Throat: Posterior oropharyngeal erythema present. No oropharyngeal exudate.  Eyes: Pupils are equal, round, and reactive to light. EOM are normal.  Neck: Normal range of motion. Neck supple. No JVD present. No tracheal deviation present. No thyromegaly present.  Cardiovascular: Normal rate, regular rhythm and normal heart sounds. Exam reveals no gallop and no friction rub.  No murmur heard. Mild tachycardia  Pulmonary/Chest: Effort normal. No respiratory distress. She has decreased breath sounds. She has no wheezes. She has no rales. She exhibits no tenderness.  Some expiratory congestion in left lower lebe of the lung. Breath sounds are otherwise clear and equal throught lung fields.   Abdominal: Soft. Bowel sounds are normal. There is no tenderness.  Musculoskeletal: Normal range of motion.  Lymphadenopathy:    She has cervical adenopathy.  Neurological: She  is alert and oriented to person, place, and time. No cranial nerve deficit.  Skin: Skin is warm and dry. She is not diaphoretic.  Psychiatric: She has a normal mood and affect. Her behavior is normal. Judgment and thought content normal.  Nursing note and vitals reviewed.  Assessment/Plan: 1. Acute bronchitis with COPD (HCC) Chest x-ray indicates resolution of pneumonia, however, still congestion in left lower lobe of the lung.  - amoxicillin (AMOXIL) 875 MG tablet; Take 1 tablet (875 mg total) by mouth 2 (two) times daily.  Dispense: 20 tablet; Refill: 0  2. Lymphocytosis Recheck CBC after treatment with amoxicillin and discuss at next visit.   3. Abnormal kidney function Encourage increased fluid intake.recheck BMP and discuss results at next visit.   4. Essential (primary) hypertension Stable. Continue to take bp medication as prescribed.   Patient to be given additional 2 weeks out of work due to fatigue and persistent infection. Note given for her employer, notifying them of this change. Will rassess in 2 weeks for readiness to return to work.   General Counseling: Shelli verbalizes understanding of the findings of todays visit and agrees with plan of treatment. I have discussed any further diagnostic evaluation that may be needed or ordered today. We also reviewed her medications today. she has been encouraged to call the office with any questions or concerns that should arise related to todays visit.  This patient was seen by Vincent Gros, FNP- C in Collaboration with Dr Lyndon Code as a part of collaborative care agreement      Orders Placed This Encounter  Procedures  . CBC  . Basic Metabolic Panel (BMET)    Meds ordered this encounter  Medications  . amoxicillin (AMOXIL) 875 MG tablet    Sig: Take 1 tablet (875 mg total) by mouth 2 (two) times daily.    Dispense:  20 tablet    Refill:  0    Order Specific Question:   Supervising Provider    Answer:   Lyndon Code [[id number]]    Time spent: 47 Minutes     Dr Lyndon Code Internal medicine

## 2018-02-22 NOTE — Addendum Note (Signed)
Addended by: Golda AcrePATEL, Angelice Piech on: 02/22/2018 09:30 AM   Modules accepted: Orders

## 2018-02-23 ENCOUNTER — Telehealth: Payer: Self-pay

## 2018-02-23 NOTE — Telephone Encounter (Signed)
Spoke to [employer]steve with [facility].  Pt is wanting a lighter weight portable oxygen tank.  Brett CanalesSteve advised that pt has a 4lb tank and not sure if it goes lighter if she would benefit from battery life.  Brett CanalesSteve advised that he would call and speak to pt and take care of what she needs.  He will contact us back if he needs any extra orders. Juleen Chinadvised Heather of the conversation with [facility].  dbs

## 2018-02-28 LAB — CBC
HEMATOCRIT: 39.1 % (ref 34.0–46.6)
HEMOGLOBIN: 12.3 g/dL (ref 11.1–15.9)
MCH: 28.1 pg (ref 26.6–33.0)
MCHC: 31.5 g/dL (ref 31.5–35.7)
MCV: 90 fL (ref 79–97)
Platelets: 296 10*3/uL (ref 150–379)
RBC: 4.37 x10E6/uL (ref 3.77–5.28)
RDW: 14.1 % (ref 12.3–15.4)
WBC: 7.9 10*3/uL (ref 3.4–10.8)

## 2018-02-28 LAB — BASIC METABOLIC PANEL
BUN/Creatinine Ratio: 22 (ref 12–28)
BUN: 23 mg/dL (ref 8–27)
CALCIUM: 9.3 mg/dL (ref 8.7–10.3)
CO2: 25 mmol/L (ref 20–29)
CREATININE: 1.04 mg/dL — AB (ref 0.57–1.00)
Chloride: 103 mmol/L (ref 96–106)
GFR calc Af Amer: 67 mL/min/{1.73_m2} (ref >59)
GFR, EST NON AFRICAN AMERICAN: 58 mL/min/{1.73_m2} — AB (ref >59)
Glucose: 113 mg/dL — ABNORMAL HIGH (ref 65–99)
POTASSIUM: 4.6 mmol/L (ref 3.5–5.2)
Sodium: 142 mmol/L (ref 134–144)

## 2018-03-01 ENCOUNTER — Encounter: Payer: Self-pay | Admitting: Nurse Practitioner

## 2018-03-01 ENCOUNTER — Ambulatory Visit: Payer: Managed Care, Other (non HMO) | Admitting: Nurse Practitioner

## 2018-03-01 VITALS — BP 110/75 | HR 85 | Resp 16 | Ht 59.0 in | Wt 133.2 lb

## 2018-03-01 DIAGNOSIS — R300 Dysuria: Secondary | ICD-10-CM | POA: Diagnosis not present

## 2018-03-01 DIAGNOSIS — D860 Sarcoidosis of lung: Secondary | ICD-10-CM | POA: Diagnosis not present

## 2018-03-01 DIAGNOSIS — Z1239 Encounter for other screening for malignant neoplasm of breast: Secondary | ICD-10-CM

## 2018-03-01 DIAGNOSIS — Z1231 Encounter for screening mammogram for malignant neoplasm of breast: Secondary | ICD-10-CM | POA: Diagnosis not present

## 2018-03-01 DIAGNOSIS — I10 Essential (primary) hypertension: Secondary | ICD-10-CM | POA: Diagnosis not present

## 2018-03-01 DIAGNOSIS — R0602 Shortness of breath: Secondary | ICD-10-CM | POA: Diagnosis not present

## 2018-03-01 DIAGNOSIS — Z0001 Encounter for general adult medical examination with abnormal findings: Secondary | ICD-10-CM

## 2018-03-01 NOTE — Progress Notes (Signed)
[facility], [place] [address]  Internal MEDICINE  Office Visit Note  Patient Name: Marissa Luna  [id number]  [id number]  Date of Service: 03/25/2018  Chief Complaint  Patient presents with  . Annual Exam     The patient is here for health maintenance exam. Recently hospitalized for pneumonia. Has been out of work due to persistent infection and hypoxia. She is still on last round of antibiotics. Still using oxygen at 2 liters per minute. Today, breathing is easier. Able to be more active. Does get winded when exerting herself.   Pt is here for routine health maintenance examination  Current Medication: Outpatient Encounter Medications as of 03/01/2018  Medication Sig  . amLODipine (NORVASC) 5 MG tablet Take 5 mg by mouth daily.  . predniSONE (DELTASONE) 5 MG tablet Take 1 tablet (5 mg total) by mouth daily with breakfast.  . TRELEGY ELLIPTA 100-62.5-25 MCG/INH AEPB   . ALBUTEROL SULFATE IN   . ALPRAZolam (XANAX) 0.25 MG tablet Take 1 tablet (0.25 mg total) by mouth 2 (two) times daily as needed for anxiety.  Marland Kitchen amoxicillin (AMOXIL) 875 MG tablet Take 1 tablet (875 mg total) by mouth 2 (two) times daily.  Marland Kitchen azithromycin (ZITHROMAX) 250 MG tablet z-pack - take as directed for 5 days  . Calcium Carbonate-Vitamin D 600-400 MG-UNIT tablet Take by mouth.  . cephALEXin (KEFLEX) 500 MG capsule   . cetirizine (ZYRTEC) 10 MG tablet Take 10 mg by mouth.  . fluticasone (FLONASE) 50 MCG/ACT nasal spray Place into the nose.  . fluticasone furoate-vilanterol (BREO ELLIPTA) 100-25 MCG/INH AEPB   . folic acid (FOLVITE) 1 MG tablet Take 1 mg by mouth.  . levofloxacin (LEVAQUIN) 500 MG tablet Take 1 tablet (500 mg total) by mouth daily.  . predniSONE (STERAPRED UNI-PAK 21 TAB) 10 MG (21) TBPK tablet 6 day taper - take by mouth as directed for 6 days  . PROAIR HFA 108 (90 Base) MCG/ACT inhaler Inhale 2 puffs into the lungs every 4 (four) hours as needed for  wheezing or shortness of breath.   . promethazine-codeine (PHENERGAN WITH CODEINE) 6.25-10 MG/5ML syrup Take 5 mLs by mouth every 8 (eight) hours as needed for cough.  . tiotropium (SPIRIVA) 18 MCG inhalation capsule Place 18 mcg into inhaler and inhale daily.  . Vitamin D, Ergocalciferol, (DRISDOL) 50000 units CAPS capsule Take 1 capsule (50,000 Units total) by mouth once a week.   No facility-administered encounter medications on file as of 03/01/2018.     Surgical History: Past Surgical History:  Procedure Laterality Date  . CHOLECYSTECTOMY    . COLONOSCOPY N/A 10/26/2015   Procedure: COLONOSCOPY;  Surgeon: Hulen Luster, MD;  Location: [facility] ENDOSCOPY;  Service: Gastroenterology;  Laterality: N/A;    Medical History: Past Medical History:  Diagnosis Date  . Asthma   . Environmental allergies   . Hypertension   . Sarcoidosis     Family History: Family History  Problem Relation Age of Onset  . Lung cancer Mother   . Colon cancer Father   . Diabetes Sister       Review of Systems  Constitutional: Positive for activity change and fatigue.  HENT: Negative for postnasal drip and voice change.   Eyes: Negative.   Respiratory: Positive for shortness of breath and wheezing. Negative for cough.   Cardiovascular: Negative for chest pain and palpitations.  Gastrointestinal: Negative for constipation, diarrhea, nausea and vomiting.  Endocrine: Negative for cold intolerance, heat intolerance, polydipsia,  polyphagia and polyuria.  Genitourinary: Negative.  Negative for dysuria, flank pain, frequency and urgency.  Musculoskeletal: Negative for arthralgias, back pain and myalgias.  Skin: Negative for rash.  Allergic/Immunologic: Positive for environmental allergies.  Neurological: Positive for weakness and headaches.  Psychiatric/Behavioral: Negative for agitation and behavioral problems. The patient is nervous/anxious.      Today's Vitals   03/01/18 1501 03/01/18 1513  BP: 110/75    Pulse: 85 85  Resp: 16   SpO2: 99% 96%  Weight: 133 lb 3.2 oz (60.4 kg)   Height: 4' 11"  (1.499 m)     Physical Exam  Constitutional: She is oriented to person, place, and time. She appears well-developed and well-nourished. She appears ill. No distress. Nasal cannula in place.  HENT:  Head: Normocephalic and atraumatic.  Right Ear: Tympanic membrane is erythematous and bulging.  Left Ear: Tympanic membrane is erythematous and bulging.  Nose: Rhinorrhea present. Right sinus exhibits maxillary sinus tenderness and frontal sinus tenderness. Left sinus exhibits maxillary sinus tenderness and frontal sinus tenderness.  Mouth/Throat: Posterior oropharyngeal erythema present. No oropharyngeal exudate.  Eyes: Pupils are equal, round, and reactive to light. EOM are normal.  Neck: Normal range of motion. Neck supple. No JVD present. No tracheal deviation present. No thyromegaly present.  Cardiovascular: Normal rate, regular rhythm, normal heart sounds and intact distal pulses. Exam reveals no gallop and no friction rub.  No murmur heard. Mild tachycardia  Pulmonary/Chest: Effort normal and breath sounds normal. No respiratory distress. She has no wheezes. She has no rales. She exhibits no tenderness. Right breast exhibits no inverted nipple, no mass, no nipple discharge, no skin change and no tenderness. Left breast exhibits no inverted nipple, no mass, no nipple discharge, no skin change and no tenderness.  Some expiratory congestion in left lower lebe of the lung. Breath sounds are otherwise clear and equal throught lung fields.   Abdominal: Soft. Bowel sounds are normal. There is no tenderness.  Musculoskeletal: Normal range of motion.  Lymphadenopathy:    She has no cervical adenopathy.  Neurological: She is alert and oriented to person, place, and time. No cranial nerve deficit.  Skin: Skin is warm and dry. She is not diaphoretic.  Psychiatric: She has a normal mood and affect. Her behavior  is normal. Judgment and thought content normal.  Nursing note and vitals reviewed.    LABS: Recent Results (from the past 2160 hour(s))  POCT Influenza A/B     Status: None   Collection Time: 01/11/18 12:31 PM  Result Value Ref Range   Influenza A, POC Negative Negative   Influenza B, POC Negative Negative  Blood Culture (routine x 2)     Status: None   Collection Time: 01/11/18  8:59 PM  Result Value Ref Range   Specimen Description BLOOD RIGHT AC    Special Requests      BOTTLES DRAWN AEROBIC AND ANAEROBIC Blood Culture adequate volume   Culture      NO GROWTH 5 DAYS Performed at [facility], [address] [place], [place] [address]    Report Status 01/16/2018 FINAL   Comprehensive metabolic panel     Status: Abnormal   Collection Time: 01/11/18  9:00 PM  Result Value Ref Range   Sodium 138 135 - 145 mmol/L   Potassium 3.9 3.5 - 5.1 mmol/L   Chloride 102 101 - 111 mmol/L   CO2 29 22 - 32 mmol/L   Glucose, Bld 126 (H) 65 - 99 mg/dL  BUN 17 6 - 20 mg/dL   Creatinine, Ser 1.16 (H) 0.44 - 1.00 mg/dL   Calcium 9.0 8.9 - 10.3 mg/dL   Total Protein 8.3 (H) 6.5 - 8.1 g/dL   Albumin 3.7 3.5 - 5.0 g/dL   AST 22 15 - 41 U/L   ALT 15 14 - 54 U/L   Alkaline Phosphatase 68 38 - 126 U/L   Total Bilirubin 0.4 0.3 - 1.2 mg/dL   GFR calc non Af Amer 49 (L) >60 mL/min   GFR calc Af Amer 57 (L) >60 mL/min    Comment: (NOTE) The eGFR has been calculated using the CKD EPI equation. This calculation has not been validated in all clinical situations. eGFR's persistently <60 mL/min signify possible Chronic Kidney Disease.    Anion gap 7 5 - 15    Comment: Performed at [facility], [address] [place], [place] [address]  CBC WITH DIFFERENTIAL     Status: Abnormal   Collection Time: 01/11/18  9:00 PM  Result Value Ref Range   WBC 9.5 3.6 - 11.0 K/uL   RBC 4.54 3.80 - 5.20 MIL/uL   Hemoglobin 12.7 12.0 - 16.0 g/dL   HCT 39.6 35.0 - 47.0 %   MCV 87.2 80.0 -  100.0 fL   MCH 28.0 26.0 - 34.0 pg   MCHC 32.1 32.0 - 36.0 g/dL   RDW 13.7 11.5 - 14.5 %   Platelets 271 150 - 440 K/uL   Neutrophils Relative % 91 %   Neutro Abs 8.6 (H) 1.4 - 6.5 K/uL   Lymphocytes Relative 5 %   Lymphs Abs 0.5 (L) 1.0 - 3.6 K/uL   Monocytes Relative 4 %   Monocytes Absolute 0.3 0.2 - 0.9 K/uL   Eosinophils Relative 0 %   Eosinophils Absolute 0.0 0 - 0.7 K/uL   Basophils Relative 0 %   Basophils Absolute 0.0 0 - 0.1 K/uL    Comment: Performed at [facility], [address] [place], [place] [address]  Blood Culture (routine x 2)     Status: None   Collection Time: 01/11/18  9:00 PM  Result Value Ref Range   Specimen Description BLOOD BLOOD RIGHT FOREARM    Special Requests      BOTTLES DRAWN AEROBIC AND ANAEROBIC Blood Culture adequate volume   Culture      NO GROWTH 5 DAYS Performed at [facility], [address] [place],  [address]    Report Status 01/16/2018 FINAL   Urinalysis, Routine w reflex microscopic     Status: Abnormal   Collection Time: 01/11/18  9:00 PM  Result Value Ref Range   Color, Urine YELLOW (A) YELLOW   APPearance HAZY (A) CLEAR   Specific Gravity, Urine 1.010 1.005 - 1.030   pH 6.0 5.0 - 8.0   Glucose, UA NEGATIVE NEGATIVE mg/dL   Hgb urine dipstick SMALL (A) NEGATIVE   Bilirubin Urine NEGATIVE NEGATIVE   Ketones, ur NEGATIVE NEGATIVE mg/dL   Protein, ur NEGATIVE NEGATIVE mg/dL   Nitrite NEGATIVE NEGATIVE   Leukocytes, UA NEGATIVE NEGATIVE   RBC / HPF 0-5 0 - 5 RBC/hpf   WBC, UA 0-5 0 - 5 WBC/hpf   Bacteria, UA RARE (A) NONE SEEN   Squamous Epithelial / LPF 0-5 (A) NONE SEEN   Mucus PRESENT     Comment: Performed at [facility], [address] [place], [place] [address]  Lactic acid, plasma     Status: None  Collection Time: 01/11/18  9:00 PM  Result Value Ref Range   Lactic Acid, Venous 1.1 0.5 - 1.9 mmol/L    Comment: Performed at [facility], [address]  [place], [place] [address]  Urine culture     Status: Abnormal   Collection Time: 01/11/18  9:00 PM  Result Value Ref Range   Specimen Description      URINE, RANDOM Performed at [facility], [address] [place], [place] [address]    Special Requests      NONE Performed at [facility], [address] [place], [place] [address]    Culture MULTIPLE SPECIES PRESENT, SUGGEST RECOLLECTION (A)    Report Status 01/13/2018 FINAL   Influenza panel by PCR (type A & B)     Status: Abnormal   Collection Time: 01/11/18  9:00 PM  Result Value Ref Range   Influenza A By PCR POSITIVE (A) NEGATIVE   Influenza B By PCR NEGATIVE NEGATIVE    Comment: (NOTE) The Xpert Xpress Flu assay is intended as an aid in the diagnosis of  influenza and should not be used as a sole basis for treatment.  This  assay is FDA approved for nasopharyngeal swab specimens only. Nasal  washings and aspirates are unacceptable for Xpert Xpress Flu testing. Performed at [facility], [address] [place], [place] [address]   HIV antibody (Routine Testing)     Status: None   Collection Time: 01/12/18  3:54 AM  Result Value Ref Range   HIV Screen 4th Generation wRfx Non Reactive Non Reactive    Comment: (NOTE) Performed At: [facility] [place], [place] [id number] Rush Farmer MD [id number] Performed at [facility], [address] [place], [place] [address]   Basic metabolic panel     Status: Abnormal   Collection Time: 01/12/18  3:54 AM  Result Value Ref Range   Sodium 140 135 - 145 mmol/L   Potassium 4.3 3.5 - 5.1 mmol/L   Chloride 108 101 - 111 mmol/L   CO2 24 22 - 32 mmol/L   Glucose, Bld 147 (H) 65 - 99 mg/dL   BUN 14 6 - 20 mg/dL   Creatinine, Ser 1.04 (H) 0.44 - 1.00 mg/dL   Calcium 7.9 (L) 8.9 - 10.3 mg/dL   GFR calc non Af Amer 56 (L) >60 mL/min   GFR calc Af Amer >60 >60 mL/min    Comment: (NOTE) The eGFR has been calculated  using the CKD EPI equation. This calculation has not been validated in all clinical situations. eGFR's persistently <60 mL/min signify possible Chronic Kidney Disease.    Anion gap 8 5 - 15    Comment: Performed at [facility], [address] [place], [place] [address]  CBC     Status: Abnormal   Collection Time: 01/12/18  3:54 AM  Result Value Ref Range   WBC 9.1 3.6 - 11.0 K/uL   RBC 3.89 3.80 - 5.20 MIL/uL   Hemoglobin 11.5 (L) 12.0 - 16.0 g/dL   HCT 36.0 35.0 - 47.0 %   MCV 92.7 80.0 - 100.0 fL   MCH 29.6 26.0 - 34.0 pg   MCHC 31.9 (L) 32.0 - 36.0 g/dL   RDW 14.8 (H) 11.5 - 14.5 %   Platelets 239 150 - 440 K/uL    Comment: Performed at [facility], [address], [place], [place] [address]  Procalcitonin - Baseline     Status: None  Collection Time: 01/12/18  3:54 AM  Result Value Ref Range   Procalcitonin 3.03 ng/mL    Comment:        Interpretation: PCT > 2 ng/mL: Systemic infection (sepsis) is likely, unless other causes are known. (NOTE)       Sepsis PCT Algorithm           Lower Respiratory Tract                                      Infection PCT Algorithm    ----------------------------     ----------------------------         PCT < 0.25 ng/mL                PCT < 0.10 ng/mL         Strongly encourage             Strongly discourage   discontinuation of antibiotics    initiation of antibiotics    ----------------------------     -----------------------------       PCT 0.25 - 0.50 ng/mL            PCT 0.10 - 0.25 ng/mL               OR       >80% decrease in PCT            Discourage initiation of                                            antibiotics      Encourage discontinuation           of antibiotics    ----------------------------     -----------------------------         PCT >= 0.50 ng/mL              PCT 0.26 - 0.50 ng/mL               AND       <80% decrease in PCT              Encourage initiation of                                              antibiotics       Encourage continuation           of antibiotics    ----------------------------     -----------------------------        PCT >= 0.50 ng/mL                  PCT > 0.50 ng/mL               AND         increase in PCT                  Strongly encourage                                      initiation of antibiotics    Strongly encourage escalation  of antibiotics                                     -----------------------------                                           PCT <= 0.25 ng/mL                                                 OR                                        > 80% decrease in PCT                                     Discontinue / Do not initiate                                             antibiotics Performed at [facility], [address] [place], [place] [address]   MRSA PCR Screening     Status: Abnormal   Collection Time: 01/12/18  9:45 AM  Result Value Ref Range   MRSA by PCR POSITIVE (A) NEGATIVE    Comment:        The GeneXpert MRSA Assay (FDA approved for NASAL specimens only), is one component of a comprehensive MRSA colonization surveillance program. It is not intended to diagnose MRSA infection nor to guide or monitor treatment for MRSA infections. RESULT CALLED TO, READ BACK BY AND VERIFIED WITH: [facility] PEREZ AT 1142 01/12/18 SDR Performed at [facility], [address] [place], [place] [address]   Procalcitonin     Status: None   Collection Time: 01/13/18  5:04 AM  Result Value Ref Range   Procalcitonin 3.65 ng/mL    Comment:        Interpretation: PCT > 2 ng/mL: Systemic infection (sepsis) is likely, unless other causes are known. (NOTE)       Sepsis PCT Algorithm           Lower Respiratory Tract                                      Infection PCT Algorithm    ----------------------------     ----------------------------         PCT < 0.25 ng/mL                PCT < 0.10  ng/mL         Strongly encourage             Strongly discourage   discontinuation of antibiotics    initiation of antibiotics    ----------------------------     -----------------------------       PCT 0.25 - 0.50 ng/mL  PCT 0.10 - 0.25 ng/mL               OR       >80% decrease in PCT            Discourage initiation of                                            antibiotics      Encourage discontinuation           of antibiotics    ----------------------------     -----------------------------         PCT >= 0.50 ng/mL              PCT 0.26 - 0.50 ng/mL               AND       <80% decrease in PCT              Encourage initiation of                                             antibiotics       Encourage continuation           of antibiotics    ----------------------------     -----------------------------        PCT >= 0.50 ng/mL                  PCT > 0.50 ng/mL               AND         increase in PCT                  Strongly encourage                                      initiation of antibiotics    Strongly encourage escalation           of antibiotics                                     -----------------------------                                           PCT <= 0.25 ng/mL                                                 OR                                        > 80% decrease in PCT  Discontinue / Do not initiate                                             antibiotics Performed at [facility], [address] [place], [place] [address]   Creatinine, serum     Status: Abnormal   Collection Time: 01/14/18  4:28 AM  Result Value Ref Range   Creatinine, Ser 1.05 (H) 0.44 - 1.00 mg/dL   GFR calc non Af Amer 56 (L) >60 mL/min   GFR calc Af Amer >60 >60 mL/min    Comment: (NOTE) The eGFR has been calculated using the CKD EPI equation. This calculation has not been validated in all clinical situations. eGFR's  persistently <60 mL/min signify possible Chronic Kidney Disease. Performed at [facility], [address] [place], [place] [address]   Angiotensin converting enzyme     Status: None   Collection Time: 01/14/18  4:29 AM  Result Value Ref Range   Angiotensin-Converting Enzyme 30 14 - 82 U/L    Comment: (NOTE) Performed At: [facility] [place], [place] [id number] Rush Farmer MD [id number] Performed at [facility], [address] [place], [place] [address]   CBC w/Diff/Platelet     Status: Abnormal   Collection Time: 02/12/18  2:43 PM  Result Value Ref Range   WBC 11.2 (H) 3.4 - 10.8 x10E3/uL   RBC 4.46 3.77 - 5.28 x10E6/uL   Hemoglobin 12.6 11.1 - 15.9 g/dL   Hematocrit 39.5 34.0 - 46.6 %   MCV 89 79 - 97 fL   MCH 28.3 26.6 - 33.0 pg   MCHC 31.9 31.5 - 35.7 g/dL   RDW 14.3 12.3 - 15.4 %   Platelets 259 150 - 379 x10E3/uL   Neutrophils 80 Not Estab. %   Lymphs 10 Not Estab. %   Monocytes 5 Not Estab. %   Eos 4 Not Estab. %   Basos 0 Not Estab. %   Neutrophils Absolute 9.0 (H) 1.4 - 7.0 x10E3/uL   Lymphocytes Absolute 1.1 0.7 - 3.1 x10E3/uL   Monocytes Absolute 0.6 0.1 - 0.9 x10E3/uL   EOS (ABSOLUTE) 0.5 (H) 0.0 - 0.4 x10E3/uL   Basophils Absolute 0.0 0.0 - 0.2 x10E3/uL   Immature Granulocytes 1 Not Estab. %   Immature Grans (Abs) 0.1 0.0 - 0.1 x10E3/uL  Comprehensive Metabolic Panel (CMET)     Status: Abnormal   Collection Time: 02/12/18  2:43 PM  Result Value Ref Range   Glucose 191 (H) 65 - 99 mg/dL   BUN 23 8 - 27 mg/dL   Creatinine, Ser 1.12 (H) 0.57 - 1.00 mg/dL   GFR calc non Af Amer 53 (L) >59 mL/min/1.73   GFR calc Af Amer 61 >59 mL/min/1.73   BUN/Creatinine Ratio 21 12 - 28   Sodium 146 (H) 134 - 144 mmol/L   Potassium 4.3 3.5 - 5.2 mmol/L   Chloride 105 96 - 106 mmol/L   CO2 24 20 - 29 mmol/L   Calcium 9.1 8.7 - 10.3 mg/dL   Total Protein 6.7 6.0 - 8.5 g/dL   Albumin 3.7 3.6 - 4.8 g/dL   Globulin,  Total 3.0 1.5 - 4.5 g/dL   Albumin/Globulin Ratio 1.2 1.2 - 2.2   Bilirubin Total 0.3 0.0 - 1.2 mg/dL   Alkaline Phosphatase 74 39 - 117 IU/L   AST 11  0 - 40 IU/L   ALT 11 0 - 32 IU/L  POCT glycosylated hemoglobin (Hb A1C)     Status: None   Collection Time: 02/22/18  9:29 AM  Result Value Ref Range   Hemoglobin A1C 6.6   CBC     Status: None   Collection Time: 02/27/18  2:00 PM  Result Value Ref Range   WBC 7.9 3.4 - 10.8 x10E3/uL   RBC 4.37 3.77 - 5.28 x10E6/uL   Hemoglobin 12.3 11.1 - 15.9 g/dL   Hematocrit 39.1 34.0 - 46.6 %   MCV 90 79 - 97 fL   MCH 28.1 26.6 - 33.0 pg   MCHC 31.5 31.5 - 35.7 g/dL   RDW 14.1 12.3 - 15.4 %   Platelets 296 150 - 379 [id number]  Basic Metabolic Panel (BMET)     Status: Abnormal   Collection Time: 02/27/18  2:00 PM  Result Value Ref Range   Glucose 113 (H) 65 - 99 mg/dL   BUN 23 8 - 27 mg/dL   Creatinine, Ser 1.04 (H) 0.57 - 1.00 mg/dL   GFR calc non Af Amer 58 (L) >59 mL/min/1.73   GFR calc Af Amer 67 >59 mL/min/1.73   BUN/Creatinine Ratio 22 12 - 28   Sodium 142 134 - 144 mmol/L   Potassium 4.6 3.5 - 5.2 mmol/L   Chloride 103 96 - 106 mmol/L   CO2 25 20 - 29 mmol/L   Calcium 9.3 8.7 - 10.3 mg/dL  Urinalysis, Routine w reflex microscopic     Status: Abnormal   Collection Time: 03/01/18  3:48 PM  Result Value Ref Range   Specific Gravity, UA 1.022 1.005 - 1.030   pH, UA 5.0 5.0 - 7.5   Color, UA Yellow Yellow   Appearance Ur Clear Clear   Leukocytes, UA Trace (A) Negative   Protein, UA Negative Negative/Trace   Glucose, UA Negative Negative   Ketones, UA Negative Negative   RBC, UA Negative Negative   Bilirubin, UA Negative Negative   Urobilinogen, Ur 1.0 0.2 - 1.0 mg/dL   Nitrite, UA Negative Negative   Microscopic Examination See below:     Comment: Microscopic was indicated and was performed.  Microscopic Examination     Status: Abnormal   Collection Time: 03/01/18  3:48 PM  Result Value Ref Range   WBC, UA 0-5 0 - 5 /hpf    RBC, UA 0-2 0 - 2 /hpf   Epithelial Cells (non renal) >10 (A) 0 - 10 /hpf   Casts None seen None seen /lpf   Crystals Present (A) N/A   Crystal Type Calcium Oxalate N/A   Mucus, UA Present Not Estab.   Bacteria, UA Few None seen/Few   Yeast, UA Present (A) None seen    Assessment/Plan: 1. Encounter for general adult medical examination with abnormal findings Annual health maintenance exam today.  2. Shortness of breath Improving, though still requiring oxygen per nasal cannula at times. Will continue to use nasal cannula oxygen at 2 liters per minute as needed and follow up with Dr. Devona Konig for further evaluation of need for conitnued oxygen treatment.   3. Pulmonary sarcoidosis ([place]) Continue to use inhalers and respiratory medications as prescribed.   4. Essential (primary) hypertension bp stable. Continue bp medication as prescribed.   5. Screening for breast cancer - MM DIGITAL SCREENING BILATERAL; Future  6. Dysuria - Urinalysis, Routine w reflex microscopic  General Counseling: Mikeila verbalizes understanding of the findings of todays visit  and agrees with plan of treatment. I have discussed any further diagnostic evaluation that may be needed or ordered today. We also reviewed her medications today. she has been encouraged to call the office with any questions or concerns that should arise related to todays visit.   Hypertension Counseling:   The following hypertensive lifestyle modification were recommended and discussed:  1. Limiting alcohol intake to less than 1 oz/day of ethanol:(24 oz of beer or 8 oz of wine or 2 oz of 100-proof whiskey). 2. Take baby ASA 81 mg daily. 3. Importance of regular aerobic exercise and losing weight. 4. Reduce dietary saturated fat and cholesterol intake for overall cardiovascular health. 5. Maintaining adequate dietary potassium, calcium, and magnesium intake. 6. Regular monitoring of the blood pressure. 7. Reduce sodium intake to  less than 100 mmol/day (less than 2.3 gm of sodium or less than 6 gm of sodium choride)   This patient was seen by Leretha Pol, FNP- C in Collaboration with Dr Lavera Guise as a part of collaborative care agreement    Orders Placed This Encounter  Procedures  . Microscopic Examination  . MM DIGITAL SCREENING BILATERAL  . Urinalysis, Routine w reflex microscopic      Time spent: [place], MD  Internal Medicine

## 2018-03-02 LAB — URINALYSIS, ROUTINE W REFLEX MICROSCOPIC
Bilirubin, UA: NEGATIVE
GLUCOSE, UA: NEGATIVE
KETONES UA: NEGATIVE
Nitrite, UA: NEGATIVE
PROTEIN UA: NEGATIVE
RBC, UA: NEGATIVE
SPEC GRAV UA: 1.022 (ref 1.005–1.030)
Urobilinogen, Ur: 1 mg/dL (ref 0.2–1.0)
pH, UA: 5 (ref 5.0–7.5)

## 2018-03-02 LAB — MICROSCOPIC EXAMINATION
Casts: NONE SEEN /lpf
Epithelial Cells (non renal): >10 /hpf — AB (ref 0–10)

## 2018-03-07 NOTE — Progress Notes (Signed)
No show

## 2018-03-09 ENCOUNTER — Ambulatory Visit: Payer: Managed Care, Other (non HMO) | Admitting: Nurse Practitioner

## 2018-03-09 ENCOUNTER — Encounter: Payer: Self-pay | Admitting: Nurse Practitioner

## 2018-03-09 VITALS — BP 130/68 | HR 94 | Resp 16 | Ht 59.0 in | Wt 132.0 lb

## 2018-03-09 DIAGNOSIS — D860 Sarcoidosis of lung: Secondary | ICD-10-CM | POA: Diagnosis not present

## 2018-03-09 DIAGNOSIS — J189 Pneumonia, unspecified organism: Secondary | ICD-10-CM | POA: Diagnosis not present

## 2018-03-09 DIAGNOSIS — I10 Essential (primary) hypertension: Secondary | ICD-10-CM | POA: Diagnosis not present

## 2018-03-09 DIAGNOSIS — R0602 Shortness of breath: Secondary | ICD-10-CM

## 2018-03-09 DIAGNOSIS — R0902 Hypoxemia: Secondary | ICD-10-CM

## 2018-03-09 NOTE — Progress Notes (Signed)
[facility] [address] [place], [place] [address]  Internal MEDICINE  Office Visit Note  Patient Name: Marissa Luna  [id number]  [id number]  Date of Service: 04/01/2018   Pt is here for routine follow up.   Chief Complaint  Patient presents with  . Shortness of Breath    modifications in nasal cannula    The patent is here for follow up visit.  Recently hospitalized for pneumonia. Has been out of work due to persistent infection and hypoxia. She is still on last round of antibiotics. Still using oxygen at 2 liters per minute. Today, breathing is easier. Able to be more active. Does get winded when exerting herself.  The patient needs to go back to work. Still tires easily and gets short of breath after a little while. O2 saturations are good without oxygen.     Current Medication: Outpatient Encounter Medications as of 03/09/2018  Medication Sig  . ALBUTEROL SULFATE IN   . ALPRAZolam (XANAX) 0.25 MG tablet Take 1 tablet (0.25 mg total) by mouth 2 (two) times daily as needed for anxiety.  Marland Kitchen. amLODipine (NORVASC) 10 MG tablet Take 10 mg by mouth daily.  Marland Kitchen. amLODipine (NORVASC) 5 MG tablet Take 5 mg by mouth daily.  Marland Kitchen. amoxicillin (AMOXIL) 875 MG tablet Take 1 tablet (875 mg total) by mouth 2 (two) times daily.  Marland Kitchen. azithromycin (ZITHROMAX) 250 MG tablet z-pack - take as directed for 5 days  . Calcium Carbonate-Vitamin D 600-400 MG-UNIT tablet Take by mouth.  . cephALEXin (KEFLEX) 500 MG capsule   . cetirizine (ZYRTEC) 10 MG tablet Take 10 mg by mouth.  . fluticasone (FLONASE) 50 MCG/ACT nasal spray Place into the nose.  . fluticasone furoate-vilanterol (BREO ELLIPTA) 100-25 MCG/INH AEPB   . folic acid (FOLVITE) 1 MG tablet Take 1 mg by mouth.  Marland Kitchen. ibuprofen (ADVIL,MOTRIN) 800 MG tablet Take 800 mg by mouth.  . levofloxacin (LEVAQUIN) 500 MG tablet Take 1 tablet (500 mg total) by mouth daily.  . methotrexate (RHEUMATREX) 2.5 MG tablet Take by mouth.  . Polyethylene Glycol  3350 (PEG 3350) POWD Take as directed for colonoscopy prep.  . predniSONE (DELTASONE) 5 MG tablet Take 1 tablet (5 mg total) by mouth daily with breakfast.  . predniSONE (STERAPRED UNI-PAK 21 TAB) 10 MG (21) TBPK tablet 6 day taper - take by mouth as directed for 6 days  . PROAIR HFA 108 (90 Base) MCG/ACT inhaler Inhale 2 puffs into the lungs every 4 (four) hours as needed for wheezing or shortness of breath.   . promethazine-codeine (PHENERGAN WITH CODEINE) 6.25-10 MG/5ML syrup Take 5 mLs by mouth every 8 (eight) hours as needed for cough.  Marland Kitchen. rOPINIRole (REQUIP) 0.25 MG tablet nightly as needed.  . tiotropium (SPIRIVA) 18 MCG inhalation capsule Place 18 mcg into inhaler and inhale daily.  . TRELEGY ELLIPTA 100-62.5-25 MCG/INH AEPB   . triamcinolone ointment (KENALOG) 0.1 % Apply twice daily as needed to affected areas on body and face  . Vitamin D, Ergocalciferol, (DRISDOL) 50000 units CAPS capsule Take 1 capsule (50,000 Units total) by mouth once a week.   No facility-administered encounter medications on file as of 03/09/2018.     Surgical History: Past Surgical History:  Procedure Laterality Date  . CHOLECYSTECTOMY    . COLONOSCOPY N/A 10/26/2015   Procedure: COLONOSCOPY;  Surgeon: Wallace CullensPaul Y Oh, MD;  [facility];  Service: Gastroenterology;  Laterality: N/A;    Medical History: Past Medical History:  Diagnosis Date  .  Asthma   . Environmental allergies   . Hypertension   . Sarcoidosis     Family History: Family History  Problem Relation Age of Onset  . Lung cancer Mother   . Colon cancer Father   . Diabetes Sister     Social History   Socioeconomic History  . Marital status: Married    Spouse name: Not on file  . Number of children: Not on file  . Years of education: Not on file  . Highest education level: Not on file  Occupational History  . Not on file  Social Needs  . Financial resource strain: Not on file  . Food insecurity:    Worry: Not on file     Inability: Not on file  . Transportation needs:    Medical: Not on file    Non-medical: Not on file  Tobacco Use  . Smoking status: Never Smoker  . Smokeless tobacco: Never Used  Substance and Sexual Activity  . Alcohol use: No  . Drug use: No  . Sexual activity: Not on file  Lifestyle  . Physical activity:    Days per week: Not on file    Minutes per session: Not on file  . Stress: Not on file  Relationships  . Social connections:    Talks on phone: Not on file    Gets together: Not on file    Attends religious service: Not on file    Active member of club or organization: Not on file    Attends meetings of clubs or organizations: Not on file    Relationship status: Not on file  . Intimate partner violence:    Fear of current or ex partner: Not on file    Emotionally abused: Not on file    Physically abused: Not on file    Forced sexual activity: Not on file  Other Topics Concern  . Not on file  Social History Narrative  . Not on file      Review of Systems  Constitutional: Positive for activity change and fatigue.  HENT: Positive for postnasal drip. Negative for congestion, rhinorrhea, sinus pressure, sinus pain, sore throat and voice change.   Eyes: Negative.   Respiratory: Positive for shortness of breath and wheezing. Negative for cough.   Cardiovascular: Negative for chest pain and palpitations.  Gastrointestinal: Negative for constipation, diarrhea, nausea and vomiting.  Endocrine: Negative for cold intolerance, heat intolerance, polydipsia, polyphagia and polyuria.  Genitourinary: Negative.   Musculoskeletal: Negative for arthralgias, back pain and myalgias.  Skin: Negative for rash.  Allergic/Immunologic: Positive for environmental allergies.  Neurological: Positive for weakness and headaches.  Psychiatric/Behavioral: Negative for agitation and behavioral problems. The patient is nervous/anxious.     Today's Vitals   03/09/18 1041 03/09/18 1043  BP:  130/68   Pulse: 94   Resp: 16   SpO2: 96% 96%  Weight: 132 lb (59.9 kg)   Height: 4\' 11"  (1.499 m)     Physical Exam  Constitutional: She is oriented to person, place, and time. She appears well-developed and well-nourished. She appears ill. No distress. Nasal cannula in place.  HENT:  Head: Normocephalic and atraumatic.  Right Ear: Tympanic membrane is erythematous and bulging.  Left Ear: Tympanic membrane is erythematous and bulging.  Nose: Rhinorrhea present. Right sinus exhibits maxillary sinus tenderness and frontal sinus tenderness. Left sinus exhibits maxillary sinus tenderness and frontal sinus tenderness.  Mouth/Throat: Posterior oropharyngeal erythema present. No oropharyngeal exudate.  Eyes: Pupils are equal, round, and  reactive to light. EOM are normal.  Neck: Normal range of motion. Neck supple. No JVD present. No tracheal deviation present. No thyromegaly present.  Cardiovascular: Normal rate and regular rhythm. Exam reveals no gallop and no friction rub.  Murmur heard. Mild tachycardia  Pulmonary/Chest: Effort normal. No respiratory distress. She has decreased breath sounds. She has no wheezes. She has no rales. She exhibits no tenderness.  Some expiratory congestion in left lower lebe of the lung. Breath sounds are otherwise clear and equal throughout lung fields.   Abdominal: Soft. Bowel sounds are normal. There is no tenderness.  Musculoskeletal: Normal range of motion.  Lymphadenopathy:    She has no cervical adenopathy.  Neurological: She is alert and oriented to person, place, and time. No cranial nerve deficit.  Skin: Skin is warm and dry. She is not diaphoretic.  Psychiatric: She has a normal mood and affect. Her behavior is normal. Judgment and thought content normal.  Nursing note and vitals reviewed.   Assessment/Plan:  1. Shortness of breath Improving, but still needing to use oxygen when exerting herself and at night. Will continue to keep her out of  work until evaluated by pulmonology.   2. Hypoxemia Continue oxygen as previously prescribed. Will see pulmonology next week regarding continued need for consistent oxygen use.   3. Community acquired pneumonia of left lung, unspecified part of lung Resolved.   4. Sarcoidosis of lung (HCC) Contributed to severity of shortness of breath and hypoxia when diagnosed with CAP. Will continue tto monitor.   5. Essential (primary) hypertension Stable. Continue bp medication as prescribed.  General Counseling: Marissa Luna verbalizes understanding of the findings of todays visit and agrees with plan of treatment. I have discussed any further diagnostic evaluation that may be needed or ordered today. We also reviewed her medications today. she has been encouraged to call the office with any questions or concerns that should arise related to todays visit.   This patient was seen by Vincent Gros, FNP- C in Collaboration with Dr Lyndon Code as a part of collaborative care agreement  Time spent: 25 Minutes     Dr Lyndon Code Internal medicine

## 2018-03-12 ENCOUNTER — Telehealth: Payer: Self-pay

## 2018-03-13 ENCOUNTER — Encounter: Payer: Self-pay | Admitting: Internal Medicine

## 2018-03-13 ENCOUNTER — Ambulatory Visit (INDEPENDENT_AMBULATORY_CARE_PROVIDER_SITE_OTHER): Payer: Managed Care, Other (non HMO) | Admitting: Internal Medicine

## 2018-03-13 VITALS — BP 110/64 | HR 99 | Resp 16 | Ht <= 58 in | Wt 133.8 lb

## 2018-03-13 DIAGNOSIS — J9611 Chronic respiratory failure with hypoxia: Secondary | ICD-10-CM

## 2018-03-13 DIAGNOSIS — D860 Sarcoidosis of lung: Secondary | ICD-10-CM

## 2018-03-13 DIAGNOSIS — R0602 Shortness of breath: Secondary | ICD-10-CM

## 2018-03-13 DIAGNOSIS — Z9981 Dependence on supplemental oxygen: Secondary | ICD-10-CM | POA: Diagnosis not present

## 2018-03-13 NOTE — Patient Instructions (Signed)
Sarcoidosis Sarcoidosis is a disease that causes inflammation in your organs and other areas of your body. The lungs are most often affected (pulmonary sarcoidosis). Sarcoidosis can also affect your lymph nodes, liver, eyes, skin, or any other body tissue. When you have sarcoidosis, small clumps of tissue (granulomas) form in the affected area of your body. Granulomas are made up of your body's defense (immune) cells. Inflammation results when your body reacts to a harmful substance. Normally, inflammation goes away after immune cells get rid of the harmful substance. In sarcoidosis, the immune cells form granulomas instead. What are the causes? The exact cause of sarcoidosis is not known. Something triggers the immune system to respond, such as dust, chemicals, bacteria, or a virus. What increases the risk? You may be at a greater risk for sarcoidosis if you:  Have a family history of the disease.  Are African American.  Are of Northern European ancestry.  Are 20-50 years old.  Are female.  What are the signs or symptoms? Many people with sarcoidosis have no symptoms. Others have very mild symptoms. Sarcoidosis most often affects the lungs. Symptoms include:  Chest pain.  Coughing.  Wheezing.  Shortness of breath.  Other common symptoms include:  Night sweats.  Weight loss.  Fatigue.  Depression.  A sense of uneasiness.  How is this diagnosed? Sarcoidosis may be diagnosed by:  Medical history and physical exam.  Chest X-ray. This looks for granulomas in your lungs.  Lung function tests. These measure your breathing and look for problems related to sarcoidosis.  Examining a sample of tissue under a microscope (biopsy).  How is this treated? Sarcoidosis usually clears up without treatment. You may take medicines to reduce inflammation or relieve symptoms. These may include:  Prednisone. This steroid reduces inflammation related to sarcoidosis.  Chloroquine or  hydroxychloroquine. These are antimalarial medicines used to treat sarcoidosis that affects the skin or brain.  Methotrexate, leflunomide, or azathioprine. These medicines affect the immune system and can help with sarcoidosis in the joints, eyes, skin, or lungs.  Inhalers. Inhaled medicines can help you breathe if sarcoidosis is affecting your lungs.  Follow these instructions at home:  Do not use any tobacco products, including cigarettes, chewing tobacco, or electronic cigarettes. If you need help quitting, ask your health care provider.  Avoid secondhand smoke.  Avoid irritating dust and chemicals. Stay indoors on days when air quality is poor in your area.  Take medicines only as directed by your health care provider. Contact a health care provider if:  You have vision problems.  You have shortness of breath.  You have a dry, persistent cough.  You have an irregular heartbeat.  You have pain or ache in your joints, hands, or feet.  You have an unexplained rash. Get help right away if: You have chest pain. This information is not intended to replace advice given to you by your health care provider. Make sure you discuss any questions you have with your health care provider. Document Released: 09/21/2004 Document Revised: 04/28/2016 Document Reviewed: 03/19/2014 [employer] Interactive Patient Education  2018 [employer]  

## 2018-03-13 NOTE — Progress Notes (Signed)
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  She has significant desaturation with any kind of activity.  Her job is apparently telling her that she cannot work when she has oxygen.  Unfortunately she has a job where  She is walking around and she has significant desaturations when she walks around.  She is otherwise actually been doing fairly well no recent admissions to the hospital  ROS  General: (-) fever, (-) chills, (-) night sweats, (-) weakness Skin: (-) rashes, (-) itching,. Eyes: (-) visual changes, (-) redness, (-) itching. Nose and Sinuses: (-) nasal stuffiness or itchiness, (-) postnasal drip, (-) nosebleeds, (-) sinus trouble. Mouth and Throat: (-) sore throat, (-) hoarseness. Neck: (-) swollen glands, (-) enlarged thyroid, (-) neck pain. Respiratory: + cough, (-) bloody sputum, + shortness of breath, - wheezing. Cardiovascular: - ankle swelling, (-) chest pain. Lymphatic: (-) lymph node enlargement. Neurologic: (-) numbness, (-) tingling. Psychiatric: (-) anxiety, (-) depression   Current Medication: Outpatient Encounter Medications as of 03/13/2018  Medication Sig  . ALBUTEROL SULFATE IN   . ALPRAZolam (XANAX) 0.25 MG tablet Take 1 tablet (0.25 mg total) by mouth 2 (two) times daily as needed for anxiety.  Marland Kitchen amLODipine (NORVASC) 10 MG tablet Take 10 mg by mouth daily.  Marland Kitchen amLODipine (NORVASC) 5 MG tablet Take 5 mg by mouth daily.  Marland Kitchen amoxicillin (AMOXIL) 875 MG tablet Take 1 tablet (875 mg total) by mouth 2 (two) times daily.  Marland Kitchen azithromycin (ZITHROMAX) 250 MG tablet z-pack - take as directed for 5 days  . Calcium Carbonate-Vitamin D 600-400 MG-UNIT tablet Take by mouth.  . cephALEXin (KEFLEX) 500  MG capsule   . cetirizine (ZYRTEC) 10 MG tablet Take 10 mg by mouth.  . fluticasone (FLONASE) 50 MCG/ACT nasal spray Place into the nose.  . fluticasone furoate-vilanterol (BREO ELLIPTA) 100-25 MCG/INH AEPB   . folic acid (FOLVITE) 1 MG tablet Take 1 mg by mouth.  Marland Kitchen ibuprofen (ADVIL,MOTRIN) 800 MG tablet Take 800 mg by mouth.  . levofloxacin (LEVAQUIN) 500 MG tablet Take 1 tablet (500 mg total) by mouth daily.  . methotrexate (RHEUMATREX) 2.5 MG tablet Take by mouth.  . Polyethylene Glycol 3350 (PEG 3350) POWD Take as directed for colonoscopy prep.  . predniSONE (DELTASONE) 5 MG tablet Take 1 tablet (5 mg total) by mouth daily with breakfast.  . predniSONE (STERAPRED UNI-PAK 21 TAB) 10 MG (21) TBPK tablet 6 day taper - take by mouth as directed for 6 days  . PROAIR HFA 108 (90 Base) MCG/ACT inhaler Inhale 2 puffs into the lungs every 4 (four) hours as needed for wheezing or shortness of breath.   . promethazine-codeine (PHENERGAN WITH CODEINE) 6.25-10 MG/5ML syrup Take 5 mLs by mouth every 8 (eight) hours as needed for cough.  Marland Kitchen rOPINIRole (REQUIP) 0.25 MG tablet nightly as needed.  . tiotropium (SPIRIVA) 18 MCG inhalation capsule Place 18 mcg into inhaler and inhale daily.  . TRELEGY ELLIPTA 100-62.5-25 MCG/INH AEPB   . triamcinolone ointment (KENALOG) 0.1 % Apply twice daily as needed to affected areas on body and face  . Vitamin D, Ergocalciferol, (DRISDOL) 50000 units CAPS capsule Take 1 capsule (50,000 Units total) by mouth once a week.   No facility-administered encounter  medications on file as of 03/13/2018.     Surgical History: Past Surgical History:  Procedure Laterality Date  . CHOLECYSTECTOMY    . COLONOSCOPY N/A 10/26/2015   Procedure: COLONOSCOPY;  Surgeon: Hulen Luster, MD;  Location: [facility] ENDOSCOPY;  Service: Gastroenterology;  Laterality: N/A;    Medical History: Past Medical History:  Diagnosis Date  . Asthma   . Environmental allergies   . Hypertension   .  Sarcoidosis     Family History: Family History  Problem Relation Age of Onset  . Lung cancer Mother   . Colon cancer Father   . Diabetes Sister     Social History: Social History   Socioeconomic History  . Marital status: Married    Spouse name: Not on file  . Number of children: Not on file  . Years of education: Not on file  . Highest education level: Not on file  Occupational History  . Not on file  Social Needs  . Financial resource strain: Not on file  . Food insecurity:    Worry: Not on file    Inability: Not on file  . Transportation needs:    Medical: Not on file    Non-medical: Not on file  Tobacco Use  . Smoking status: Never Smoker  . Smokeless tobacco: Never Used  Substance and Sexual Activity  . Alcohol use: No  . Drug use: No  . Sexual activity: Not on file  Lifestyle  . Physical activity:    Days per week: Not on file    Minutes per session: Not on file  . Stress: Not on file  Relationships  . Social connections:    Talks on phone: Not on file    Gets together: Not on file    Attends religious service: Not on file    Active member of club or organization: Not on file    Attends meetings of clubs or organizations: Not on file    Relationship status: Not on file  . Intimate partner violence:    Fear of current or ex partner: Not on file    Emotionally abused: Not on file    Physically abused: Not on file    Forced sexual activity: Not on file  Other Topics Concern  . Not on file  Social History Narrative  . Not on file    Vital Signs: Blood pressure 110/64, pulse 99, resp. rate 16, height '4\' 9"'$  (1.448 m), weight 133 lb 12.8 oz (60.7 kg), SpO2 93 %.  Examination: General Appearance: The patient is well-developed, well-nourished, and in no distress. Skin: Gross inspection of skin unremarkable. Head: normocephalic, no gross deformities. Eyes: no gross deformities noted. ENT: ears appear grossly normal no exudates. Neck: Supple. No  thyromegaly. No LAD. Respiratory:  Scattered rhonchi are noted bilaterally. Cardiovascular: Normal S1 and S2 without murmur or rub. Extremities: No cyanosis. pulses are equal. Neurologic: Alert and oriented. No involuntary movements.  LABS: Recent Results (from the past 2160 hour(s))  POCT Influenza A/B     Status: None   Collection Time: 01/11/18 12:31 PM  Result Value Ref Range   Influenza A, POC Negative Negative   Influenza B, POC Negative Negative  Blood Culture (routine x 2)     Status: None   Collection Time: 01/11/18  8:59 PM  Result Value Ref Range   Specimen Description BLOOD RIGHT AC    Special Requests      BOTTLES DRAWN AEROBIC AND ANAEROBIC Blood Culture adequate volume  Culture      NO GROWTH 5 DAYS Performed at [facility], [address] [place], [place] [address]    Report Status 01/16/2018 FINAL   Comprehensive metabolic panel     Status: Abnormal   Collection Time: 01/11/18  9:00 PM  Result Value Ref Range   Sodium 138 135 - 145 mmol/L   Potassium 3.9 3.5 - 5.1 mmol/L   Chloride 102 101 - 111 mmol/L   CO2 29 22 - 32 mmol/L   Glucose, Bld 126 (H) 65 - 99 mg/dL   BUN 17 6 - 20 mg/dL   Creatinine, Ser 1.16 (H) 0.44 - 1.00 mg/dL   Calcium 9.0 8.9 - 10.3 mg/dL   Total Protein 8.3 (H) 6.5 - 8.1 g/dL   Albumin 3.7 3.5 - 5.0 g/dL   AST 22 15 - 41 U/L   ALT 15 14 - 54 U/L   Alkaline Phosphatase 68 38 - 126 U/L   Total Bilirubin 0.4 0.3 - 1.2 mg/dL   GFR calc non Af Amer 49 (L) >60 mL/min   GFR calc Af Amer 57 (L) >60 mL/min    Comment: (NOTE) The eGFR has been calculated using the CKD EPI equation. This calculation has not been validated in all clinical situations. eGFR's persistently <60 mL/min signify possible Chronic Kidney Disease.    Anion gap 7 5 - 15    Comment: Performed at [facility], [address] [place], [place] [address]  CBC WITH DIFFERENTIAL     Status: Abnormal   Collection Time: 01/11/18  9:00 PM  Result Value  Ref Range   WBC 9.5 3.6 - 11.0 K/uL   RBC 4.54 3.80 - 5.20 MIL/uL   Hemoglobin 12.7 12.0 - 16.0 g/dL   HCT 39.6 35.0 - 47.0 %   MCV 87.2 80.0 - 100.0 fL   MCH 28.0 26.0 - 34.0 pg   MCHC 32.1 32.0 - 36.0 g/dL   RDW 13.7 11.5 - 14.5 %   Platelets 271 150 - 440 K/uL   Neutrophils Relative % 91 %   Neutro Abs 8.6 (H) 1.4 - 6.5 K/uL   Lymphocytes Relative 5 %   Lymphs Abs 0.5 (L) 1.0 - 3.6 K/uL   Monocytes Relative 4 %   Monocytes Absolute 0.3 0.2 - 0.9 K/uL   Eosinophils Relative 0 %   Eosinophils Absolute 0.0 0 - 0.7 K/uL   Basophils Relative 0 %   Basophils Absolute 0.0 0 - 0.1 K/uL    Comment: Performed at [facility], [address] [place], [place] [address]  Blood Culture (routine x 2)     Status: None   Collection Time: 01/11/18  9:00 PM  Result Value Ref Range   Specimen Description BLOOD BLOOD RIGHT FOREARM    Special Requests      BOTTLES DRAWN AEROBIC AND ANAEROBIC Blood Culture adequate volume   Culture      NO GROWTH 5 DAYS Performed at [facility], [address] [place], [place] [address]    Report Status 01/16/2018 FINAL   Urinalysis, Routine w reflex microscopic     Status: Abnormal   Collection Time: 01/11/18  9:00 PM  Result Value Ref Range   Color, Urine YELLOW (A) YELLOW   APPearance HAZY (A) CLEAR   Specific Gravity, Urine 1.010 1.005 - 1.030   pH 6.0 5.0 - 8.0   Glucose, UA NEGATIVE NEGATIVE mg/dL   Hgb urine dipstick SMALL (A) NEGATIVE   Bilirubin Urine  NEGATIVE NEGATIVE   Ketones, ur NEGATIVE NEGATIVE mg/dL   Protein, ur NEGATIVE NEGATIVE mg/dL   Nitrite NEGATIVE NEGATIVE   Leukocytes, UA NEGATIVE NEGATIVE   RBC / HPF 0-5 0 - 5 RBC/hpf   WBC, UA 0-5 0 - 5 WBC/hpf   Bacteria, UA RARE (A) NONE SEEN   Squamous Epithelial / LPF 0-5 (A) NONE SEEN   Mucus PRESENT     Comment: Performed at [facility], [address] [place], [place] [address]  Lactic acid, plasma     Status: None   Collection Time: 01/11/18  9:00  PM  Result Value Ref Range   Lactic Acid, Venous 1.1 0.5 - 1.9 mmol/L    Comment: Performed at [facility], [address], [place], [place] [address]  Urine culture     Status: Abnormal   Collection Time: 01/11/18  9:00 PM  Result Value Ref Range   Specimen Description      URINE, RANDOM Performed at [facility], [address], [place], [place] [address]    Special Requests      NONE Performed at [facility], [address], [place], [place] [address]    Culture MULTIPLE SPECIES PRESENT, SUGGEST RECOLLECTION (A)    Report Status 01/13/2018 FINAL   Influenza panel by PCR (type A & B)     Status: Abnormal   Collection Time: 01/11/18  9:00 PM  Result Value Ref Range   Influenza A By PCR POSITIVE (A) NEGATIVE   Influenza B By PCR NEGATIVE NEGATIVE    Comment: (NOTE) The Xpert Xpress Flu assay is intended as an aid in the diagnosis of  influenza and should not be used as a sole basis for treatment.  This  assay is FDA approved for nasopharyngeal swab specimens only. Nasal  washings and aspirates are unacceptable for Xpert Xpress Flu testing. Performed at [facility], [address] [place], [place] [address]   HIV antibody (Routine Testing)     Status: None   Collection Time: 01/12/18  3:54 AM  Result Value Ref Range   HIV Screen 4th Generation wRfx Non Reactive Non Reactive    Comment: (NOTE) Performed At: [facility] [place] [place], [place] [id number] Rush Farmer MD [id number] Performed at [facility], [address] [place], [place] [address]   Basic metabolic panel     Status: Abnormal   Collection Time: 01/12/18  3:54 AM  Result Value Ref Range   Sodium 140 135 - 145 mmol/L   Potassium 4.3 3.5 - 5.1 mmol/L   Chloride 108 101 - 111 mmol/L   CO2 24 22 - 32 mmol/L   Glucose, Bld 147 (H) 65 - 99 mg/dL   BUN 14 6 - 20 mg/dL   Creatinine, Ser 1.04 (H) 0.44 - 1.00 mg/dL   Calcium 7.9 (L) 8.9  - 10.3 mg/dL   GFR calc non Af Amer 56 (L) >60 mL/min   GFR calc Af Amer >60 >60 mL/min    Comment: (NOTE) The eGFR has been calculated using the CKD EPI equation. This calculation has not been validated in all clinical situations. eGFR's persistently <60 mL/min signify possible Chronic Kidney Disease.    Anion gap 8 5 - 15    Comment: Performed at [facility], [address] [place],  [address]  CBC     Status: Abnormal   Collection Time: 01/12/18  3:54 AM  Result Value Ref Range   WBC 9.1  3.6 - 11.0 K/uL   RBC 3.89 3.80 - 5.20 MIL/uL   Hemoglobin 11.5 (L) 12.0 - 16.0 g/dL   HCT 36.0 35.0 - 47.0 %   MCV 92.7 80.0 - 100.0 fL   MCH 29.6 26.0 - 34.0 pg   MCHC 31.9 (L) 32.0 - 36.0 g/dL   RDW 14.8 (H) 11.5 - 14.5 %   Platelets 239 150 - 440 K/uL    Comment: Performed at [facility], [address] [place], [place] [address]  Procalcitonin - Baseline     Status: None   Collection Time: 01/12/18  3:54 AM  Result Value Ref Range   Procalcitonin 3.03 ng/mL    Comment:        Interpretation: PCT > 2 ng/mL: Systemic infection (sepsis) is likely, unless other causes are known. (NOTE)       Sepsis PCT Algorithm           Lower Respiratory Tract                                      Infection PCT Algorithm    ----------------------------     ----------------------------         PCT < 0.25 ng/mL                PCT < 0.10 ng/mL         Strongly encourage             Strongly discourage   discontinuation of antibiotics    initiation of antibiotics    ----------------------------     -----------------------------       PCT 0.25 - 0.50 ng/mL            PCT 0.10 - 0.25 ng/mL               OR       >80% decrease in PCT            Discourage initiation of                                            antibiotics      Encourage discontinuation           of antibiotics    ----------------------------     -----------------------------         PCT >= 0.50 ng/mL               PCT 0.26 - 0.50 ng/mL               AND       <80% decrease in PCT              Encourage initiation of                                             antibiotics       Encourage continuation           of antibiotics    ----------------------------     -----------------------------        PCT >= 0.50 ng/mL  PCT > 0.50 ng/mL               AND         increase in PCT                  Strongly encourage                                      initiation of antibiotics    Strongly encourage escalation           of antibiotics                                     -----------------------------                                           PCT <= 0.25 ng/mL                                                 OR                                        > 80% decrease in PCT                                     Discontinue / Do not initiate                                             antibiotics Performed at [facility], [address] [place], [place] [address]   MRSA PCR Screening     Status: Abnormal   Collection Time: 01/12/18  9:45 AM  Result Value Ref Range   MRSA by PCR POSITIVE (A) NEGATIVE    Comment:        The GeneXpert MRSA Assay (FDA approved for NASAL specimens only), is one component of a comprehensive MRSA colonization surveillance program. It is not intended to diagnose MRSA infection nor to guide or monitor treatment for MRSA infections. RESULT CALLED TO, READ BACK BY AND VERIFIED WITH: [facility] PEREZ AT 1142 01/12/18 SDR Performed at [facility], [address] [place], [place] [address]   Procalcitonin     Status: None   Collection Time: 01/13/18  5:04 AM  Result Value Ref Range   Procalcitonin 3.65 ng/mL    Comment:        Interpretation: PCT > 2 ng/mL: Systemic infection (sepsis) is likely, unless other causes are known. (NOTE)       Sepsis PCT Algorithm           Lower Respiratory Tract  Infection PCT Algorithm    ----------------------------     ----------------------------         PCT < 0.25 ng/mL                PCT < 0.10 ng/mL         Strongly encourage             Strongly discourage   discontinuation of antibiotics    initiation of antibiotics    ----------------------------     -----------------------------       PCT 0.25 - 0.50 ng/mL            PCT 0.10 - 0.25 ng/mL               OR       >80% decrease in PCT            Discourage initiation of                                            antibiotics      Encourage discontinuation           of antibiotics    ----------------------------     -----------------------------         PCT >= 0.50 ng/mL              PCT 0.26 - 0.50 ng/mL               AND       <80% decrease in PCT              Encourage initiation of                                             antibiotics       Encourage continuation           of antibiotics    ----------------------------     -----------------------------        PCT >= 0.50 ng/mL                  PCT > 0.50 ng/mL               AND         increase in PCT                  Strongly encourage                                      initiation of antibiotics    Strongly encourage escalation           of antibiotics                                     -----------------------------                                           PCT <= 0.25 ng/mL  OR                                        > 80% decrease in PCT                                     Discontinue / Do not initiate                                             antibiotics Performed at [facility], [address] [place], [place] [address]   Creatinine, serum     Status: Abnormal   Collection Time: 01/14/18  4:28 AM  Result Value Ref Range   Creatinine, Ser 1.05 (H) 0.44 - 1.00 mg/dL   GFR calc non Af Amer 56 (L) >60 mL/min   GFR calc Af Amer >60 >60 mL/min    Comment:  (NOTE) The eGFR has been calculated using the CKD EPI equation. This calculation has not been validated in all clinical situations. eGFR's persistently <60 mL/min signify possible Chronic Kidney Disease. Performed at [facility], [address] [place], [place] [address]   Angiotensin converting enzyme     Status: None   Collection Time: 01/14/18  4:29 AM  Result Value Ref Range   Angiotensin-Converting Enzyme 30 14 - 82 U/L    Comment: (NOTE) Performed At: [facility] [place], [place] [id number] Rush Farmer MD ON:[id number] Performed at [facility], [address] [place], [place] [address]   CBC w/Diff/Platelet     Status: Abnormal   Collection Time: 02/12/18  2:43 PM  Result Value Ref Range   WBC 11.2 (H) 3.4 - 10.8 x10E3/uL   RBC 4.46 3.77 - 5.28 x10E6/uL   Hemoglobin 12.6 11.1 - 15.9 g/dL   Hematocrit 39.5 34.0 - 46.6 %   MCV 89 79 - 97 fL   MCH 28.3 26.6 - 33.0 pg   MCHC 31.9 31.5 - 35.7 g/dL   RDW 14.3 12.3 - 15.4 %   Platelets 259 150 - 379 x10E3/uL   Neutrophils 80 Not Estab. %   Lymphs 10 Not Estab. %   Monocytes 5 Not Estab. %   Eos 4 Not Estab. %   Basos 0 Not Estab. %   Neutrophils Absolute 9.0 (H) 1.4 - 7.0 x10E3/uL   Lymphocytes Absolute 1.1 0.7 - 3.1 x10E3/uL   Monocytes Absolute 0.6 0.1 - 0.9 x10E3/uL   EOS (ABSOLUTE) 0.5 (H) 0.0 - 0.4 x10E3/uL   Basophils Absolute 0.0 0.0 - 0.2 x10E3/uL   Immature Granulocytes 1 Not Estab. %   Immature Grans (Abs) 0.1 0.0 - 0.1 x10E3/uL  Comprehensive Metabolic Panel (CMET)     Status: Abnormal   Collection Time: 02/12/18  2:43 PM  Result Value Ref Range   Glucose 191 (H) 65 - 99 mg/dL   BUN 23 8 - 27 mg/dL   Creatinine, Ser 1.12 (H) 0.57 - 1.00 mg/dL   GFR calc non Af Amer 53 (L) >59 mL/min/1.73   GFR calc Af Amer 61 >59 mL/min/1.73   BUN/Creatinine Ratio 21 12 - 28   Sodium 146 (H) 134 - 144 mmol/L   Potassium 4.3 3.5 - 5.2 mmol/L  Chloride 105 96 - 106  mmol/L   CO2 24 20 - 29 mmol/L   Calcium 9.1 8.7 - 10.3 mg/dL   Total Protein 6.7 6.0 - 8.5 g/dL   Albumin 3.7 3.6 - 4.8 g/dL   Globulin, Total 3.0 1.5 - 4.5 g/dL   Albumin/Globulin Ratio 1.2 1.2 - 2.2   Bilirubin Total 0.3 0.0 - 1.2 mg/dL   Alkaline Phosphatase 74 39 - 117 IU/L   AST 11 0 - 40 IU/L   ALT 11 0 - 32 IU/L  POCT glycosylated hemoglobin (Hb A1C)     Status: None   Collection Time: 02/22/18  9:29 AM  Result Value Ref Range   Hemoglobin A1C 6.6   CBC     Status: None   Collection Time: 02/27/18  2:00 PM  Result Value Ref Range   WBC 7.9 3.4 - 10.8 x10E3/uL   RBC 4.37 3.77 - 5.28 x10E6/uL   Hemoglobin 12.3 11.1 - 15.9 g/dL   Hematocrit 39.1 34.0 - 46.6 %   MCV 90 79 - 97 fL   MCH 28.1 26.6 - 33.0 pg   MCHC 31.5 31.5 - 35.7 g/dL   RDW 14.1 12.3 - 15.4 %   Platelets 296 150 - 379 [id number]  Basic Metabolic Panel (BMET)     Status: Abnormal   Collection Time: 02/27/18  2:00 PM  Result Value Ref Range   Glucose 113 (H) 65 - 99 mg/dL   BUN 23 8 - 27 mg/dL   Creatinine, Ser 1.04 (H) 0.57 - 1.00 mg/dL   GFR calc non Af Amer 58 (L) >59 mL/min/1.73   GFR calc Af Amer 67 >59 mL/min/1.73   BUN/Creatinine Ratio 22 12 - 28   Sodium 142 134 - 144 mmol/L   Potassium 4.6 3.5 - 5.2 mmol/L   Chloride 103 96 - 106 mmol/L   CO2 25 20 - 29 mmol/L   Calcium 9.3 8.7 - 10.3 mg/dL  Urinalysis, Routine w reflex microscopic     Status: Abnormal   Collection Time: 03/01/18  3:48 PM  Result Value Ref Range   Specific Gravity, UA 1.022 1.005 - 1.030   pH, UA 5.0 5.0 - 7.5   Color, UA Yellow Yellow   Appearance Ur Clear Clear   Leukocytes, UA Trace (A) Negative   Protein, UA Negative Negative/Trace   Glucose, UA Negative Negative   Ketones, UA Negative Negative   RBC, UA Negative Negative   Bilirubin, UA Negative Negative   Urobilinogen, Ur 1.0 0.2 - 1.0 mg/dL   Nitrite, UA Negative Negative   Microscopic Examination See below:     Comment: Microscopic was indicated and was  performed.  Microscopic Examination     Status: Abnormal   Collection Time: 03/01/18  3:48 PM  Result Value Ref Range   WBC, UA 0-5 0 - 5 /hpf   RBC, UA 0-2 0 - 2 /hpf   Epithelial Cells (non renal) >10 (A) 0 - 10 /hpf   Casts None seen None seen /lpf   Crystals Present (A) N/A   Crystal Type Calcium Oxalate N/A   Mucus, UA Present Not Estab.   Bacteria, UA Few None seen/Few   Yeast, UA Present (A) None seen    Radiology: Dg Chest 2 View  Result Date: 02/12/2018 CLINICAL DATA:  History of pneumonia with shortness of Breath EXAM: CHEST - 2 VIEW COMPARISON:  01/11/18, 02/11/2016 FINDINGS: Stable fibrotic changes are noted in the mid lungs bilaterally similar to that seen on  02/11/2016. The recently seen superimposed infiltrates on 01/11/2018 at resolved in the interval. No sizable effusion is noted. No other focal abnormality is noted. IMPRESSION: Chronic changes without acute abnormality. Previously seen infiltrates have resolved. Electronically Signed   By: Inez Catalina M.D.   On: 02/12/2018 20:01    No results found.  Dg Chest 2 View  Result Date: 02/12/2018 CLINICAL DATA:  History of pneumonia with shortness of Breath EXAM: CHEST - 2 VIEW COMPARISON:  01/11/18, 02/11/2016 FINDINGS: Stable fibrotic changes are noted in the mid lungs bilaterally similar to that seen on 02/11/2016. The recently seen superimposed infiltrates on 01/11/2018 at resolved in the interval. No sizable effusion is noted. No other focal abnormality is noted. IMPRESSION: Chronic changes without acute abnormality. Previously seen infiltrates have resolved. Electronically Signed   By: Inez Catalina M.D.   On: 02/12/2018 20:01      Assessment and Plan: Patient Active Problem List   Diagnosis Date Noted  . Acute bronchitis with COPD ([place]) 02/22/2018  . Lymphocytosis 02/22/2018  . Abnormal kidney function 02/22/2018  . Sepsis ([place]) 01/11/2018  . CAP (community acquired pneumonia) 01/11/2018  . Influenza A 01/11/2018   . Sarcoidosis of lung ([place]) 01/08/2018  . Hypoxemia 01/08/2018  . Allergic rhinitis, unspecified 01/08/2018  . Essential (primary) hypertension 01/08/2018  . 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 Shortness of breath 01/08/2018    1.  pulmonary sarcoidosis advanced disease she has chronic oxygen dependent.  She needs to be continued with oxygen therapy as has been prescribed to.  I do not think it is a good idea for to be without oxygen rest specially when she is ambulating.  If she is able to get a job where she is sedentary and does not have to walk then she may be able to go without oxygen as as noted by her 6 min walk study done here in the office today 2. Chronic oxygen dependence as noted above she needs to continue with her oxygen on the current flow rate.  When she was placed on oxygen and we did a 6 min walk on her she still had desaturations down to 88%.  Without oxygen she was desaturating down to 85% 3. Shortness of breath she will need follow-up pulmonary function is to reassess her lung capacities DLCO and spirometry get this set up for her.  General Counseling: I have discussed the findings of the evaluation and examination with Elsia.  I have also discussed any further diagnostic evaluation thatmay be needed or ordered today. Lillieann verbalizes understanding of the findings of todays visit. We also reviewed her medications today and discussed drug interactions and side effects including but not limited excessive drowsiness and altered mental states. We also discussed that there is always a risk not just to her but also people around her. she has been encouraged to call the office with any questions or concerns that should arise related to todays visit.    Time spent: 2mn  I have personally obtained a history, examined the patient, evaluated laboratory and imaging results, formulated the assessment and plan and placed orders.    SAllyne Gee MD [facility] Sleep [facility]

## 2018-03-14 ENCOUNTER — Telehealth: Payer: Self-pay

## 2018-03-14 NOTE — Telephone Encounter (Signed)
Note for work is ready to be picked up. Pt was called and said it will be picked up tomorrow 03/15/18.

## 2018-03-14 NOTE — Telephone Encounter (Signed)
Gave note to keep out of work through 03/30/2018. She will need to be seen for another evaluation and 6 minute walk in order to return to work. We will have to set up appointment.

## 2018-03-14 NOTE — Telephone Encounter (Signed)
Note for work

## 2018-03-14 NOTE — Telephone Encounter (Signed)
Since her appointment, knowing that she needs to keep oxygen, can you find out if there is anything else I need to give her? Does her work need additional paperwork or anything? thanks

## 2018-03-20 ENCOUNTER — Telehealth: Payer: Self-pay

## 2018-03-20 NOTE — Telephone Encounter (Signed)
Left message advising patient that her letter is now ready for pickup.titania

## 2018-03-25 DIAGNOSIS — Z124 Encounter for screening for malignant neoplasm of cervix: Secondary | ICD-10-CM | POA: Insufficient documentation

## 2018-03-25 DIAGNOSIS — Z1239 Encounter for other screening for malignant neoplasm of breast: Secondary | ICD-10-CM

## 2018-03-25 DIAGNOSIS — R300 Dysuria: Secondary | ICD-10-CM | POA: Insufficient documentation

## 2018-03-29 ENCOUNTER — Telehealth: Payer: Self-pay

## 2018-04-12 ENCOUNTER — Telehealth: Payer: Self-pay

## 2018-04-12 NOTE — Telephone Encounter (Signed)
Called pt to let her know revised work letter is ready to pick up.

## 2018-04-12 NOTE — Telephone Encounter (Signed)
Patient advised tat and [employer] are working on [employer]

## 2018-04-13 ENCOUNTER — Telehealth: Payer: Self-pay

## 2018-04-13 NOTE — Telephone Encounter (Signed)
Patients work called, Administrator from [employer] checking on the orders  that placed the pt out of work due to her using oxygen.  They say that pt can't use her oxygen in the workplace as it causes hazard to her and other workers.  They will follow up again in Aug after pt sees DSK for a follow up.  We have written a letter for pt to have restrictions on her job.  dbs

## 2018-04-17 ENCOUNTER — Other Ambulatory Visit: Payer: Self-pay | Admitting: Nurse Practitioner

## 2018-04-17 MED ORDER — PREDNISONE 5 MG PO TABS: 5.0000 mg | ORAL_TABLET | Freq: Every day | ORAL | 3 refills | Status: DC

## 2018-04-24 ENCOUNTER — Ambulatory Visit: Payer: Self-pay | Admitting: Internal Medicine

## 2018-05-02 ENCOUNTER — Other Ambulatory Visit: Payer: Self-pay | Admitting: Nurse Practitioner

## 2018-05-02 ENCOUNTER — Telehealth: Payer: Self-pay | Admitting: Nurse Practitioner

## 2018-05-02 DIAGNOSIS — J209 Acute bronchitis, unspecified: Secondary | ICD-10-CM

## 2018-05-02 DIAGNOSIS — R05 Cough: Secondary | ICD-10-CM

## 2018-05-02 DIAGNOSIS — J440 Chronic obstructive pulmonary disease with acute lower respiratory infection: Principal | ICD-10-CM

## 2018-05-02 DIAGNOSIS — R059 Cough, unspecified: Secondary | ICD-10-CM

## 2018-05-02 MED ORDER — CEFUROXIME AXETIL 500 MG PO TABS: 500.0000 mg | ORAL_TABLET | Freq: Two times a day (BID) | ORAL | 0 refills | Status: DC

## 2018-05-02 MED ORDER — PROMETHAZINE-CODEINE 6.25-10 MG/5ML PO SYRP: 5.0000 mL | ORAL_SOLUTION | Freq: Three times a day (TID) | ORAL | 0 refills | Status: DC | PRN

## 2018-05-02 NOTE — Progress Notes (Signed)
Patient c/o cough, congestion, headache, and wheezing. Sent in ceftin  twice daily for 10 days. Also sent promethazine/codeine cough suppressant. May take up to tid as needed for cough. [facility] [place]. Use OTC medication to relieve symptoms.

## 2018-05-02 NOTE — Telephone Encounter (Signed)
Pt states that she is congested , coughing , yellow mucous , unable to come in pt is not working at this time , is there something we can call in for her cough

## 2018-05-02 NOTE — Telephone Encounter (Signed)
Patient c/o cough, congestion, headache, and wheezing. Sent in ceftin 500mg twice daily for 10 days. Also sent promethazine/codeine cough suppressant. May take up to tid as needed for cough. [facility] [place]. Use OTC medication to relieve symptoms.  

## 2018-05-02 NOTE — Telephone Encounter (Signed)
Pt advised on medication called into the pharmacy and to use otc medication to help with symptoms

## 2018-05-15 ENCOUNTER — Other Ambulatory Visit: Payer: Self-pay | Admitting: Internal Medicine

## 2018-05-22 ENCOUNTER — Other Ambulatory Visit: Payer: Self-pay

## 2018-05-22 MED ORDER — VITAMIN D (ERGOCALCIFEROL) 1.25 MG (50000 UNIT) PO CAPS: 50000.0000 [IU] | ORAL_CAPSULE | ORAL | 5 refills | Status: DC

## 2018-06-12 ENCOUNTER — Encounter (INDEPENDENT_AMBULATORY_CARE_PROVIDER_SITE_OTHER): Payer: Self-pay

## 2018-06-12 ENCOUNTER — Ambulatory Visit: Payer: Managed Care, Other (non HMO) | Admitting: Nurse Practitioner

## 2018-06-12 VITALS — BP 124/70 | HR 90 | Resp 16 | Ht 59.0 in | Wt 138.0 lb

## 2018-06-12 DIAGNOSIS — Z1231 Encounter for screening mammogram for malignant neoplasm of breast: Secondary | ICD-10-CM

## 2018-06-12 DIAGNOSIS — R0602 Shortness of breath: Secondary | ICD-10-CM | POA: Diagnosis not present

## 2018-06-12 DIAGNOSIS — D860 Sarcoidosis of lung: Secondary | ICD-10-CM

## 2018-06-12 DIAGNOSIS — Z23 Encounter for immunization: Secondary | ICD-10-CM

## 2018-06-12 DIAGNOSIS — Z1239 Encounter for other screening for malignant neoplasm of breast: Secondary | ICD-10-CM

## 2018-06-12 MED ORDER — PNEUMOCOCCAL VAC POLYVALENT 25 MCG/0.5ML IJ INJ
INJECTION | INTRAMUSCULAR | 0 refills | Status: DC
Start: 2018-06-12 — End: 2019-08-01

## 2018-06-12 MED ORDER — TRELEGY ELLIPTA 100-62.5-25 MCG/INH IN AEPB: 1.0000 | INHALATION_SPRAY | Freq: Every day | RESPIRATORY_TRACT | 5 refills | Status: DC

## 2018-06-12 MED ORDER — ALBUTEROL SULFATE HFA 108 (90 BASE) MCG/ACT IN AERS: 2.0000 | INHALATION_SPRAY | RESPIRATORY_TRACT | 5 refills | Status: DC | PRN

## 2018-06-12 MED ORDER — ALBUTEROL SULFATE (2.5 MG/3ML) 0.083% IN NEBU
2.5000 mg | INHALATION_SOLUTION | Freq: Four times a day (QID) | RESPIRATORY_TRACT | 11 refills | Status: AC | PRN
Start: 2018-06-12 — End: ?

## 2018-06-12 NOTE — Progress Notes (Signed)
[facility] [address] [place], [place] [id number]  Internal MEDICINE  Office Visit Note  Patient Name: Marissa MiyamotoKatie S Luna  [id number]  [id number]  Date of Service: 07/11/2018   Pt is here for routine follow up.   Chief Complaint  Patient presents with  . Shortness of Breath    Continues to be out of work due to intermittent use of oxygen. Does not require use as often as before, but need may come up unexpectedly. Employer will not allow her to come back to work until oxygen is no longer needed. Today, she has no no physical concerns or complaints.   Pneumonia  She complains of shortness of breath. There is no cough, difficulty breathing or wheezing. Primary symptoms comments: Intermittent wheezing and shortness of breath. . This is a recurrent problem. The current episode started more than 1 month ago. The problem occurs intermittently. The problem has been gradually improving. The cough is non-productive. Associated symptoms include dyspnea on exertion, headaches, malaise/fatigue and orthopnea. Pertinent negatives include no chest pain, myalgias, postnasal drip, rhinorrhea or sore throat. Her symptoms are aggravated by minimal activity, strenuous activity and emotional stress. Her symptoms are alleviated by rest, steroid inhaler, oral steroids and beta-agonist (oxygen and antibiotics ). She reports moderate improvement on treatment. 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 Her past medical history is significant for asthma and COPD. Past medical history comments: Sarcoid of lung.       Current Medication: Outpatient Encounter Medications as of 06/12/2018  Medication Sig  . albuterol (PROAIR HFA) 108 (90 Base) MCG/ACT inhaler Inhale 2 puffs into the lungs every 4 (four) hours as needed for wheezing or shortness of breath.  . ALPRAZolam (XANAX) 0.25 MG tablet Take 1 tablet (0.25 mg total) by mouth 2 (two) times daily as needed for anxiety.  Marland Kitchen. amLODipine (NORVASC) 5 MG tablet Take 5 mg  by mouth daily.  . Calcium Carbonate-Vitamin D 600-400 MG-UNIT tablet Take by mouth.  . cetirizine (ZYRTEC) 10 MG tablet Take 10 mg by mouth.  . fluticasone (FLONASE) 50 MCG/ACT nasal spray Place into the nose.  . folic acid (FOLVITE) 1 MG tablet Take 1 mg by mouth.  Marland Kitchen. ibuprofen (ADVIL,MOTRIN) 800 MG tablet Take 800 mg by mouth.  . methotrexate (RHEUMATREX) 2.5 MG tablet Take by mouth.  . pneumococcal 13-valent conjugate vaccine (PREVNAR 13) SUSP injection Inject 0.5 mLs into the muscle tomorrow at 10 am.  . Polyethylene Glycol 3350 (PEG 3350) POWD Take as directed for colonoscopy prep.  . predniSONE (DELTASONE) 5 MG tablet Take 1 tablet (5 mg total) by mouth daily with breakfast.  . promethazine-codeine (PHENERGAN WITH CODEINE) 6.25-10 MG/5ML syrup Take 5 mLs by mouth every 8 (eight) hours as needed for cough.  Marland Kitchen. rOPINIRole (REQUIP) 0.25 MG tablet nightly as needed.  . TRELEGY ELLIPTA 100-62.5-25 MCG/INH AEPB Inhale 1 puff into the lungs daily.  Marland Kitchen. triamcinolone ointment (KENALOG) 0.1 % Apply twice daily as needed to affected areas on body and face  . Vitamin D, Ergocalciferol, (DRISDOL) 50000 units CAPS capsule Take 1 capsule (50,000 Units total) by mouth once a week.  . [DISCONTINUED] albuterol (PROAIR HFA) 108 (90 Base) MCG/ACT inhaler INHALE 2 PUFFS 4 TIMES A DAY AS NEEDED FOR WHEEZING  . [DISCONTINUED] ALBUTEROL SULFATE IN   . [DISCONTINUED] amLODipine (NORVASC) 10 MG tablet Take 10 mg by mouth daily.  . [DISCONTINUED] amoxicillin (AMOXIL) 875 MG tablet Take 1 tablet (875 mg total) by mouth 2 (two) times daily.  . [DISCONTINUED]  azithromycin (ZITHROMAX) 250 MG tablet z-pack - take as directed for 5 days  . [DISCONTINUED] cefUROXime (CEFTIN) 500 MG tablet Take 1 tablet (500 mg total) by mouth 2 (two) times daily with a meal.  . [DISCONTINUED] cephALEXin (KEFLEX) 500 MG capsule   . [DISCONTINUED] fluticasone furoate-vilanterol (BREO ELLIPTA) 100-25 MCG/INH AEPB   . [DISCONTINUED]  levofloxacin (LEVAQUIN) 500 MG tablet Take 1 tablet (500 mg total) by mouth daily.  . [DISCONTINUED] predniSONE (STERAPRED UNI-PAK 21 TAB) 10 MG (21) TBPK tablet 6 day taper - take by mouth as directed for 6 days  . [DISCONTINUED] tiotropium (SPIRIVA) 18 MCG inhalation capsule Place 18 mcg into inhaler and inhale daily.  . [DISCONTINUED] TRELEGY ELLIPTA 100-62.5-25 MCG/INH AEPB   . albuterol (PROVENTIL) (2.5 MG/3ML) 0.083% nebulizer solution Take 3 mLs (2.5 mg total) by nebulization every 6 (six) hours as needed.  . pneumococcal 23 valent vaccine (PNEUMOVAX 23) 25 MCG/0.5ML injection Inject 0.59ml IM once   No facility-administered encounter medications on file as of 06/12/2018.     Surgical History: Past Surgical History:  Procedure Laterality Date  . CHOLECYSTECTOMY    . COLONOSCOPY N/A 10/26/2015   Procedure: COLONOSCOPY;  Surgeon: Wallace Cullens, MD;  Location: [facility] ENDOSCOPY;  Service: Gastroenterology;  Laterality: N/A;    Medical History: Past Medical History:  Diagnosis Date  . Asthma   . Environmental allergies   . Hypertension   . Sarcoidosis     Family History: Family History  Problem Relation Age of Onset  . Lung cancer Mother   . Colon cancer Father   . Diabetes Sister     Social History   Socioeconomic History  . Marital status: Married    Spouse name: Not on file  . Number of children: Not on file  . Years of education: Not on file  . Highest education level: Not on file  Occupational History  . Not on file  Social Needs  . Financial resource strain: Not on file  . Food insecurity:    Worry: Not on file    Inability: Not on file  . Transportation needs:    Medical: Not on file    Non-medical: Not on file  Tobacco Use  . Smoking status: Never Smoker  . Smokeless tobacco: Never Used  Substance and Sexual Activity  . Alcohol use: No  . Drug use: No  . Sexual activity: Not on file  Lifestyle  . Physical activity:    Days per week: Not on file     Minutes per session: Not on file  . Stress: Not on file  Relationships  . Social connections:    Talks on phone: Not on file    Gets together: Not on file    Attends religious service: Not on file    Active member of club or organization: Not on file    Attends meetings of clubs or organizations: Not on file    Relationship status: Not on file  . Intimate partner violence:    Fear of current or ex partner: Not on file    Emotionally abused: Not on file    Physically abused: Not on file    Forced sexual activity: Not on file  Other Topics Concern  . Not on file  Social History Narrative  . Not on file      Review of Systems  Constitutional: Positive for fatigue and malaise/fatigue. Negative for activity change.  HENT: Negative for congestion, postnasal drip, rhinorrhea, sinus pressure, sinus pain, sore  throat and voice change.   Eyes: Negative.   Respiratory: Positive for shortness of breath. Negative for cough and wheezing.   Cardiovascular: Positive for dyspnea on exertion. Negative for chest pain and palpitations.  Gastrointestinal: Negative for constipation, diarrhea, nausea and vomiting.  Endocrine: Negative for cold intolerance, heat intolerance, polydipsia, polyphagia and polyuria.  Genitourinary: Negative.   Musculoskeletal: Negative for arthralgias, back pain and myalgias.  Skin: Negative for rash.  Allergic/Immunologic: Positive for environmental allergies.  Neurological: Positive for headaches. Negative for weakness.  Hematological: Negative for adenopathy.  Psychiatric/Behavioral: Negative for agitation and behavioral problems. The patient is nervous/anxious.     Today's Vitals   06/12/18 1522  BP: 124/70  Pulse: 90  Resp: 16  SpO2: 95%  Weight: 138 lb (62.6 kg)  Height: 4\' 11"  (1.499 m)    Physical Exam  Constitutional: She is oriented to person, place, and time. She appears well-developed and well-nourished. She does not appear ill. No distress. Nasal  cannula in place.  HENT:  Head: Normocephalic and atraumatic.  Right Ear: Tympanic membrane is not erythematous and not bulging.  Left Ear: Tympanic membrane is not erythematous and not bulging.  Nose: No rhinorrhea. Right sinus exhibits no maxillary sinus tenderness and no frontal sinus tenderness. Left sinus exhibits no maxillary sinus tenderness and no frontal sinus tenderness.  Mouth/Throat: No oropharyngeal exudate or posterior oropharyngeal erythema.  Eyes: Pupils are equal, round, and reactive to light. EOM are normal.  Neck: Normal range of motion. Neck supple. No JVD present. No tracheal deviation present. No thyromegaly present.  Cardiovascular: Normal rate and regular rhythm. Exam reveals no gallop and no friction rub.  Murmur heard. Mild tachycardia  Pulmonary/Chest: Effort normal and breath sounds normal. No respiratory distress. She has no wheezes. She has no rales. She exhibits no tenderness.  Some expiratory congestion in left lower lebe of the lung. Breath sounds are otherwise clear and equal throughout lung fields.   Abdominal: Soft. Bowel sounds are normal. There is no tenderness.  Musculoskeletal: Normal range of motion.  Lymphadenopathy:    She has no cervical adenopathy.  Neurological: She is alert and oriented to person, place, and time. No cranial nerve deficit.  Skin: Skin is warm and dry. She is not diaphoretic.  Psychiatric: She has a normal mood and affect. Her behavior is normal. Judgment and thought content normal.  Nursing note and vitals reviewed.  Assessment/Plan: 1. Shortness of breath Continue to use inhalers as prescribed. Oxygen as needed and as prescribed.   2. Sarcoidosis of lung (HCC) - albuterol (PROAIR HFA) 108 (90 Base) MCG/ACT inhaler; Inhale 2 puffs into the lungs every 4 (four) hours as needed for wheezing or shortness of breath.  Dispense: 1 Inhaler; Refill: 5 - albuterol (PROVENTIL) (2.5 MG/3ML) 0.083% nebulizer solution; Take 3 mLs (2.5 mg  total) by nebulization every 6 (six) hours as needed.  Dispense: 120 mL; Refill: 11 - TRELEGY ELLIPTA 100-62.5-25 MCG/INH AEPB; Inhale 1 puff into the lungs daily.  Dispense: 1 each; Refill: 5  3. Screening for breast cancer - MM 3D SCREEN BREAST BILATERAL; Future  4. Need for prophylactic vaccination against Streptococcus pneumoniae (pneumococcus) - pneumococcal 23 valent vaccine (PNEUMOVAX 23) 25 MCG/0.5ML injection; Inject 0.31ml IM once  Dispense: 0.5 mL; Refill: 0  General Counseling: Marsheila verbalizes understanding of the findings of todays visit and agrees with plan of treatment. I have discussed any further diagnostic evaluation that may be needed or ordered today. We also reviewed her medications today. she has  been encouraged to call the office with any questions or concerns that should arise related to todays visit.    Counseling:  This patient was seen by Vincent Gros FNP Collaboration with Dr Lyndon Code as a part of collaborative care agreement  Orders Placed This Encounter  Procedures  . MM 3D SCREEN BREAST BILATERAL    Meds ordered this encounter  Medications  . albuterol (PROAIR HFA) 108 (90 Base) MCG/ACT inhaler    Sig: Inhale 2 puffs into the lungs every 4 (four) hours as needed for wheezing or shortness of breath.    Dispense:  1 Inhaler    Refill:  5    Order Specific Question:   Supervising Provider    Answer:   Lyndon Code [[id number]]  . albuterol (PROVENTIL) (2.5 MG/3ML) 0.083% nebulizer solution    Sig: Take 3 mLs (2.5 mg total) by nebulization every 6 (six) hours as needed.    Dispense:  120 mL    Refill:  11    Order Specific Question:   Supervising Provider    Answer:   Lyndon Code [[id number]]  . pneumococcal 23 valent vaccine (PNEUMOVAX 23) 25 MCG/0.5ML injection    Sig: Inject 0.17ml IM once    Dispense:  0.5 mL    Refill:  0    Order Specific Question:   Supervising Provider    Answer:   Lyndon Code [[id number]]  . TRELEGY ELLIPTA 100-62.5-25 MCG/INH  AEPB    Sig: Inhale 1 puff into the lungs daily.    Dispense:  1 each    Refill:  5    Order Specific Question:   Supervising Provider    Answer:   Lyndon Code [[id number]]    Time spent: 107 Minutes      Dr Lyndon Code Internal medicine

## 2018-06-15 ENCOUNTER — Ambulatory Visit
Admission: RE | Admit: 2018-06-15 | Discharge: 2018-06-15 | Disposition: A | Discharge status: Home / Self Care | Payer: Managed Care, Other (non HMO) | Source: Ambulatory Visit | Attending: Nurse Practitioner | Admitting: Nurse Practitioner

## 2018-06-15 DIAGNOSIS — Z1231 Encounter for screening mammogram for malignant neoplasm of breast: Secondary | ICD-10-CM | POA: Diagnosis not present

## 2018-06-15 DIAGNOSIS — Z1239 Encounter for other screening for malignant neoplasm of breast: Secondary | ICD-10-CM

## 2018-07-03 ENCOUNTER — Other Ambulatory Visit: Payer: Self-pay

## 2018-07-03 MED ORDER — AMLODIPINE BESYLATE 10 MG PO TABS: 10.0000 mg | ORAL_TABLET | Freq: Every day | ORAL | 3 refills | Status: DC

## 2018-07-11 ENCOUNTER — Other Ambulatory Visit: Payer: Self-pay

## 2018-07-11 ENCOUNTER — Encounter: Payer: Self-pay | Admitting: Nurse Practitioner

## 2018-07-11 ENCOUNTER — Telehealth: Payer: Self-pay

## 2018-07-11 DIAGNOSIS — Z23 Encounter for immunization: Secondary | ICD-10-CM | POA: Insufficient documentation

## 2018-07-11 NOTE — Telephone Encounter (Signed)
pharmist called like to know which pneumonia shot she need as per heather gave verbal order prevnar 13

## 2018-07-17 ENCOUNTER — Ambulatory Visit: Payer: Managed Care, Other (non HMO) | Admitting: Internal Medicine

## 2018-07-17 ENCOUNTER — Encounter: Payer: Self-pay | Admitting: Internal Medicine

## 2018-07-17 VITALS — BP 140/80 | HR 77 | Resp 16 | Ht 59.0 in | Wt 139.0 lb

## 2018-07-17 DIAGNOSIS — R0602 Shortness of breath: Secondary | ICD-10-CM

## 2018-07-17 DIAGNOSIS — Z9981 Dependence on supplemental oxygen: Secondary | ICD-10-CM

## 2018-07-17 DIAGNOSIS — D860 Sarcoidosis of lung: Secondary | ICD-10-CM

## 2018-07-17 NOTE — Progress Notes (Signed)
[facility] [address] [place], [place] [address]  Pulmonary Sleep Medicine   Office Visit Note  Patient Name: Marissa MiyamotoKatie S Luna DOB: 08/20/1956 MRN [id number]  Date of Service: 07/17/2018  Complaints/HPI: Pt here for follow up for Sarcoidosis, she is oxygen dependant at night.  She has historically had desaturations with any activities. Her job is requesting a letter for her to return to work without oxygen.  Pt reports using oxygen mostly during the night, but occasionally needs it during the day with activities. She is otherwise doing well.  No hospital admissions.      ROS  General: (-) fever, (-) chills, (-) night sweats, (-) weakness Skin: (-) rashes, (-) itching,. Eyes: (-) visual changes, (-) redness, (-) itching. Nose and Sinuses: (-) nasal stuffiness or itchiness, (-) postnasal drip, (-) nosebleeds, (-) sinus trouble. Mouth and Throat: (-) sore throat, (-) hoarseness. Neck: (-) swollen glands, (-) enlarged thyroid, (-) neck pain. Respiratory: + cough, (-) bloody sputum, + shortness of breath, + wheezing. Cardiovascular: - ankle swelling, (-) chest pain. Lymphatic: (-) lymph node enlargement. Neurologic: (-) numbness, (-) tingling. Psychiatric: (-) anxiety, (-) depression   Current Medication: Outpatient Encounter Medications as of 07/17/2018  Medication Sig  . albuterol (PROAIR HFA) 108 (90 Base) MCG/ACT inhaler Inhale 2 puffs into the lungs every 4 (four) hours as needed for wheezing or shortness of breath.  Marland Kitchen. albuterol (PROVENTIL) (2.5 MG/3ML) 0.083% nebulizer solution Take 3 mLs (2.5 mg total) by nebulization every 6 (six) hours as needed.  . ALPRAZolam (XANAX) 0.25 MG tablet Take 1 tablet (0.25 mg total) by mouth 2 (two) times daily as needed for anxiety.  Marland Kitchen. amLODipine (NORVASC) 10 MG tablet Take 1 tablet (10 mg total) by mouth daily.  Marland Kitchen. amLODipine (NORVASC) 5 MG tablet Take 5 mg by mouth daily.  . Calcium Carbonate-Vitamin D 600-400 MG-UNIT tablet Take by  mouth.  . cetirizine (ZYRTEC) 10 MG tablet Take 10 mg by mouth.  . fluticasone (FLONASE) 50 MCG/ACT nasal spray Place into the nose.  . folic acid (FOLVITE) 1 MG tablet Take 1 mg by mouth.  Marland Kitchen. ibuprofen (ADVIL,MOTRIN) 800 MG tablet Take 800 mg by mouth.  . methotrexate (RHEUMATREX) 2.5 MG tablet Take by mouth.  . pneumococcal 13-valent conjugate vaccine (PREVNAR 13) SUSP injection Inject 0.5 mLs into the muscle tomorrow at 10 am.  . pneumococcal 23 valent vaccine (PNEUMOVAX 23) 25 MCG/0.5ML injection Inject 0.635ml IM once  . Polyethylene Glycol 3350 (PEG 3350) POWD Take as directed for colonoscopy prep.  . predniSONE (DELTASONE) 5 MG tablet Take 1 tablet (5 mg total) by mouth daily with breakfast.  . promethazine-codeine (PHENERGAN WITH CODEINE) 6.25-10 MG/5ML syrup Take 5 mLs by mouth every 8 (eight) hours as needed for cough.  Marland Kitchen. rOPINIRole (REQUIP) 0.25 MG tablet nightly as needed.  . TRELEGY ELLIPTA 100-62.5-25 MCG/INH AEPB Inhale 1 puff into the lungs daily.  Marland Kitchen. triamcinolone ointment (KENALOG) 0.1 % Apply twice daily as needed to affected areas on body and face  . Vitamin D, Ergocalciferol, (DRISDOL) 50000 units CAPS capsule Take 1 capsule (50,000 Units total) by mouth once a week.   No facility-administered encounter medications on file as of 07/17/2018.     Surgical History: Past Surgical History:  Procedure Laterality Date  . CHOLECYSTECTOMY    . COLONOSCOPY N/A 10/26/2015   Procedure: COLONOSCOPY;  Surgeon: Wallace CullensPaul Y Oh, MD;  [facility];  Service: Gastroenterology;  Laterality: N/A;    Medical History: Past Medical History:  Diagnosis Date  . Asthma   . Environmental allergies   . Hypertension   . Sarcoidosis     Family History: Family History  Problem Relation Age of Onset  . Lung cancer Mother   . Colon cancer Father   . Diabetes Sister     Social History: Social History   Socioeconomic History  . Marital status: Married    Spouse name: Not on file   . Number of children: Not on file  . Years of education: Not on file  . Highest education level: Not on file  Occupational History  . Not on file  Social Needs  . Financial resource strain: Not on file  . Food insecurity:    Worry: Not on file    Inability: Not on file  . Transportation needs:    Medical: Not on file    Non-medical: Not on file  Tobacco Use  . Smoking status: Never Smoker  . Smokeless tobacco: Never Used  Substance and Sexual Activity  . Alcohol use: No  . Drug use: No  . Sexual activity: Not on file  Lifestyle  . Physical activity:    Days per week: Not on file    Minutes per session: Not on file  . Stress: Not on file  Relationships  . Social connections:    Talks on phone: Not on file    Gets together: Not on file    Attends religious service: Not on file    Active member of club or organization: Not on file    Attends meetings of clubs or organizations: Not on file    Relationship status: Not on file  . Intimate partner violence:    Fear of current or ex partner: Not on file    Emotionally abused: Not on file    Physically abused: Not on file    Forced sexual activity: Not on file  Other Topics Concern  . Not on file  Social History Narrative  . Not on file    Vital Signs: Blood pressure 140/80, pulse 77, resp. rate 16, height 4\' 11"  (1.499 m), weight 139 lb (63 kg), SpO2 95 %.  Examination: General Appearance: The patient is well-developed, well-nourished, and in no distress. Skin: Gross inspection of skin unremarkable. Head: normocephalic, no gross deformities. Eyes: no gross deformities noted. ENT: ears appear grossly normal no exudates. Neck: Supple. No thyromegaly. No LAD. Respiratory: no rhonchi noted. Cardiovascular: Normal S1 and S2 without murmur or rub. Extremities: No cyanosis. pulses are equal. Neurologic: Alert and oriented. No involuntary movements.  LABS: No results found for this or any previous visit (from the past  2160 hour(s)).  Radiology: Mm 3d Screen Breast Bilateral  Result Date: 06/15/2018 CLINICAL DATA:  Screening. EXAM: DIGITAL SCREENING BILATERAL MAMMOGRAM WITH TOMO AND CAD COMPARISON:  Previous exam(s). ACR Breast Density Category b: There are scattered areas of fibroglandular density. FINDINGS: There are no findings suspicious for malignancy. Images were processed with CAD. IMPRESSION: No mammographic evidence of malignancy. A result letter of this screening mammogram will be mailed directly to the patient. RECOMMENDATION: Screening mammogram in one year. (Code:SM-B-01Y) BI-RADS CATEGORY  1: Negative. Electronically Signed   By: Baird Lyons M.D.   On: 06/15/2018 10:56    No results found.  No results found.    Assessment and Plan: Patient Active Problem List   Diagnosis Date Noted  . Need for prophylactic vaccination against Streptococcus pneumoniae (pneumococcus) 07/11/2018  . Screening for breast cancer 03/25/2018  . Dysuria  03/25/2018  . Chronic respiratory failure with hypoxia (HCC) 03/13/2018  . Acute bronchitis with COPD (HCC) 02/22/2018  . Lymphocytosis 02/22/2018  . Abnormal kidney function 02/22/2018  . Sepsis (HCC) 01/11/2018  . Community acquired pneumonia of left lung 01/11/2018  . Influenza A 01/11/2018  . 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 Hypoxemia 01/08/2018  . Allergic rhinitis, unspecified 01/08/2018  . Essential (primary) hypertension 01/08/2018  . 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 Shortness of breath 01/08/2018    1. Sarcoidosis of lung (HCC) Advanced disease, oxygen dependent. She will need to continue to use oxygen at night and intermittently through out the day.  It is not recommended that she is without oxygen while ambulating.  If her job can accomodate her so that she does not have to walk, and can be more sedentary then she may go without oxygen. Also, if her job will let her have a mini concentrator, which we can explore if they are accommodating.   RX for [employer] portable oxygen Concentrator.    2. SOB (shortness of breath) - Spirometry with Graph  3. Oxygen dependent Pt using oxygen mostly at night.    General Counseling: I have discussed the findings of the evaluation and examination with Andrina.  I have also discussed any further diagnostic evaluation thatmay be needed or ordered today. Alayna verbalizes understanding of the findings of todays visit. We also reviewed her medications today and discussed drug interactions and side effects including but not limited excessive drowsiness and altered mental states. We also discussed that there is always a risk not just to her but also people around her. she has been encouraged to call the office with any questions or concerns that should arise related to todays visit.    Time spent: 25 This patient was seen by Blima LedgerAdam Shelley Cocke AGNP-C in Collaboration with Dr. Freda MunroSaadat Khan as a part of collaborative care agreement.  I have personally obtained a history, examined the patient, evaluated laboratory and imaging results, formulated the assessment and plan and placed orders.    Yevonne PaxSaadat A Khan, MD [facility] Pulmonary and [facility] Sleep medicine

## 2018-07-19 ENCOUNTER — Telehealth: Payer: Self-pay

## 2018-07-19 NOTE — Telephone Encounter (Signed)
Will order portable oxygen concentrator when provider, employee, and patient are ready

## 2018-07-23 ENCOUNTER — Other Ambulatory Visit: Payer: Self-pay

## 2018-07-23 MED ORDER — PREDNISONE 5 MG PO TABS: 5.0000 mg | ORAL_TABLET | Freq: Every day | ORAL | 1 refills | Status: DC

## 2018-07-31 ENCOUNTER — Ambulatory Visit: Payer: Self-pay | Admitting: Internal Medicine

## 2018-08-13 ENCOUNTER — Telehealth: Payer: Self-pay

## 2018-08-13 ENCOUNTER — Encounter: Payer: Self-pay | Admitting: Adult Health

## 2018-08-13 DIAGNOSIS — D869 Sarcoidosis, unspecified: Secondary | ICD-10-CM | POA: Insufficient documentation

## 2018-08-13 NOTE — Telephone Encounter (Signed)
Patient advised that her disability paperwork is ready for pickup.Tat

## 2018-09-26 ENCOUNTER — Other Ambulatory Visit: Payer: Self-pay | Admitting: Internal Medicine

## 2018-10-15 ENCOUNTER — Ambulatory Visit: Payer: BLUE CROSS/BLUE SHIELD | Admitting: Nurse Practitioner

## 2018-10-15 ENCOUNTER — Encounter: Payer: Self-pay | Admitting: Nurse Practitioner

## 2018-10-15 VITALS — BP 136/66 | HR 88 | Resp 16 | Ht 60.0 in | Wt 147.2 lb

## 2018-10-15 DIAGNOSIS — Z9981 Dependence on supplemental oxygen: Secondary | ICD-10-CM | POA: Diagnosis not present

## 2018-10-15 DIAGNOSIS — R635 Abnormal weight gain: Secondary | ICD-10-CM

## 2018-10-15 DIAGNOSIS — I10 Essential (primary) hypertension: Secondary | ICD-10-CM | POA: Diagnosis not present

## 2018-10-15 DIAGNOSIS — D860 Sarcoidosis of lung: Secondary | ICD-10-CM | POA: Diagnosis not present

## 2018-10-15 DIAGNOSIS — R5383 Other fatigue: Secondary | ICD-10-CM

## 2018-10-15 NOTE — Progress Notes (Signed)
[facility] [address] [place], [place] [address]  Internal MEDICINE  Office Visit Note  Patient Name: Marissa Luna  [id number]  [id number]  Date of Service: 10/17/2018  Chief Complaint  Patient presents with  . Medical Management of Chronic Issues    90month follow up  . Hypertension  . Asthma  . Depression    pt has been eating alot since her last visit due to depression. pt stated that she has been depressed since she lsot her son    The patient is here for routine follow up exam. She has been out of work since her hospitalization in 01/2018 for pneumonia. She was approved for disability. Her shortness fo breath has improved, however, she still needs to use her oxygen when she is exerting herself. She does take it with her wherever she goes in case of need.  She feels as though she might be depressed. Eating out of sadness/stress. She has gained about 10 pounds since her lat routine visit. Knows she has been making poor decisions regarding her diet and is not exercising and getting out of the house like she should. We did discuss starting low-dose antidepressant therapy, but have agreed that she should try some lifestyle changes prior to adding medications. Lifestyle changes include making better diet choices, taking exercise classes  Focused on low-impact activity. She also plans to joining a social club and become more active in the community.       Current Medication: Outpatient Encounter Medications as of 10/15/2018  Medication Sig  . albuterol (PROAIR HFA) 108 (90 Base) MCG/ACT inhaler Inhale 2 puffs into the lungs every 4 (four) hours as needed for wheezing or shortness of breath.  Marland Kitchen albuterol (PROVENTIL HFA;VENTOLIN HFA) 108 (90 Base) MCG/ACT inhaler INHALE 2 PUFFS 4 TIMES A DAY AS NEEDED FOR WHEEZING  . albuterol (PROVENTIL) (2.5 MG/3ML) 0.083% nebulizer solution Take 3 mLs (2.5 mg total) by nebulization every 6 (six) hours as needed.  . ALPRAZolam (XANAX)  0.25 MG tablet Take 1 tablet (0.25 mg total) by mouth 2 (two) times daily as needed for anxiety.  Marland Kitchen amLODipine (NORVASC) 10 MG tablet Take 1 tablet (10 mg total) by mouth daily.  Marland Kitchen amLODipine (NORVASC) 5 MG tablet Take 5 mg by mouth daily.  . Calcium Carbonate-Vitamin D 600-400 MG-UNIT tablet Take by mouth.  . cetirizine (ZYRTEC) 10 MG tablet Take 10 mg by mouth.  . fluticasone (FLONASE) 50 MCG/ACT nasal spray Place into the nose.  . folic acid (FOLVITE) 1 MG tablet Take 1 mg by mouth.  Marland Kitchen ibuprofen (ADVIL,MOTRIN) 800 MG tablet Take 800 mg by mouth.  . methotrexate (RHEUMATREX) 2.5 MG tablet Take by mouth.  . Polyethylene Glycol 3350 (PEG 3350) POWD Take as directed for colonoscopy prep.  . predniSONE (DELTASONE) 5 MG tablet Take 1 tablet (5 mg total) by mouth daily with breakfast.  . promethazine-codeine (PHENERGAN WITH CODEINE) 6.25-10 MG/5ML syrup Take 5 mLs by mouth every 8 (eight) hours as needed for cough.  Marland Kitchen rOPINIRole (REQUIP) 0.25 MG tablet nightly as needed.  . TRELEGY ELLIPTA 100-62.5-25 MCG/INH AEPB Inhale 1 puff into the lungs daily.  Marland Kitchen triamcinolone ointment (KENALOG) 0.1 % Apply twice daily as needed to affected areas on body and face  . Vitamin D, Ergocalciferol, (DRISDOL) 50000 units CAPS capsule Take 1 capsule (50,000 Units total) by mouth once a week.  . pneumococcal 13-valent conjugate vaccine (PREVNAR 13) SUSP injection Inject 0.5 mLs into the muscle tomorrow at 10 am.  .  pneumococcal 23 valent vaccine (PNEUMOVAX 23) 25 MCG/0.5ML injection Inject 0.81ml IM once (Patient not taking: Reported on 10/15/2018)   No facility-administered encounter medications on file as of 10/15/2018.     Surgical History: Past Surgical History:  Procedure Laterality Date  . CHOLECYSTECTOMY    . COLONOSCOPY N/A 10/26/2015   Procedure: COLONOSCOPY;  Surgeon: Wallace Cullens, MD;  Location: [facility] ENDOSCOPY;  Service: Gastroenterology;  Laterality: N/A;    Medical History: Past Medical History:   Diagnosis Date  . Asthma   . Environmental allergies   . Hypertension   . Sarcoidosis     Family History: Family History  Problem Relation Age of Onset  . Lung cancer Mother   . Colon cancer Father   . Diabetes Sister     Social History   Socioeconomic History  . Marital status: Married    Spouse name: Not on file  . Number of children: Not on file  . Years of education: Not on file  . Highest education level: Not on file  Occupational History  . Not on file  Social Needs  . Financial resource strain: Not on file  . Food insecurity:    Worry: Not on file    Inability: Not on file  . Transportation needs:    Medical: Not on file    Non-medical: Not on file  Tobacco Use  . Smoking status: Never Smoker  . Smokeless tobacco: Never Used  Substance and Sexual Activity  . Alcohol use: No  . Drug use: No  . Sexual activity: Not on file  Lifestyle  . Physical activity:    Days per week: Not on file    Minutes per session: Not on file  . Stress: Not on file  Relationships  . Social connections:    Talks on phone: Not on file    Gets together: Not on file    Attends religious service: Not on file    Active member of club or organization: Not on file    Attends meetings of clubs or organizations: Not on file    Relationship status: Not on file  . Intimate partner violence:    Fear of current or ex partner: Not on file    Emotionally abused: Not on file    Physically abused: Not on file    Forced sexual activity: Not on file  Other Topics Concern  . Not on file  Social History Narrative  . Not on file      Review of Systems  Constitutional: Positive for activity change and unexpected weight change. Negative for chills and fatigue.  HENT: Negative for congestion, postnasal drip, rhinorrhea, sneezing and sore throat.   Respiratory: Positive for shortness of breath. Negative for cough and chest tightness.   Cardiovascular: Negative for chest pain and  palpitations.  Gastrointestinal: Negative for abdominal pain, constipation, diarrhea, nausea and vomiting.  Endocrine: Negative.   Musculoskeletal: Negative for arthralgias, back pain, joint swelling and neck pain.  Skin: Negative for rash.  Allergic/Immunologic: Positive for environmental allergies.  Neurological: Negative for dizziness, tremors, numbness and headaches.  Hematological: Negative for adenopathy. Does not bruise/bleed easily.  Psychiatric/Behavioral: Positive for dysphoric mood. Negative for behavioral problems (Depression), sleep disturbance and suicidal ideas. The patient is nervous/anxious.     Today's Vitals   10/15/18 1549  BP: 136/66  Pulse: 88  Resp: 16  SpO2: 93%  Weight: 147 lb 3.2 oz (66.8 kg)  Height: 5' (1.524 m)    Physical Exam  Constitutional: She is oriented to person, place, and time. She appears well-developed and well-nourished. She does not appear ill. No distress. Nasal cannula in place.  HENT:  Head: Normocephalic and atraumatic.  Nose: No rhinorrhea.  Eyes: Pupils are equal, round, and reactive to light. EOM are normal.  Neck: Normal range of motion. Neck supple. No JVD present. No tracheal deviation present. No thyromegaly present.  Cardiovascular: Normal rate and regular rhythm. Exam reveals no gallop and no friction rub.  Murmur heard. Pulmonary/Chest: Effort normal and breath sounds normal. No respiratory distress. She has no wheezes. She has no rales. She exhibits no tenderness.  Lungs are clear with mild diminished breath sounds in bilateral lung bases. She continues to use nasal cannula oxygen as needed.   Musculoskeletal: Normal range of motion.  Lymphadenopathy:    She has no cervical adenopathy.  Neurological: She is alert and oriented to person, place, and time. No cranial nerve deficit.  Skin: Skin is warm and dry. She is not diaphoretic.  Psychiatric: Her speech is normal and behavior is normal. Judgment and thought content  normal. Cognition and memory are normal. She exhibits a depressed mood.  Nursing note and vitals reviewed.  Assessment/Plan: 1. Essential hypertension Stable. Continue bp medication as prescribed   2. Sarcoidosis of lung (HCC) Continue respiratory medication and inhalers as prescribed. Continue routine visits with Dr. Freda Munro for management.   3. Oxygen dependent Continue to use nasal cannula oxygen as prescribed.   4. Other fatigue Likely related tomild depressive episode. Will check thyroid panel and anemia panel to evaluate for metabolic causes of fatigue.   5. Abnormal weight gain Likely related to poor diet choices. Discussed diet and lifestyle changes to help with weight management. Will check thyroid panel to evaluate for metabolic causes of abnormal weight gain .  General Counseling: Anais verbalizes understanding of the findings of todays visit and agrees with plan of treatment. I have discussed any further diagnostic evaluation that may be needed or ordered today. We also reviewed her medications today. she has been encouraged to call the office with any questions or concerns that should arise related to todays visit.   Obesity Counseling: Risk Assessment: An assessment of behavioral risk factors was made today and includes lack of exercise sedentary lifestyle, lack of portion control and poor dietary habits.  Risk Modification Advice: She was counseled on portion control guidelines. Restricting daily caloric intake to 1200. The detrimental long term effects of obesity on her health and ongoing poor compliance was also discussed with the patient.  This patient was seen by Vincent Gros FNP Collaboration with Dr Lyndon Code as a part of collaborative care agreement  Time spent: 25 Minutes      Dr Lyndon Code Internal medicine

## 2018-10-17 DIAGNOSIS — R635 Abnormal weight gain: Secondary | ICD-10-CM | POA: Insufficient documentation

## 2018-10-17 DIAGNOSIS — R5383 Other fatigue: Secondary | ICD-10-CM | POA: Insufficient documentation

## 2018-10-17 DIAGNOSIS — Z9981 Dependence on supplemental oxygen: Secondary | ICD-10-CM | POA: Insufficient documentation

## 2018-10-17 DIAGNOSIS — R531 Weakness: Secondary | ICD-10-CM | POA: Insufficient documentation

## 2018-10-23 ENCOUNTER — Ambulatory Visit: Payer: Self-pay | Admitting: Internal Medicine

## 2018-10-30 ENCOUNTER — Encounter: Payer: Self-pay | Admitting: Internal Medicine

## 2018-10-30 ENCOUNTER — Ambulatory Visit: Payer: BLUE CROSS/BLUE SHIELD | Admitting: Internal Medicine

## 2018-10-30 VITALS — BP 133/62 | HR 84 | Resp 16 | Ht 59.0 in | Wt 146.0 lb

## 2018-10-30 DIAGNOSIS — D860 Sarcoidosis of lung: Secondary | ICD-10-CM

## 2018-10-30 DIAGNOSIS — Z9981 Dependence on supplemental oxygen: Secondary | ICD-10-CM | POA: Diagnosis not present

## 2018-10-30 DIAGNOSIS — R0602 Shortness of breath: Secondary | ICD-10-CM | POA: Diagnosis not present

## 2018-10-30 DIAGNOSIS — I10 Essential (primary) hypertension: Secondary | ICD-10-CM | POA: Diagnosis not present

## 2018-10-30 MED ORDER — PREDNISONE 5 MG PO TABS: 5.0000 mg | ORAL_TABLET | Freq: Every day | ORAL | 1 refills | Status: DC

## 2018-10-30 MED ORDER — IBUPROFEN 800 MG PO TABS: 800.0000 mg | ORAL_TABLET | Freq: Three times a day (TID) | ORAL | 1 refills | Status: DC

## 2018-10-30 MED ORDER — TRELEGY ELLIPTA 100-62.5-25 MCG/INH IN AEPB: 1.0000 | INHALATION_SPRAY | Freq: Every day | RESPIRATORY_TRACT | 5 refills | Status: DC

## 2018-10-30 NOTE — Progress Notes (Signed)
[facility] [address] [place], [place] [address]  Pulmonary Sleep Medicine   Office Visit Note  Patient Name: Marissa Luna DOB: [birth year] MRN [id number]  Date of Service: 10/30/2018  Complaints/HPI: Pt is here for follow up on sarcoidosis and chronic respiratory failure with hypoxia.  Patient reports overall she is doing well.  She is requesting refills on some of her medications.  She is currently using the Trelegy inhaler and reports good results with this.  She uses oxygen as needed for hypoxia.  She reports that she becomes dyspneic on exertion at times.  She reports not using her oxygen more than about 1 hour total in a typical day.  She would like a portable oxygen concentrator however her insurance is about to change the beginning of the year and she states she will wait until that time to try to get the portable concentrator again.  ROS  General: (-) fever, (-) chills, (-) night sweats, (-) weakness Skin: (-) rashes, (-) itching,. Eyes: (-) visual changes, (-) redness, (-) itching. Nose and Sinuses: (-) nasal stuffiness or itchiness, (-) postnasal drip, (-) nosebleeds, (-) sinus trouble. Mouth and Throat: (-) sore throat, (-) hoarseness. Neck: (-) swollen glands, (-) enlarged thyroid, (-) neck pain. Respiratory: - cough, (-) bloody sputum, - shortness of breath, - wheezing. Cardiovascular: - ankle swelling, (-) chest pain. Lymphatic: (-) lymph node enlargement. Neurologic: (-) numbness, (-) tingling. Psychiatric: (-) anxiety, (-) depression   Current Medication: Outpatient Encounter Medications as of 10/30/2018  Medication Sig  . albuterol (PROAIR HFA) 108 (90 Base) MCG/ACT inhaler Inhale 2 puffs into the lungs every 4 (four) hours as needed for wheezing or shortness of breath.  Marland Kitchen albuterol (PROVENTIL HFA;VENTOLIN HFA) 108 (90 Base) MCG/ACT inhaler INHALE 2 PUFFS 4 TIMES A DAY AS NEEDED FOR WHEEZING  . albuterol (PROVENTIL) (2.5 MG/3ML) 0.083% nebulizer  solution Take 3 mLs (2.5 mg total) by nebulization every 6 (six) hours as needed.  . ALPRAZolam (XANAX) 0.25 MG tablet Take 1 tablet (0.25 mg total) by mouth 2 (two) times daily as needed for anxiety.  Marland Kitchen amLODipine (NORVASC) 10 MG tablet Take 1 tablet (10 mg total) by mouth daily.  Marland Kitchen amLODipine (NORVASC) 5 MG tablet Take 5 mg by mouth daily.  . Calcium Carbonate-Vitamin D 600-400 MG-UNIT tablet Take by mouth.  . cetirizine (ZYRTEC) 10 MG tablet Take 10 mg by mouth.  . fluticasone (FLONASE) 50 MCG/ACT nasal spray Place into the nose.  . folic acid (FOLVITE) 1 MG tablet Take 1 mg by mouth.  Marland Kitchen ibuprofen (ADVIL,MOTRIN) 800 MG tablet Take 800 mg by mouth.  . methotrexate (RHEUMATREX) 2.5 MG tablet Take by mouth.  . pneumococcal 13-valent conjugate vaccine (PREVNAR 13) SUSP injection Inject 0.5 mLs into the muscle tomorrow at 10 am.  . pneumococcal 23 valent vaccine (PNEUMOVAX 23) 25 MCG/0.5ML injection Inject 0.51ml IM once  . Polyethylene Glycol 3350 (PEG 3350) POWD Take as directed for colonoscopy prep.  . predniSONE (DELTASONE) 5 MG tablet Take 1 tablet (5 mg total) by mouth daily with breakfast.  . promethazine-codeine (PHENERGAN WITH CODEINE) 6.25-10 MG/5ML syrup Take 5 mLs by mouth every 8 (eight) hours as needed for cough.  Marland Kitchen rOPINIRole (REQUIP) 0.25 MG tablet nightly as needed.  . TRELEGY ELLIPTA 100-62.5-25 MCG/INH AEPB Inhale 1 puff into the lungs daily.  Marland Kitchen triamcinolone ointment (KENALOG) 0.1 % Apply twice daily as needed to affected areas on body and face  . Vitamin D, Ergocalciferol, (DRISDOL) 50000 units CAPS capsule  Take 1 capsule (50,000 Units total) by mouth once a week.   No facility-administered encounter medications on file as of 10/30/2018.     Surgical History: Past Surgical History:  Procedure Laterality Date  . CHOLECYSTECTOMY    . COLONOSCOPY N/A 10/26/2015   Procedure: COLONOSCOPY;  Surgeon: Wallace CullensPaul Y Oh, MD;  [facility];  Service: Gastroenterology;   Laterality: N/A;    Medical History: Past Medical History:  Diagnosis Date  . Asthma   . Environmental allergies   . Hypertension   . Sarcoidosis     Family History: Family History  Problem Relation Age of Onset  . Lung cancer Mother   . Colon cancer Father   . Diabetes Sister     Social History: Social History   Socioeconomic History  . Marital status: Married    Spouse name: Not on file  . Number of children: Not on file  . Years of education: Not on file  . Highest education level: Not on file  Occupational History  . Not on file  Social Needs  . Financial resource strain: Not on file  . Food insecurity:    Worry: Not on file    Inability: Not on file  . Transportation needs:    Medical: Not on file    Non-medical: Not on file  Tobacco Use  . Smoking status: Never Smoker  . Smokeless tobacco: Never Used  Substance and Sexual Activity  . Alcohol use: No  . Drug use: No  . Sexual activity: Not on file  Lifestyle  . Physical activity:    Days per week: Not on file    Minutes per session: Not on file  . Stress: Not on file  Relationships  . Social connections:    Talks on phone: Not on file    Gets together: Not on file    Attends religious service: Not on file    Active member of club or organization: Not on file    Attends meetings of clubs or organizations: Not on file    Relationship status: Not on file  . Intimate partner violence:    Fear of current or ex partner: Not on file    Emotionally abused: Not on file    Physically abused: Not on file    Forced sexual activity: Not on file  Other Topics Concern  . Not on file  Social History Narrative  . Not on file    Vital Signs: Blood pressure 133/62, pulse 84, resp. rate 16, height 4\' 11"  (1.499 m), weight 146 lb (66.2 kg), SpO2 94 %.  Examination: General Appearance: The patient is well-developed, well-nourished, and in no distress. Skin: Gross inspection of skin unremarkable. Head:  normocephalic, no gross deformities. Eyes: no gross deformities noted. ENT: ears appear grossly normal no exudates. Neck: Supple. No thyromegaly. No LAD. Respiratory: clear auscultation. Cardiovascular: Normal S1 and S2 without murmur or rub. Extremities: No cyanosis. pulses are equal. Neurologic: Alert and oriented. No involuntary movements.  LABS: No results found for this or any previous visit (from the past 2160 hour(s)).  Radiology: Mm 3d Screen Breast Bilateral  Result Date: 06/15/2018 CLINICAL DATA:  Screening. EXAM: DIGITAL SCREENING BILATERAL MAMMOGRAM WITH TOMO AND CAD COMPARISON:  Previous exam(s). ACR Breast Density Category b: There are scattered areas of fibroglandular density. FINDINGS: There are no findings suspicious for malignancy. Images were processed with CAD. IMPRESSION: No mammographic evidence of malignancy. A result letter of this screening mammogram will be mailed directly to the  patient. RECOMMENDATION: Screening mammogram in one year. (Code:SM-B-01Y) BI-RADS CATEGORY  1: Negative. Electronically Signed   By: Baird Lyons M.D.   On: 06/15/2018 10:56    No results found.  No results found.    Assessment and Plan: Patient Active Problem List   Diagnosis Date Noted  . Oxygen dependent 10/17/2018  . Other fatigue 10/17/2018  . Abnormal weight gain 10/17/2018  . Sarcoidosis   . Need for prophylactic vaccination against Streptococcus pneumoniae (pneumococcus) 07/11/2018  . Screening for breast cancer 03/25/2018  . Dysuria 03/25/2018  . Chronic respiratory failure with hypoxia (HCC) 03/13/2018  . Acute bronchitis with COPD (HCC) 02/22/2018  . Lymphocytosis 02/22/2018  . Abnormal kidney function 02/22/2018  . Sepsis (HCC) 01/11/2018  . Community acquired pneumonia of left lung 01/11/2018  . Influenza A 01/11/2018  . 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 Hypoxemia 01/08/2018  . Allergic rhinitis, unspecified 01/08/2018  . Essential hypertension  01/08/2018  . 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 Shortness of breath 01/08/2018   1. Sarcoidosis of lung [facility]) Patient has advanced disease and is oxygen dependent at night.  She reports her using her oxygen intermittently throughout the day.  We continue to recommend that she use oxygen while up and moving around and ambulating. - TRELEGY ELLIPTA 100-62.5-25 MCG/INH AEPB; Inhale 1 puff into the lungs daily.  Dispense: 1 each; Refill: 5 - predniSONE (DELTASONE) 5 MG tablet; Take 1 tablet (5 mg total) by mouth daily with breakfast.  Dispense: 90 tablet; Refill: 1  2. Oxygen dependent Continue to use oxygen at night and throughout the day as needed.  3. Essential hypertension Stable, continue current medications.  4. SOB (shortness of breath) - Spirometry with Graph   General Counseling: I have discussed the findings of the evaluation and examination with Rayna.  I have also discussed any further diagnostic evaluation thatmay be needed or ordered today. Lakaya verbalizes understanding of the findings of todays visit. We also reviewed her medications today and discussed drug interactions and side effects including but not limited excessive drowsiness and altered mental states. We also discussed that there is always a risk not just to her but also people around her. she has been encouraged to call the office with any questions or concerns that should arise related to todays visit.    Time spent: 25 This patient was seen by Blima Ledger AGNP-C in Collaboration with Dr. Freda Munro as a part of collaborative care agreement.   I have personally obtained a history, examined the patient, evaluated laboratory and imaging results, formulated the assessment and plan and placed orders.    Yevonne Pax, MD [facility] Pulmonary and Critical Care Sleep medicine

## 2018-10-30 NOTE — Patient Instructions (Signed)
Sarcoidosis Sarcoidosis is a disease that causes inflammation in your organs and other areas of your body. The lungs are most often affected (pulmonary sarcoidosis). Sarcoidosis can also affect your lymph nodes, liver, eyes, skin, or any other body tissue. When you have sarcoidosis, small clumps of tissue (granulomas) form in the affected area of your body. Granulomas are made up of your body's defense (immune) cells. Inflammation results when your body reacts to a harmful substance. Normally, inflammation goes away after immune cells get rid of the harmful substance. In sarcoidosis, the immune cells form granulomas instead. What are the causes? The exact cause of sarcoidosis is not known. Something triggers the immune system to respond, such as dust, chemicals, bacteria, or a virus. What increases the risk? You may be at a greater risk for sarcoidosis if you:  Have a family history of the disease.  Are African American.  Are of Northern European ancestry.  Are 20-50 years old.  Are female.  What are the signs or symptoms? Many people with sarcoidosis have no symptoms. Others have very mild symptoms. Sarcoidosis most often affects the lungs. Symptoms include:  Chest pain.  Coughing.  Wheezing.  Shortness of breath.  Other common symptoms include:  Night sweats.  Weight loss.  Fatigue.  Depression.  A sense of uneasiness.  How is this diagnosed? Sarcoidosis may be diagnosed by:  Medical history and physical exam.  Chest X-ray. This looks for granulomas in your lungs.  Lung function tests. These measure your breathing and look for problems related to sarcoidosis.  Examining a sample of tissue under a microscope (biopsy).  How is this treated? Sarcoidosis usually clears up without treatment. You may take medicines to reduce inflammation or relieve symptoms. These may include:  Prednisone. This steroid reduces inflammation related to sarcoidosis.  Chloroquine or  hydroxychloroquine. These are antimalarial medicines used to treat sarcoidosis that affects the skin or brain.  Methotrexate, leflunomide, or azathioprine. These medicines affect the immune system and can help with sarcoidosis in the joints, eyes, skin, or lungs.  Inhalers. Inhaled medicines can help you breathe if sarcoidosis is affecting your lungs.  Follow these instructions at home:  Do not use any tobacco products, including cigarettes, chewing tobacco, or electronic cigarettes. If you need help quitting, ask your health care provider.  Avoid secondhand smoke.  Avoid irritating dust and chemicals. Stay indoors on days when air quality is poor in your area.  Take medicines only as directed by your health care provider. Contact a health care provider if:  You have vision problems.  You have shortness of breath.  You have a dry, persistent cough.  You have an irregular heartbeat.  You have pain or ache in your joints, hands, or feet.  You have an unexplained rash. Get help right away if: You have chest pain. This information is not intended to replace advice given to you by your health care provider. Make sure you discuss any questions you have with your health care provider. Document Released: 09/21/2004 Document Revised: 04/28/2016 Document Reviewed: 03/19/2014 [employer] Interactive Patient Education  2018 [employer]  

## 2018-11-09 ENCOUNTER — Other Ambulatory Visit: Payer: Self-pay | Admitting: Nurse Practitioner

## 2018-11-10 LAB — BASIC METABOLIC PANEL
BUN/Creatinine Ratio: 18 (ref 12–28)
BUN: 18 mg/dL (ref 8–27)
CO2: 26 mmol/L (ref 20–29)
CREATININE: 0.98 mg/dL (ref 0.57–1.00)
Calcium: 9.5 mg/dL (ref 8.7–10.3)
Chloride: 105 mmol/L (ref 96–106)
GFR, EST AFRICAN AMERICAN: 71 mL/min/{1.73_m2} (ref >59)
GFR, EST NON AFRICAN AMERICAN: 62 mL/min/{1.73_m2} (ref >59)
GLUCOSE: 98 mg/dL (ref 65–99)
POTASSIUM: 4.2 mmol/L (ref 3.5–5.2)
Sodium: 145 mmol/L — ABNORMAL HIGH (ref 134–144)

## 2018-11-10 LAB — B12 AND FOLATE PANEL
Folate: 5.1 ng/mL (ref >3.0)
Vitamin B-12: 1630 pg/mL — ABNORMAL HIGH (ref 232–1245)

## 2018-11-10 LAB — CBC
HEMOGLOBIN: 13.4 g/dL (ref 11.1–15.9)
Hematocrit: 40.9 % (ref 34.0–46.6)
MCH: 28.9 pg (ref 26.6–33.0)
MCHC: 32.8 g/dL (ref 31.5–35.7)
MCV: 88 fL (ref 79–97)
Platelets: 247 10*3/uL (ref 150–450)
RBC: 4.63 x10E6/uL (ref 3.77–5.28)
RDW: 12.5 % (ref 12.3–15.4)
WBC: 7.3 10*3/uL (ref 3.4–10.8)

## 2018-11-10 LAB — FERRITIN: Ferritin: 147 ng/mL (ref 15–150)

## 2018-11-10 LAB — T4, FREE: Free T4: 1.45 ng/dL (ref 0.82–1.77)

## 2018-11-10 LAB — TSH: TSH: 2.75 u[IU]/mL (ref 0.450–4.500)

## 2018-11-12 ENCOUNTER — Other Ambulatory Visit: Payer: Self-pay

## 2018-11-14 ENCOUNTER — Other Ambulatory Visit: Payer: Self-pay | Admitting: Nurse Practitioner

## 2018-11-14 DIAGNOSIS — E559 Vitamin D deficiency, unspecified: Secondary | ICD-10-CM

## 2018-11-14 MED ORDER — VITAMIN D (ERGOCALCIFEROL) 1.25 MG (50000 UNIT) PO CAPS: 50000.0000 [IU] | ORAL_CAPSULE | ORAL | 5 refills | Status: DC

## 2018-11-14 NOTE — Progress Notes (Signed)
Refilled drisdol per pharmacy request.

## 2018-11-19 ENCOUNTER — Telehealth: Payer: Self-pay

## 2018-11-19 NOTE — Telephone Encounter (Signed)
Pt advised for labs are normal

## 2018-12-13 ENCOUNTER — Other Ambulatory Visit: Payer: Self-pay

## 2018-12-13 DIAGNOSIS — D860 Sarcoidosis of lung: Secondary | ICD-10-CM

## 2018-12-13 MED ORDER — TRELEGY ELLIPTA 100-62.5-25 MCG/INH IN AEPB: 1.0000 | INHALATION_SPRAY | Freq: Every day | RESPIRATORY_TRACT | 5 refills | Status: AC

## 2018-12-20 ENCOUNTER — Encounter: Payer: Self-pay | Admitting: Nurse Practitioner

## 2018-12-20 ENCOUNTER — Ambulatory Visit: Payer: BLUE CROSS/BLUE SHIELD | Admitting: Nurse Practitioner

## 2018-12-20 VITALS — BP 135/77 | HR 80 | Resp 16 | Ht 59.0 in | Wt 149.0 lb

## 2018-12-20 DIAGNOSIS — G2581 Restless legs syndrome: Secondary | ICD-10-CM | POA: Diagnosis not present

## 2018-12-20 DIAGNOSIS — I10 Essential (primary) hypertension: Secondary | ICD-10-CM

## 2018-12-20 MED ORDER — ROPINIROLE HCL 0.25 MG PO TABS: 0.2500 mg | ORAL_TABLET | Freq: Every day | ORAL | 3 refills | Status: DC

## 2018-12-20 NOTE — Progress Notes (Signed)
[facility] [address] [place], [place] [address]  Internal MEDICINE  Office Visit Note  Patient Name: Marissa Luna  [id number]  [id number]  Date of Service: 12/20/2018   Pt is here for a sick visit.  Chief Complaint  Patient presents with  . Leg Pain    both going for 4 weeks      The patient has been having pain in right lower leg, mostly at night. Will wake her up at night. Feels like cramping. States that one night last week, she felt like she had fever in her lower leg. Used to have restless legs, but this has not bothered her in some time. She has noted this cramping sensation at night for about the last month. This is intermittent. Does not happen every night.  Has not been on medication for this in several months. The patient did have labs done in 11/2018. Her potassium, calcium, and sodium levels were normal, as was blood count and iron levels.        Current Medication:  Outpatient Encounter Medications as of 12/20/2018  Medication Sig  . albuterol (PROAIR HFA) 108 (90 Base) MCG/ACT inhaler Inhale 2 puffs into the lungs every 4 (four) hours as needed for wheezing or shortness of breath.  Marland Kitchen albuterol (PROVENTIL HFA;VENTOLIN HFA) 108 (90 Base) MCG/ACT inhaler INHALE 2 PUFFS 4 TIMES A DAY AS NEEDED FOR WHEEZING  . albuterol (PROVENTIL) (2.5 MG/3ML) 0.083% nebulizer solution Take 3 mLs (2.5 mg total) by nebulization every 6 (six) hours as needed.  . ALPRAZolam (XANAX) 0.25 MG tablet Take 1 tablet (0.25 mg total) by mouth 2 (two) times daily as needed for anxiety.  Marland Kitchen amLODipine (NORVASC) 10 MG tablet Take 1 tablet (10 mg total) by mouth daily.  Marland Kitchen amLODipine (NORVASC) 5 MG tablet Take 5 mg by mouth daily.  . Calcium Carbonate-Vitamin D 600-400 MG-UNIT tablet Take by mouth.  . cetirizine (ZYRTEC) 10 MG tablet Take 10 mg by mouth.  . fluticasone (FLONASE) 50 MCG/ACT nasal spray Place into the nose.  . folic acid (FOLVITE) 1 MG tablet Take 1 mg by mouth.  Marland Kitchen  ibuprofen (ADVIL,MOTRIN) 800 MG tablet Take 1 tablet (800 mg total) by mouth 3 (three) times daily.  . methotrexate (RHEUMATREX) 2.5 MG tablet Take by mouth.  . pneumococcal 13-valent conjugate vaccine (PREVNAR 13) SUSP injection Inject 0.5 mLs into the muscle tomorrow at 10 am.  . pneumococcal 23 valent vaccine (PNEUMOVAX 23) 25 MCG/0.5ML injection Inject 0.60ml IM once  . Polyethylene Glycol 3350 (PEG 3350) POWD Take as directed for colonoscopy prep.  . predniSONE (DELTASONE) 5 MG tablet Take 1 tablet (5 mg total) by mouth daily with breakfast.  . promethazine-codeine (PHENERGAN WITH CODEINE) 6.25-10 MG/5ML syrup Take 5 mLs by mouth every 8 (eight) hours as needed for cough.  Marland Kitchen rOPINIRole (REQUIP) 0.25 MG tablet Take 1 tablet (0.25 mg total) by mouth at bedtime.  . TRELEGY ELLIPTA 100-62.5-25 MCG/INH AEPB Inhale 1 puff into the lungs daily.  Marland Kitchen triamcinolone ointment (KENALOG) 0.1 % Apply twice daily as needed to affected areas on body and face  . Vitamin D, Ergocalciferol, (DRISDOL) 1.25 MG (50000 UT) CAPS capsule Take 1 capsule (50,000 Units total) by mouth once a week.  . [DISCONTINUED] rOPINIRole (REQUIP) 0.25 MG tablet nightly as needed.   No facility-administered encounter medications on file as of 12/20/2018.       Medical History: Past Medical History:  Diagnosis Date  . Asthma   . Environmental allergies   .  Hypertension   . Sarcoidosis      Today's Vitals   12/20/18 0839  BP: 135/77  Pulse: 80  Resp: 16  SpO2: 96%  Weight: 149 lb (67.6 kg)  Height: 4\' 11"  (1.499 m)    Review of Systems  Constitutional: Negative for activity change, chills, fatigue and unexpected weight change.  HENT: Negative for congestion, postnasal drip, rhinorrhea, sneezing and sore throat.   Respiratory: Negative for cough, chest tightness and shortness of breath.   Cardiovascular: Negative for chest pain and palpitations.  Gastrointestinal: Negative for abdominal pain, constipation, diarrhea,  nausea and vomiting.  Endocrine: Negative for cold intolerance, heat intolerance, polydipsia and polyuria.  Genitourinary: Negative for dysuria and frequency.  Musculoskeletal: Positive for myalgias. Negative for arthralgias, back pain, joint swelling and neck pain.       Right lower leg cramping. Waking her up at night  Skin: Negative for rash.  Allergic/Immunologic: Negative for environmental allergies.  Neurological: Negative for tremors and numbness.  Hematological: Negative for adenopathy. Does not bruise/bleed easily.  Psychiatric/Behavioral: Negative for behavioral problems (Depression), sleep disturbance and suicidal ideas. The patient is not nervous/anxious.     Physical Exam Vitals signs and nursing note reviewed.  Constitutional:      General: She is not in acute distress.    Appearance: Normal appearance. She is well-developed. She is not diaphoretic.  HENT:     Head: Normocephalic and atraumatic.     Mouth/Throat:     Pharynx: No oropharyngeal exudate.  Eyes:     Pupils: Pupils are equal, round, and reactive to light.  Neck:     Musculoskeletal: Normal range of motion and neck supple.     Thyroid: No thyromegaly.     Vascular: No JVD.     Trachea: No tracheal deviation.  Cardiovascular:     Rate and Rhythm: Normal rate and regular rhythm.     Heart sounds: Normal heart sounds. No murmur. No friction rub. No gallop.   Pulmonary:     Effort: Pulmonary effort is normal. No respiratory distress.     Breath sounds: Normal breath sounds. No wheezing or rales.  Chest:     Chest wall: No tenderness.  Abdominal:     General: Bowel sounds are normal.     Palpations: Abdomen is soft.  Musculoskeletal: Normal range of motion.     Comments: Currently no tenderness with palpation of the right lost leg. No redness or swelling present. Negative homan's sign. pedal pulse palpable .  Lymphadenopathy:     Cervical: No cervical adenopathy.  Skin:    General: Skin is warm and dry.      Capillary Refill: Capillary refill takes less than 2 seconds.  Neurological:     Mental Status: She is alert and oriented to person, place, and time.     Cranial Nerves: No cranial nerve deficit.  Psychiatric:        Behavior: Behavior normal.        Thought Content: Thought content normal.        Judgment: Judgment normal.   Assessment/Plan: 1. Restless leg syndrome Restart ropinerol 0.25mg  at night to reduce symptoms.  - rOPINIRole (REQUIP) 0.25 MG tablet; Take 1 tablet (0.25 mg total) by mouth at bedtime.  Dispense: 30 tablet; Refill: 3  2. Essential hypertension Stable. Continue bp medications as prescribed   General Counseling: Jawana verbalizes understanding of the findings of todays visit and agrees with plan of treatment. I have discussed any further diagnostic evaluation that  may be needed or ordered today. We also reviewed her medications today. she has been encouraged to call the office with any questions or concerns that should arise related to todays visit.    Counseling:  This patient was seen by Vincent GrosHeather Mekisha Bittel FNP Collaboration with Dr Lyndon CodeFozia M Khan as a part of collaborative care agreement  Meds ordered this encounter  Medications  . rOPINIRole (REQUIP) 0.25 MG tablet    Sig: Take 1 tablet (0.25 mg total) by mouth at bedtime.    Dispense:  30 tablet    Refill:  3    Order Specific Question:   Supervising Provider    Answer:   Lyndon CodeKHAN, FOZIA M [[id number]]    Time spent: 15 Minutes

## 2019-01-30 ENCOUNTER — Encounter: Payer: Self-pay | Admitting: Adult Health

## 2019-01-30 ENCOUNTER — Ambulatory Visit: Payer: BLUE CROSS/BLUE SHIELD | Admitting: Adult Health

## 2019-01-30 VITALS — BP 136/60 | HR 89 | Resp 16 | Ht 59.0 in | Wt 152.0 lb

## 2019-01-30 DIAGNOSIS — D860 Sarcoidosis of lung: Secondary | ICD-10-CM | POA: Diagnosis not present

## 2019-01-30 DIAGNOSIS — Z9981 Dependence on supplemental oxygen: Secondary | ICD-10-CM | POA: Diagnosis not present

## 2019-01-30 DIAGNOSIS — R0602 Shortness of breath: Secondary | ICD-10-CM | POA: Diagnosis not present

## 2019-01-30 DIAGNOSIS — M79604 Pain in right leg: Secondary | ICD-10-CM

## 2019-01-30 DIAGNOSIS — M79605 Pain in left leg: Secondary | ICD-10-CM

## 2019-01-30 DIAGNOSIS — J9611 Chronic respiratory failure with hypoxia: Secondary | ICD-10-CM

## 2019-01-30 MED ORDER — IBUPROFEN 800 MG PO TABS: 800.0000 mg | ORAL_TABLET | Freq: Three times a day (TID) | ORAL | 1 refills | Status: DC

## 2019-01-30 NOTE — Patient Instructions (Signed)
Sarcoidosis  Sarcoidosis is a disease that can cause inflammation in many areas of the body. It most often affects the lungs (pulmonary sarcoidosis). Sarcoidosis can also affect the lymph nodes, liver, eyes, skin, heart, or any other body tissue. Normally, cells that are part of your body's disease-fighting system (immune system) attack harmful substances (such as germs) in your body. This immune system response causes inflammation. After the harmful substance is destroyed, the inflammation and the immune cells go away. When you have sarcoidosis, your immune system causes inflammation even when there are no harmful substances, and the inflammation does not go away. Sarcoidosis also causes cells from your immune system to form small clumps of tissue (granulomas) in the affected area of your body. What are the causes? The exact cause of sarcoidosis is not known.  It is possible that if you have a family history of this disease (genetic predisposition), the immune system response that leads to inflammation may be triggered by something in your environment, such as:  Bacteria or viruses.  Metals.  Chemicals.  Dust.  Mold or mildew. What increases the risk? You may be at a greater risk for sarcoidosis if you:  Have a family history of the disease.  Are African-American.  Are of Northern European descent.  Are 20-50 years old.  Work as a firefighter.  Work in an environment where you are exposed to metals, chemicals, mold or mildew, or insecticides. What are the signs or symptoms? Some people with sarcoidosis have no symptoms. Others have very mild symptoms. The symptoms usually depend on the organ that is affected. Sarcoidosis most often affects the lungs, which may include symptoms such as:  Chest pain.  Coughing.  Wheezing.  Shortness of breath. Other common symptoms include:  Night sweats.  Fever.  Weight loss.  Fatigue.  Swollen lymph nodes.  Joint pain. How is  this diagnosed? Sarcoidosis may be diagnosed based on:  Your symptoms and medical history.  A physical exam.  Imaging tests to check for granulomas such as: ? Chest X-ray. ? CT scan. ? MRI. ? PET scan.  Lung function tests. These tests evaluate your breathing and check for problems that may be related to sarcoidosis.  A procedure to remove a tissue sample for testing (biopsy). You may have a biopsy of lung tissue if that is where you are having symptoms. You may have tests to check for any complications of the condition. These tests may include:  Eye exams.  MRI of the heart or brain.  Echocardiogram.  Electrocardiogram (EKG or ECG). How is this treated? In some cases, sarcoidosis does not require a specific treatment because it causes no symptoms or mild symptoms. If your symptoms bother you or are severe, you may be prescribed medicines to reduce inflammation or relieve symptoms. These medicines may include:  Prednisone. This is a steroid that reduces inflammation related to sarcoidosis.  Hydroxychloroquine. This may be used to treat sarcoidosis that affects the skin, eyes, or brain.  Methotrexate, leflunomide, or azathioprine. These medicines affect the immune system and can help with sarcoidosis in the joints, eyes, skin, or lungs.  Medicines that you breathe in (inhalers). Inhalers can help you breathe if sarcoidosis affects your lungs. Follow these instructions at home:   Do not use any products that contain nicotine or tobacco, such as cigarettes and e-cigarettes. If you need help quitting, ask your health care provider.  Avoid secondhand smoke and irritating dust or chemicals. Stay indoors on days when air quality is poor   in your area.  Return to your normal activities as told by your health care provider. Ask your health care provider what activities are safe for you.  Take or use over-the-counter and prescription medicines only as told by your health care  provider.  Keep all follow-up visits as told by your health care provider. This is important. Contact a health care provider if:  You have vision problems.  You have a dry cough that does not go away.  You have an irregular heartbeat.  You have pain or aches in your joints, hands, or feet.  You have an unexplained rash. Get help right away if:  You have chest pain.  You have difficulty breathing. Summary  Sarcoidosis is a disease that can cause inflammation in many body areas of the body. It most often affects the lungs (pulmonary sarcoidosis). It can also affect the lymph nodes, liver, eyes, skin, heart, or any other body tissue.  When you have sarcoidosis, cells from your immune system form small clumps of tissue (granulomas) in the affected area of your body.  Sarcoidosis sometimes does not require a specific treatment because it causes no symptoms or mild symptoms.  If your symptoms bother you or are severe, you may be prescribed medicines to reduce inflammation or relieve symptoms. This information is not intended to replace advice given to you by your health care provider. Make sure you discuss any questions you have with your health care provider. Document Released: 09/21/2004 Document Revised: 08/29/2017 Document Reviewed: 08/29/2017 Elsevier Interactive Patient Education  2019 [employer]  

## 2019-01-30 NOTE — Progress Notes (Signed)
[facility] [address] [place], [place] [address]  Pulmonary Sleep Medicine   Office Visit Note  Patient Name: Marissa Luna DOB: 27-Feb-1956 MRN [id number]  Date of Service: 01/30/2019  Complaints/HPI: Pt here for follow up on sarcoidosis and chronic respiratory failure with hypoxia.  Pt is doing well at this time.  She denies any recent hospital admissions.  No chest pain, SOB, palpitations or sinus issues.    ROS  General: (-) fever, (-) chills, (-) night sweats, (-) weakness Skin: (-) rashes, (-) itching,. Eyes: (-) visual changes, (-) redness, (-) itching. Nose and Sinuses: (-) nasal stuffiness or itchiness, (-) postnasal drip, (-) nosebleeds, (-) sinus trouble. Mouth and Throat: (-) sore throat, (-) hoarseness. Neck: (-) swollen glands, (-) enlarged thyroid, (-) neck pain. Respiratory: - cough, (-) bloody sputum, - shortness of breath, - wheezing. Cardiovascular: - ankle swelling, (-) chest pain. Lymphatic: (-) lymph node enlargement. Neurologic: (-) numbness, (-) tingling. Psychiatric: (-) anxiety, (-) depression   Current Medication: Outpatient Encounter Medications as of 01/30/2019  Medication Sig  . albuterol (PROAIR HFA) 108 (90 Base) MCG/ACT inhaler Inhale 2 puffs into the lungs every 4 (four) hours as needed for wheezing or shortness of breath.  Marland Kitchen albuterol (PROVENTIL HFA;VENTOLIN HFA) 108 (90 Base) MCG/ACT inhaler INHALE 2 PUFFS 4 TIMES A DAY AS NEEDED FOR WHEEZING  . albuterol (PROVENTIL) (2.5 MG/3ML) 0.083% nebulizer solution Take 3 mLs (2.5 mg total) by nebulization every 6 (six) hours as needed.  . ALPRAZolam (XANAX) 0.25 MG tablet Take 1 tablet (0.25 mg total) by mouth 2 (two) times daily as needed for anxiety.  Marland Kitchen amLODipine (NORVASC) 10 MG tablet Take 1 tablet (10 mg total) by mouth daily.  Marland Kitchen amLODipine (NORVASC) 5 MG tablet Take 5 mg by mouth daily.  . Calcium Carbonate-Vitamin D 600-400 MG-UNIT tablet Take by mouth.  . cetirizine (ZYRTEC) 10  MG tablet Take 10 mg by mouth.  . fluticasone (FLONASE) 50 MCG/ACT nasal spray Place into the nose.  . folic acid (FOLVITE) 1 MG tablet Take 1 mg by mouth.  Marland Kitchen ibuprofen (ADVIL,MOTRIN) 800 MG tablet Take 1 tablet (800 mg total) by mouth 3 (three) times daily.  . methotrexate (RHEUMATREX) 2.5 MG tablet Take by mouth.  . pneumococcal 13-valent conjugate vaccine (PREVNAR 13) SUSP injection Inject 0.5 mLs into the muscle tomorrow at 10 am.  . pneumococcal 23 valent vaccine (PNEUMOVAX 23) 25 MCG/0.5ML injection Inject 0.75ml IM once  . Polyethylene Glycol 3350 (PEG 3350) POWD Take as directed for colonoscopy prep.  . predniSONE (DELTASONE) 5 MG tablet Take 1 tablet (5 mg total) by mouth daily with breakfast.  . promethazine-codeine (PHENERGAN WITH CODEINE) 6.25-10 MG/5ML syrup Take 5 mLs by mouth every 8 (eight) hours as needed for cough.  Marland Kitchen rOPINIRole (REQUIP) 0.25 MG tablet Take 1 tablet (0.25 mg total) by mouth at bedtime.  . TRELEGY ELLIPTA 100-62.5-25 MCG/INH AEPB Inhale 1 puff into the lungs daily.  Marland Kitchen triamcinolone ointment (KENALOG) 0.1 % Apply twice daily as needed to affected areas on body and face  . Vitamin D, Ergocalciferol, (DRISDOL) 1.25 MG (50000 UT) CAPS capsule Take 1 capsule (50,000 Units total) by mouth once a week.   No facility-administered encounter medications on file as of 01/30/2019.     Surgical History: Past Surgical History:  Procedure Laterality Date  . CHOLECYSTECTOMY    . COLONOSCOPY N/A 10/26/2015   Procedure: COLONOSCOPY;  Surgeon: Wallace Cullens, MD;  Location: [facility] ENDOSCOPY;  Service: Gastroenterology;  Laterality:  N/A;    Medical History: Past Medical History:  Diagnosis Date  . Asthma   . Environmental allergies   . Hypertension   . Sarcoidosis     Family History: Family History  Problem Relation Age of Onset  . Lung cancer Mother   . Colon cancer Father   . Diabetes Sister     Social History: Social History   Socioeconomic History  . Marital  status: Married    Spouse name: Not on file  . Number of children: Not on file  . Years of education: Not on file  . Highest education level: Not on file  Occupational History  . Not on file  Social Needs  . Financial resource strain: Not on file  . Food insecurity:    Worry: Not on file    Inability: Not on file  . Transportation needs:    Medical: Not on file    Non-medical: Not on file  Tobacco Use  . Smoking status: Never Smoker  . Smokeless tobacco: Never Used  Substance and Sexual Activity  . Alcohol use: No  . Drug use: No  . Sexual activity: Not on file  Lifestyle  . Physical activity:    Days per week: Not on file    Minutes per session: Not on file  . Stress: Not on file  Relationships  . Social connections:    Talks on phone: Not on file    Gets together: Not on file    Attends religious service: Not on file    Active member of club or organization: Not on file    Attends meetings of clubs or organizations: Not on file    Relationship status: Not on file  . Intimate partner violence:    Fear of current or ex partner: Not on file    Emotionally abused: Not on file    Physically abused: Not on file    Forced sexual activity: Not on file  Other Topics Concern  . Not on file  Social History Narrative  . Not on file    Vital Signs: There were no vitals taken for this visit.  Examination: General Appearance: The patient is well-developed, well-nourished, and in no distress. Skin: Gross inspection of skin unremarkable. Head: normocephalic, no gross deformities. Eyes: no gross deformities noted. ENT: ears appear grossly normal no exudates. Neck: Supple. No thyromegaly. No LAD. Respiratory: clear bilaterally. Cardiovascular: Normal S1 and S2 without murmur or rub. Extremities: No cyanosis. pulses are equal. Neurologic: Alert and oriented. No involuntary movements.  LABS: Recent Results (from the past 2160 hour(s))  CBC     Status: None   Collection  Time: 11/09/18  9:37 AM  Result Value Ref Range   WBC 7.3 3.4 - 10.8 x10E3/uL   RBC 4.63 3.77 - 5.28 x10E6/uL   Hemoglobin 13.4 11.1 - 15.9 g/dL   Hematocrit 29.1 91.6 - 46.6 %   MCV 88 79 - 97 fL   MCH 28.9 26.6 - 33.0 pg   MCHC 32.8 31.5 - 35.7 g/dL   RDW 60.6 00.4 - 59.9 %   Platelets 247 150 - 450 x10E3/uL  Basic metabolic panel     Status: Abnormal   Collection Time: 11/09/18  9:37 AM  Result Value Ref Range   Glucose 98 65 - 99 mg/dL   BUN 18 8 - 27 mg/dL   Creatinine, Ser 7.74 0.57 - 1.00 mg/dL   GFR calc non Af Amer 62 >59 mL/min/1.73   GFR calc Af  Amer 71 >59 mL/min/1.73   BUN/Creatinine Ratio 18 12 - 28   Sodium 145 (H) 134 - 144 mmol/L   Potassium 4.2 3.5 - 5.2 mmol/L   Chloride 105 96 - 106 mmol/L   CO2 26 20 - 29 mmol/L   Calcium 9.5 8.7 - 10.3 mg/dL  A21 and Folate Panel     Status: Abnormal   Collection Time: 11/09/18  9:37 AM  Result Value Ref Range   Vitamin B-12 1,630 (H) 232 - 1,245 pg/mL   Folate 5.1 >3.0 ng/mL    Comment: A serum folate concentration of less than 3.1 ng/mL is considered to represent clinical deficiency.   T4, free     Status: None   Collection Time: 11/09/18  9:37 AM  Result Value Ref Range   Free T4 1.45 0.82 - 1.77 ng/dL  TSH     Status: None   Collection Time: 11/09/18  9:37 AM  Result Value Ref Range   TSH 2.750 0.450 - 4.500 uIU/mL  Ferritin     Status: None   Collection Time: 11/09/18  9:37 AM  Result Value Ref Range   Ferritin 147 15 - 150 ng/mL    Radiology: Mm 3d Screen Breast Bilateral  Result Date: 06/15/2018 CLINICAL DATA:  Screening. EXAM: DIGITAL SCREENING BILATERAL MAMMOGRAM WITH TOMO AND CAD COMPARISON:  Previous exam(s). ACR Breast Density Category b: There are scattered areas of fibroglandular density. FINDINGS: There are no findings suspicious for malignancy. Images were processed with CAD. IMPRESSION: No mammographic evidence of malignancy. A result letter of this screening mammogram will be mailed directly to  the patient. RECOMMENDATION: Screening mammogram in one year. (Code:SM-B-01Y) BI-RADS CATEGORY  1: Negative. Electronically Signed   By: Baird Lyons M.D.   On: 06/15/2018 10:56    No results found.  No results found.    Assessment and Plan: Patient Active Problem List   Diagnosis Date Noted  . Restless leg syndrome 12/20/2018  . Oxygen dependent 10/17/2018  . Other fatigue 10/17/2018  . Abnormal weight gain 10/17/2018  . Sarcoidosis   . Need for prophylactic vaccination against Streptococcus pneumoniae (pneumococcus) 07/11/2018  . Screening for breast cancer 03/25/2018  . Dysuria 03/25/2018  . Chronic respiratory failure with hypoxia (HCC) 03/13/2018  . Acute bronchitis with COPD (HCC) 02/22/2018  . Lymphocytosis 02/22/2018  . Abnormal kidney function 02/22/2018  . Sepsis (HCC) 01/11/2018  . Community acquired pneumonia of left lung 01/11/2018  . Influenza A 01/11/2018  . 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 Hypoxemia 01/08/2018  . Allergic rhinitis, unspecified 01/08/2018  . Essential hypertension 01/08/2018  . 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 Shortness of breath 01/08/2018    1. Sarcoidosis of lung (HCC) Advanced disease.  Continue to use oygen at night.  As well as through out the day as needed.    2. Chronic respiratory failure with hypoxia (HCC) Pts FEV1 is improved at this time.  Continue to use oxygen as discussed.   3. Oxygen dependent Continue oxygen use at 2lpm via [place]  4. SOB (shortness of breath) FVC is 1.6 which is 79% of the pre-predicted value FEV1 is 1.3 which is 79% of the pre-predicted value FEV1/FVC is 78% which is 99% of the pre-predicted value on today spirometry. - Spirometry with Graph  General Counseling: I have discussed the findings of the evaluation and examination with Emiliya.  I have also discussed any further diagnostic evaluation thatmay be needed or ordered today. Gianne verbalizes understanding of the findings of  todays visit. We  also reviewed her medications today and discussed drug interactions and side effects including but not limited excessive drowsiness and altered mental states. We also discussed that there is always a risk not just to her but also people around her. she has been encouraged to call the office with any questions or concerns that should arise related to todays visit.    Time spent: 25 This patient was seen by Blima Ledger AGNP-C in Collaboration with Dr. Freda Munro as a part of collaborative care agreement.   I have personally obtained a history, examined the patient, evaluated laboratory and imaging results, formulated the assessment and plan and placed orders.    Yevonne Pax, MD [facility] Pulmonary and Critical Care Sleep medicine

## 2019-03-01 ENCOUNTER — Other Ambulatory Visit: Payer: Self-pay | Admitting: Nurse Practitioner

## 2019-03-12 ENCOUNTER — Other Ambulatory Visit: Payer: Self-pay | Admitting: Nurse Practitioner

## 2019-03-19 ENCOUNTER — Other Ambulatory Visit: Payer: Self-pay | Admitting: Nurse Practitioner

## 2019-03-19 DIAGNOSIS — G2581 Restless legs syndrome: Secondary | ICD-10-CM

## 2019-03-19 MED ORDER — ROPINIROLE HCL 0.25 MG PO TABS: 0.2500 mg | ORAL_TABLET | Freq: Every day | ORAL | 3 refills | Status: AC

## 2019-03-21 ENCOUNTER — Ambulatory Visit: Payer: BLUE CROSS/BLUE SHIELD | Admitting: Nurse Practitioner

## 2019-03-21 ENCOUNTER — Other Ambulatory Visit: Payer: Self-pay

## 2019-04-12 ENCOUNTER — Other Ambulatory Visit: Payer: Self-pay | Admitting: Nurse Practitioner

## 2019-04-27 ENCOUNTER — Encounter: Payer: Self-pay | Admitting: Emergency Medicine

## 2019-04-27 ENCOUNTER — Other Ambulatory Visit: Payer: Self-pay

## 2019-04-27 ENCOUNTER — Emergency Department
Admission: EM | Admit: 2019-04-27 | Discharge: 2019-04-27 | Disposition: A | Discharge status: Home / Self Care | Payer: BLUE CROSS/BLUE SHIELD | Attending: Emergency Medicine | Admitting: Emergency Medicine

## 2019-04-27 ENCOUNTER — Emergency Department: Payer: BLUE CROSS/BLUE SHIELD

## 2019-04-27 DIAGNOSIS — Z79899 Other long term (current) drug therapy: Secondary | ICD-10-CM | POA: Diagnosis not present

## 2019-04-27 DIAGNOSIS — W010XXA Fall on same level from slipping, tripping and stumbling without subsequent striking against object, initial encounter: Secondary | ICD-10-CM | POA: Diagnosis not present

## 2019-04-27 DIAGNOSIS — I10 Essential (primary) hypertension: Secondary | ICD-10-CM | POA: Insufficient documentation

## 2019-04-27 DIAGNOSIS — S3992XA Unspecified injury of lower back, initial encounter: Secondary | ICD-10-CM | POA: Diagnosis present

## 2019-04-27 DIAGNOSIS — Y92009 Unspecified place in unspecified non-institutional (private) residence as the place of occurrence of the external cause: Secondary | ICD-10-CM | POA: Diagnosis not present

## 2019-04-27 DIAGNOSIS — Y998 Other external cause status: Secondary | ICD-10-CM | POA: Insufficient documentation

## 2019-04-27 DIAGNOSIS — Y9389 Activity, other specified: Secondary | ICD-10-CM | POA: Insufficient documentation

## 2019-04-27 DIAGNOSIS — S300XXA Contusion of lower back and pelvis, initial encounter: Secondary | ICD-10-CM | POA: Insufficient documentation

## 2019-04-27 DIAGNOSIS — W19XXXA Unspecified fall, initial encounter: Secondary | ICD-10-CM

## 2019-04-27 MED ORDER — CYCLOBENZAPRINE HCL 10 MG PO TABS
10.0000 mg | ORAL_TABLET | Freq: Once | ORAL | Status: AC
  Administered 2019-04-27: 21:00:00 10 mg via ORAL
  Filled 2019-04-27: qty 1

## 2019-04-27 MED ORDER — CYCLOBENZAPRINE HCL 5 MG PO TABS: 5.0000 mg | ORAL_TABLET | Freq: Three times a day (TID) | ORAL | 0 refills | Status: DC | PRN

## 2019-04-27 NOTE — Discharge Instructions (Addendum)
Your exam and XR are negative for a fracture to the tailbone. You have bruising and a contusion. Apply warm, moist compresses and take the muscle relaxant as needed. Follow-up with your provider as needed.

## 2019-04-27 NOTE — ED Triage Notes (Signed)
Pt ambulatory to triage with c/o of pain to left leg after falling last Sunday. Pt states when bearing weight pain goes up leg to back.

## 2019-04-29 NOTE — ED Provider Notes (Signed)
[facility] Emergency Department Provider Note ____________________________________________  Time seen: 1956  I have reviewed the triage vital signs and the nursing notes.  HISTORY  Chief Complaint  Fall   HPI Marissa Luna is a 63 y.o. female presents herself to the ED for evaluation of pain to the buttock and coccyx after a mechanical fall. She describes a slip and fall at home last Sunday. She has had ongoing pain to the buttock and tailbone since the fall. She reports pain with transitioning from sit to stand. She denies any head injury, LOC, incontinence, paresthesias, or saddle anesthesias.   Past Medical History:  Diagnosis Date  . Asthma   . Environmental allergies   . Hypertension   . Sarcoidosis     Patient Active Problem List   Diagnosis Date Noted  . Restless leg syndrome 12/20/2018  . Oxygen dependent 10/17/2018  . Other fatigue 10/17/2018  . Abnormal weight gain 10/17/2018  . Sarcoidosis   . Need for prophylactic vaccination against Streptococcus pneumoniae (pneumococcus) 07/11/2018  . Screening for breast cancer 03/25/2018  . Dysuria 03/25/2018  . Chronic respiratory failure with hypoxia (HCC) 03/13/2018  . Acute bronchitis with COPD (HCC) 02/22/2018  . Lymphocytosis 02/22/2018  . Abnormal kidney function 02/22/2018  . Sepsis (HCC) 01/11/2018  . Community acquired pneumonia of left lung 01/11/2018  . Influenza A 01/11/2018  . 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 Hypoxemia 01/08/2018  . Allergic rhinitis, unspecified 01/08/2018  . Essential hypertension 01/08/2018  . 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 Shortness of breath 01/08/2018    Past Surgical History:  Procedure Laterality Date  . CHOLECYSTECTOMY    . COLONOSCOPY N/A 10/26/2015   Procedure: COLONOSCOPY;  Surgeon: Wallace Cullens, MD;  Location: [facility] ENDOSCOPY;  Service: Gastroenterology;  Laterality: N/A;    Prior to Admission medications   Medication Sig Start Date End  Date Taking? Authorizing Provider  albuterol (PROVENTIL HFA;VENTOLIN HFA) 108 (90 Base) MCG/ACT inhaler INHALE 2 PUFFS 4 TIMES A DAY AS NEEDED FOR WHEEZING 09/27/18   Boscia, Heather E, NP  albuterol (PROVENTIL) (2.5 MG/3ML) 0.083% nebulizer solution Take 3 mLs (2.5 mg total) by nebulization every 6 (six) hours as needed. 06/12/18   Carlean Jews, NP  ALPRAZolam (XANAX) 0.25 MG tablet Take 1 tablet (0.25 mg total) by mouth 2 (two) times daily as needed for anxiety. 12/06/17   Carlean Jews, NP  amLODipine (NORVASC) 10 MG tablet Take 1 tablet (10 mg total) by mouth daily. 07/03/18   Carlean Jews, NP  Calcium Carbonate-Vitamin D 600-400 MG-UNIT tablet Take by mouth.    [provider]  cetirizine (ZYRTEC) 10 MG tablet Take 10 mg by mouth.    [provider]  cyclobenzaprine (FLEXERIL) 5 MG tablet Take 1 tablet (5 mg total) by mouth 3 (three) times daily as needed. 04/27/19   Biannca Scantlin, Charlesetta Ivory, PA-C  fluticasone (FLONASE) 50 MCG/ACT nasal spray Place into the nose.    [provider]  folic acid (FOLVITE) 1 MG tablet Take 1 mg by mouth.    [provider]  ibuprofen (ADVIL,MOTRIN) 800 MG tablet Take 1 tablet (800 mg total) by mouth 3 (three) times daily. 01/30/19   Johnna Acosta, NP  OXYGEN Inhale 2 L into the lungs.    [provider]  pneumococcal 23 valent vaccine (PNEUMOVAX 23) 25 MCG/0.5ML injection Inject 0.51ml IM once 06/12/18   Carlean Jews, NP  predniSONE (DELTASONE) 5 MG tablet Take 1  tablet (5 mg total) by mouth daily with breakfast. 10/30/18   Johnna Acosta, NP  rOPINIRole (REQUIP) 0.25 MG tablet Take 1 tablet (0.25 mg total) by mouth at bedtime. 03/19/19   Boscia, Kathlynn Grate, NP  TRELEGY ELLIPTA 100-62.5-25 MCG/INH AEPB Inhale 1 puff into the lungs daily. 12/13/18   Yevonne Pax, MD  triamcinolone ointment (KENALOG) 0.1 % Apply twice daily as needed to affected areas on body and face 05/01/15   [provider]  Vitamin  D, Ergocalciferol, (DRISDOL) 1.25 MG (50000 UT) CAPS capsule Take 1 capsule (50,000 Units total) by mouth once a week. 11/14/18   Carlean Jews, NP    Allergies Patient has no known allergies.  Family History  Problem Relation Age of Onset  . Lung cancer Mother   . Colon cancer Father   . Diabetes Sister     Social History Social History   Tobacco Use  . Smoking status: Never Smoker  . Smokeless tobacco: Never Used  Substance Use Topics  . Alcohol use: No  . Drug use: No    Review of Systems  Constitutional: Negative for fever. Eyes: Negative for visual changes. ENT: Negative for sore throat. Cardiovascular: Negative for chest pain. Respiratory: Negative for shortness of breath. Gastrointestinal: Negative for abdominal pain, vomiting and diarrhea. Genitourinary: Negative for dysuria. Musculoskeletal: Negative for back pain. Reports buttocks pain Skin: Negative for rash. Neurological: Negative for headaches, focal weakness or numbness. ____________________________________________  PHYSICAL EXAM:  VITAL SIGNS: ED Triage Vitals  Enc Vitals Group     BP 04/27/19 1909 (!) 129/56     Pulse Rate 04/27/19 1909 69     Resp 04/27/19 2039 18     Temp 04/27/19 1909 98.2 F (36.8 C)     Temp Source 04/27/19 1909 Oral     SpO2 04/27/19 1909 94 %     Weight --      Height 04/27/19 1913 4\' 11"  (1.499 m)     Head Circumference --      [place] --      Pain Score 04/27/19 1911 3     Pain Loc --      Pain Edu? --      Excl. in GC? --     Constitutional: Alert and oriented. Well appearing and in no distress. Head: Normocephalic and atraumatic. Eyes: Conjunctivae are normal. PERRL. Normal extraocular movements Cardiovascular: Normal rate, regular rhythm. Normal distal pulses. Respiratory: Normal respiratory effort. No wheezes/rales/rhonchi. Musculoskeletal: Normal spinal alignment. bruising  Noted to the right buttock at the gluteal cleft. Nontender with normal range  of motion in all extremities.  Neurologic: CN II-XII grossly intact. Normal hip flexion Normal gait without ataxia. Normal speech and language. No gross focal neurologic deficits are appreciated. Skin:  Skin is warm, dry and intact. No rash noted. Psychiatric: Mood and affect are normal. Patient exhibits appropriate insight and judgment. ___________________________________________   RADIOLOGY  Sacrum/Coccyx   Negative  __________________________________________  PROCEDURES  Procedures Flexeril 10 mg PO ____________________________________________  INITIAL IMPRESSION / ASSESSMENT AND PLAN / ED COURSE  Marissa Luna was evaluated in Emergency Department on 04/29/2019 for the symptoms described in the history of present illness. She was evaluated in the context of the global COVID-19 pandemic, which necessitated consideration that the patient might be at risk for infection with the SARS-CoV-2 virus that causes COVID-19. Institutional protocols and algorithms that pertain to the evaluation of patients at risk for COVID-19 are in a state of rapid change  based on information released by regulatory bodies including the CDC and federal and state organizations. These policies and algorithms were followed during the patient's care in the ED.  Patient with ED evaluation of buttock pain following a mechanical fall at home. She is reassured by her negative XR.She is discharged with a prescription for Cyclobenzaprine for muscle pain relief, of her buttock contusion. She will see her provoder as needed.  ____________________________________________  FINAL CLINICAL IMPRESSION(S) / ED DIAGNOSES  Final diagnoses:  Fall in home, initial encounter  Coccyx contusion, initial encounter      Lissa HoardMenshew, Bricia Taher V Bacon, PA-C 04/29/19 2334    Jene EveryKinner, Robert, MD 05/01/19 1744

## 2019-05-01 ENCOUNTER — Other Ambulatory Visit: Payer: Self-pay

## 2019-05-01 DIAGNOSIS — E559 Vitamin D deficiency, unspecified: Secondary | ICD-10-CM

## 2019-05-01 MED ORDER — VITAMIN D (ERGOCALCIFEROL) 1.25 MG (50000 UNIT) PO CAPS: 50000.0000 [IU] | ORAL_CAPSULE | ORAL | 5 refills | Status: DC

## 2019-05-16 ENCOUNTER — Other Ambulatory Visit: Payer: Self-pay | Admitting: Nurse Practitioner

## 2019-05-16 DIAGNOSIS — Z1231 Encounter for screening mammogram for malignant neoplasm of breast: Secondary | ICD-10-CM

## 2019-06-24 ENCOUNTER — Other Ambulatory Visit: Payer: Self-pay

## 2019-06-24 MED ORDER — AMLODIPINE BESYLATE 10 MG PO TABS: 10.0000 mg | ORAL_TABLET | Freq: Every day | ORAL | 0 refills | Status: DC

## 2019-06-25 ENCOUNTER — Encounter: Payer: Self-pay | Admitting: Nurse Practitioner

## 2019-06-25 ENCOUNTER — Ambulatory Visit: Payer: BLUE CROSS/BLUE SHIELD | Admitting: Nurse Practitioner

## 2019-06-25 ENCOUNTER — Other Ambulatory Visit: Payer: Self-pay

## 2019-06-25 VITALS — BP 146/76 | HR 86 | Resp 16 | Ht 59.0 in | Wt 157.0 lb

## 2019-06-25 DIAGNOSIS — Z124 Encounter for screening for malignant neoplasm of cervix: Secondary | ICD-10-CM | POA: Diagnosis not present

## 2019-06-25 DIAGNOSIS — M79604 Pain in right leg: Secondary | ICD-10-CM | POA: Diagnosis not present

## 2019-06-25 DIAGNOSIS — Z9981 Dependence on supplemental oxygen: Secondary | ICD-10-CM

## 2019-06-25 DIAGNOSIS — D869 Sarcoidosis, unspecified: Secondary | ICD-10-CM

## 2019-06-25 DIAGNOSIS — M79605 Pain in left leg: Secondary | ICD-10-CM

## 2019-06-25 DIAGNOSIS — Z0001 Encounter for general adult medical examination with abnormal findings: Secondary | ICD-10-CM

## 2019-06-25 DIAGNOSIS — R300 Dysuria: Secondary | ICD-10-CM

## 2019-06-25 DIAGNOSIS — I10 Essential (primary) hypertension: Secondary | ICD-10-CM | POA: Diagnosis not present

## 2019-06-25 MED ORDER — IBUPROFEN 800 MG PO TABS: 800.0000 mg | ORAL_TABLET | Freq: Three times a day (TID) | ORAL | 1 refills | Status: DC

## 2019-06-25 NOTE — Progress Notes (Signed)
[facility] [address] [place], [place] [id number]  Internal MEDICINE  Office Visit Note  Patient Name: Marissa MiyamotoKatie S Luna  [id number] 29, 2057  [id number]  Date of Service: 06/30/2019   Pt is here for routine health maintenance examination  Chief Complaint  Patient presents with  . Hypertension  . Shortness of Breath     The patient is here fr health maintenance exam and pap smear. She states that she is frustrated with weight gain. She has gained five pounds since february. She is eating more and not exercising as often since spread of covid 19 has been concern. She states that breathing is stable. Using oxygen every night. Will use it sometimes during the day when she feels short of breath. She is scheduled to have her screening mammogram tomorrow. Her labs were done in 11/2018 and are up to date.    Current Medication: Outpatient Encounter Medications as of 06/25/2019  Medication Sig  . albuterol (PROVENTIL HFA;VENTOLIN HFA) 108 (90 Base) MCG/ACT inhaler INHALE 2 PUFFS 4 TIMES A DAY AS NEEDED FOR WHEEZING  . albuterol (PROVENTIL) (2.5 MG/3ML) 0.083% nebulizer solution Take 3 mLs (2.5 mg total) by nebulization every 6 (six) hours as needed.  . ALPRAZolam (XANAX) 0.25 MG tablet Take 1 tablet (0.25 mg total) by mouth 2 (two) times daily as needed for anxiety.  Marland Kitchen. amLODipine (NORVASC) 10 MG tablet Take 1 tablet (10 mg total) by mouth daily.  . Calcium Carbonate-Vitamin D 600-400 MG-UNIT tablet Take by mouth.  . cetirizine (ZYRTEC) 10 MG tablet Take 10 mg by mouth.  . cyclobenzaprine (FLEXERIL) 5 MG tablet Take 1 tablet (5 mg total) by mouth 3 (three) times daily as needed.  . fluticasone (FLONASE) 50 MCG/ACT nasal spray Place into the nose.  . folic acid (FOLVITE) 1 MG tablet Take 1 mg by mouth.  Marland Kitchen. ibuprofen (ADVIL) 800 MG tablet Take 1 tablet (800 mg total) by mouth 3 (three) times daily.  . OXYGEN Inhale 2 L into the lungs.  . pneumococcal 23 valent vaccine (PNEUMOVAX 23) 25  MCG/0.5ML injection Inject 0.705ml IM once  . predniSONE (DELTASONE) 5 MG tablet Take 1 tablet (5 mg total) by mouth daily with breakfast.  . rOPINIRole (REQUIP) 0.25 MG tablet Take 1 tablet (0.25 mg total) by mouth at bedtime.  . TRELEGY ELLIPTA 100-62.5-25 MCG/INH AEPB Inhale 1 puff into the lungs daily.  Marland Kitchen. triamcinolone ointment (KENALOG) 0.1 % Apply twice daily as needed to affected areas on body and face  . Vitamin D, Ergocalciferol, (DRISDOL) 1.25 MG (50000 UT) CAPS capsule Take 1 capsule (50,000 Units total) by mouth once a week.  . [DISCONTINUED] ibuprofen (ADVIL,MOTRIN) 800 MG tablet Take 1 tablet (800 mg total) by mouth 3 (three) times daily.   No facility-administered encounter medications on file as of 06/25/2019.     Surgical History: Past Surgical History:  Procedure Laterality Date  . CHOLECYSTECTOMY    . COLONOSCOPY N/A 10/26/2015   Procedure: COLONOSCOPY;  Surgeon: Wallace CullensPaul Y Oh, MD;  [facility];  Service: Gastroenterology;  Laterality: N/A;    Medical History: Past Medical History:  Diagnosis Date  . Asthma   . Environmental allergies   . Hypertension   . Sarcoidosis     Family History: Family History  Problem Relation Age of Onset  . Lung cancer Mother   . Colon cancer Father   . Diabetes Sister   . Breast cancer Sister       Review of Systems  Constitutional: Negative for  activity change, chills, fatigue and unexpected weight change.  HENT: Negative for congestion, postnasal drip, rhinorrhea, sneezing and sore throat.   Respiratory: Negative for cough, chest tightness, shortness of breath and wheezing.   Cardiovascular: Negative for chest pain and palpitations.  Gastrointestinal: Negative for abdominal pain, constipation, diarrhea, nausea and vomiting.  Endocrine: Negative for cold intolerance, heat intolerance, polydipsia and polyuria.  Genitourinary: Negative for dysuria, frequency and urgency.  Musculoskeletal: Negative for arthralgias,  back pain, joint swelling, myalgias and neck pain.  Skin: Negative for rash.  Allergic/Immunologic: Negative for environmental allergies.  Neurological: Negative for dizziness, tremors, numbness and headaches.  Hematological: Negative for adenopathy. Does not bruise/bleed easily.  Psychiatric/Behavioral: Negative for behavioral problems (Depression), sleep disturbance and suicidal ideas. The patient is not nervous/anxious.      Today's Vitals   06/25/19 0914  BP: (!) 146/76  Pulse: 86  Resp: 16  SpO2: 97%  Weight: 157 lb (71.2 kg)  Height: 4\' 11"  (1.499 m)   Body mass index is 31.71 kg/m.  Physical Exam Vitals signs and nursing note reviewed.  Constitutional:      General: She is not in acute distress.    Appearance: Normal appearance. She is well-developed. She is not diaphoretic.  HENT:     Head: Normocephalic and atraumatic.     Mouth/Throat:     Pharynx: No oropharyngeal exudate.  Eyes:     Pupils: Pupils are equal, round, and reactive to light.  Neck:     Musculoskeletal: Normal range of motion and neck supple.     Thyroid: No thyromegaly.     Vascular: No carotid bruit or JVD.     Trachea: No tracheal deviation.  Cardiovascular:     Rate and Rhythm: Normal rate and regular rhythm.     Pulses: Normal pulses.     Heart sounds: Normal heart sounds. No murmur. No friction rub. No gallop.   Pulmonary:     Effort: Pulmonary effort is normal. No respiratory distress.     Breath sounds: Normal breath sounds. No wheezing or rales.  Chest:     Chest wall: No tenderness.     Breasts:        Right: Normal. No swelling, bleeding, inverted nipple, mass, nipple discharge, skin change or tenderness.        Left: Normal. No swelling, bleeding, inverted nipple, mass, nipple discharge, skin change or tenderness.  Abdominal:     General: Bowel sounds are normal.     Palpations: Abdomen is soft.     Tenderness: There is no abdominal tenderness.     Hernia: There is no hernia in  the left inguinal area or right inguinal area.  Genitourinary:    General: Normal vulva.     Exam position: Supine.     Labia:        Right: No rash or tenderness.        Left: No rash or tenderness.      Vagina: Normal. No vaginal discharge, erythema, tenderness or bleeding.     Cervix: No cervical motion tenderness, discharge, friability or erythema.     Uterus: Normal.      Adnexa: Right adnexa normal and left adnexa normal.     Comments: No tenderness, masses, or organomeglay present during bimanual exam . Musculoskeletal: Normal range of motion.     Comments: Currently no tenderness with palpation of the right lost leg. No redness or swelling present. Negative homan's sign. pedal pulse palpable .  Lymphadenopathy:  Cervical: No cervical adenopathy.     Lower Body: No right inguinal adenopathy. No left inguinal adenopathy.  Skin:    General: Skin is warm and dry.     Capillary Refill: Capillary refill takes less than 2 seconds.  Neurological:     Mental Status: She is alert and oriented to person, place, and time.     Cranial Nerves: No cranial nerve deficit.  Psychiatric:        Behavior: Behavior normal.        Thought Content: Thought content normal.        Judgment: Judgment normal.      LABS: Recent Results (from the past 2160 hour(s))  Urinalysis, Routine w reflex microscopic     Status: None   Collection Time: 06/25/19  9:20 AM  Result Value Ref Range   Specific Gravity, UA 1.019 1.005 - 1.030   pH, UA 6.5 5.0 - 7.5   Color, UA Yellow Yellow   Appearance Ur Clear Clear   Leukocytes,UA Negative Negative   Protein,UA Negative Negative/Trace   Glucose, UA Negative Negative   Ketones, UA Negative Negative   RBC, UA Negative Negative   Bilirubin, UA Negative Negative   Urobilinogen, Ur 1.0 0.2 - 1.0 mg/dL   Nitrite, UA Negative Negative   Microscopic Examination Comment     Comment: Microscopic not indicated and not performed.   Assessment/Plan: 1.  Encounter for general adult medical examination with abnormal findings Annual health maintenance exam and pap smear today.   2. Essential hypertension Stable. Continue bp medication as prescribed   3. Pain in both lower extremities May take ibuprofen 800mg  up to three times daily if needed for pain.  - ibuprofen (ADVIL) 800 MG tablet; Take 1 tablet (800 mg total) by mouth 3 (three) times daily.  Dispense: 30 tablet; Refill: 1  4. Sarcoidosis Continue regular visits with pulmonology as scheduled.   5. Oxygen dependent Oxygen as prescribed   6. Routine cervical smear - Pap IG and HPV (high risk) DNA detection  7. Dysuria - Urinalysis, Routine w reflex microscopic  General Counseling: Daisia verbalizes understanding of the findings of todays visit and agrees with plan of treatment. I have discussed any further diagnostic evaluation that may be needed or ordered today. We also reviewed her medications today. she has been encouraged to call the office with any questions or concerns that should arise related to todays visit.    Counseling:  This patient was seen by Vincent GrosHeather Tequisha Maahs FNP Collaboration with Dr Lyndon CodeFozia M Khan as a part of collaborative care agreement  Orders Placed This Encounter  Procedures  . Urinalysis, Routine w reflex microscopic    Meds ordered this encounter  Medications  . ibuprofen (ADVIL) 800 MG tablet    Sig: Take 1 tablet (800 mg total) by mouth 3 (three) times daily.    Dispense:  30 tablet    Refill:  1    Order Specific Question:   Supervising Provider    Answer:   Lyndon CodeKHAN, FOZIA M [[id number]]    Time spent: 4930 Minutes     Lyndon CodeFozia M Khan, MD  Internal Medicine

## 2019-06-26 ENCOUNTER — Ambulatory Visit
Admission: RE | Admit: 2019-06-26 | Discharge: 2019-06-26 | Disposition: A | Discharge status: Home / Self Care | Payer: BLUE CROSS/BLUE SHIELD | Source: Ambulatory Visit | Attending: Nurse Practitioner | Admitting: Nurse Practitioner

## 2019-06-26 DIAGNOSIS — Z1231 Encounter for screening mammogram for malignant neoplasm of breast: Secondary | ICD-10-CM | POA: Diagnosis not present

## 2019-06-26 LAB — URINALYSIS, ROUTINE W REFLEX MICROSCOPIC
Bilirubin, UA: NEGATIVE
Glucose, UA: NEGATIVE
Ketones, UA: NEGATIVE
Leukocytes,UA: NEGATIVE
Nitrite, UA: NEGATIVE
Protein,UA: NEGATIVE
RBC, UA: NEGATIVE
Specific Gravity, UA: 1.019 (ref 1.005–1.030)
Urobilinogen, Ur: 1 mg/dL (ref 0.2–1.0)
pH, UA: 6.5 (ref 5.0–7.5)

## 2019-06-27 ENCOUNTER — Other Ambulatory Visit: Payer: Self-pay | Admitting: Nurse Practitioner

## 2019-06-27 DIAGNOSIS — R928 Other abnormal and inconclusive findings on diagnostic imaging of breast: Secondary | ICD-10-CM

## 2019-06-27 DIAGNOSIS — N6489 Other specified disorders of breast: Secondary | ICD-10-CM

## 2019-06-30 DIAGNOSIS — M79604 Pain in right leg: Secondary | ICD-10-CM | POA: Insufficient documentation

## 2019-07-08 ENCOUNTER — Ambulatory Visit
Admission: RE | Admit: 2019-07-08 | Discharge: 2019-07-08 | Disposition: A | Discharge status: Home / Self Care | Payer: BLUE CROSS/BLUE SHIELD | Source: Ambulatory Visit | Attending: Nurse Practitioner | Admitting: Nurse Practitioner

## 2019-07-08 ENCOUNTER — Other Ambulatory Visit: Payer: Self-pay

## 2019-07-08 DIAGNOSIS — N6489 Other specified disorders of breast: Secondary | ICD-10-CM | POA: Diagnosis present

## 2019-07-08 DIAGNOSIS — R928 Other abnormal and inconclusive findings on diagnostic imaging of breast: Secondary | ICD-10-CM | POA: Insufficient documentation

## 2019-07-09 LAB — PAP IG AND HPV HIGH-RISK

## 2019-07-09 LAB — HPV, LOW VOLUME (REFLEX): HPV low volume reflex: POSITIVE — AB

## 2019-07-17 ENCOUNTER — Other Ambulatory Visit: Payer: Self-pay | Admitting: Nurse Practitioner

## 2019-07-17 ENCOUNTER — Telehealth: Payer: Self-pay | Admitting: Nurse Practitioner

## 2019-07-17 DIAGNOSIS — B3731 Acute candidiasis of vulva and vagina: Secondary | ICD-10-CM

## 2019-07-17 DIAGNOSIS — B373 Candidiasis of vulva and vagina: Secondary | ICD-10-CM

## 2019-07-17 MED ORDER — FLUCONAZOLE 150 MG PO TABS: ORAL_TABLET | ORAL | 0 refills | Status: DC

## 2019-07-17 NOTE — Progress Notes (Signed)
Pap positive for yeast infection. Caused pap to be unsatisfactory for evaluation. Will need to repeat at next visit. Treated yeast infection with diflucan. Take one tablet one time. If there is vaginal discharge or discomfort, take second after three days. Sent to her pharmacy.

## 2019-07-17 NOTE — Telephone Encounter (Signed)
-----   Message from Ronnell Freshwater, NP sent at 07/17/2019 12:25 PM EDT ----- Please let the patient know that Pap positive for yeast infection. Caused pap to be unsatisfactory for evaluation. Will need to repeat at next visit. Treated yeast infection with diflucan. Take one tablet one time. If there is vaginal discharge or di scomfort, take second after three days. Sent to her pharmacy. Can you let beth know we need to change next appointment to pap and will need 30 minute visit. Thanks.

## 2019-07-17 NOTE — Progress Notes (Signed)
Please let the patient know that Pap positive for yeast infection. Caused pap to be unsatisfactory for evaluation. Will need to repeat at next visit. Treated yeast infection with diflucan. Take one tablet one time. If there is vaginal discharge or discomfort, take second after three days. Sent to her pharmacy. Can you let beth know we need to change next appointment to pap and will need 30 minute visit. Thanks.

## 2019-07-19 NOTE — Telephone Encounter (Signed)
Pt advised on results and the need for pap repeat

## 2019-08-01 ENCOUNTER — Ambulatory Visit: Payer: BLUE CROSS/BLUE SHIELD | Admitting: Internal Medicine

## 2019-08-01 ENCOUNTER — Encounter: Payer: Self-pay | Admitting: Internal Medicine

## 2019-08-01 VITALS — BP 110/70 | HR 91 | Resp 16 | Ht 59.0 in | Wt 157.0 lb

## 2019-08-01 DIAGNOSIS — R0602 Shortness of breath: Secondary | ICD-10-CM | POA: Diagnosis not present

## 2019-08-01 DIAGNOSIS — Z9981 Dependence on supplemental oxygen: Secondary | ICD-10-CM | POA: Diagnosis not present

## 2019-08-01 DIAGNOSIS — I10 Essential (primary) hypertension: Secondary | ICD-10-CM

## 2019-08-01 DIAGNOSIS — D860 Sarcoidosis of lung: Secondary | ICD-10-CM | POA: Diagnosis not present

## 2019-08-01 DIAGNOSIS — J9611 Chronic respiratory failure with hypoxia: Secondary | ICD-10-CM | POA: Diagnosis not present

## 2019-08-01 NOTE — Progress Notes (Signed)
[facility] [address] [place], [place] [address]  Pulmonary Sleep Medicine   Office Visit Note  Patient Name: Marissa Luna DOB: Sep 02, 1956 MRN [id number]  Date of Service: 08/01/2019  Complaints/HPI: PT is here for follow up on sarcoidosis.  She had a 6 minute walk today that shows a drop in Oxygen saturation to 84% without oxygen.  She recovered well on 2 liters of oxygen, and passed her 6 minute walk with oxygen.  She will continue to use oxygen at this time. She continues to use Trelegy inhaler and denies issues.   ROS  General: (-) fever, (-) chills, (-) night sweats, (-) weakness Skin: (-) rashes, (-) itching,. Eyes: (-) visual changes, (-) redness, (-) itching. Nose and Sinuses: (-) nasal stuffiness or itchiness, (-) postnasal drip, (-) nosebleeds, (-) sinus trouble. Mouth and Throat: (-) sore throat, (-) hoarseness. Neck: (-) swollen glands, (-) enlarged thyroid, (-) neck pain. Respiratory: - cough, (-) bloody sputum, - shortness of breath, - wheezing. Cardiovascular: - ankle swelling, (-) chest pain. Lymphatic: (-) lymph node enlargement. Neurologic: (-) numbness, (-) tingling. Psychiatric: (-) anxiety, (-) depression   Current Medication: Outpatient Encounter Medications as of 08/01/2019  Medication Sig  . albuterol (PROVENTIL HFA;VENTOLIN HFA) 108 (90 Base) MCG/ACT inhaler INHALE 2 PUFFS 4 TIMES A DAY AS NEEDED FOR WHEEZING  . albuterol (PROVENTIL) (2.5 MG/3ML) 0.083% nebulizer solution Take 3 mLs (2.5 mg total) by nebulization every 6 (six) hours as needed.  . ALPRAZolam (XANAX) 0.25 MG tablet Take 1 tablet (0.25 mg total) by mouth 2 (two) times daily as needed for anxiety.  Marland Kitchen amLODipine (NORVASC) 10 MG tablet Take 1 tablet (10 mg total) by mouth daily.  . Calcium Carbonate-Vitamin D 600-400 MG-UNIT tablet Take by mouth.  . cetirizine (ZYRTEC) 10 MG tablet Take 10 mg by mouth.  . cyclobenzaprine (FLEXERIL) 5 MG tablet Take 1 tablet (5 mg total) by  mouth 3 (three) times daily as needed.  . fluticasone (FLONASE) 50 MCG/ACT nasal spray Place into the nose.  . folic acid (FOLVITE) 1 MG tablet Take 1 mg by mouth.  Marland Kitchen ibuprofen (ADVIL) 800 MG tablet Take 1 tablet (800 mg total) by mouth 3 (three) times daily.  . OXYGEN Inhale 2 L into the lungs.  . predniSONE (DELTASONE) 5 MG tablet Take 1 tablet (5 mg total) by mouth daily with breakfast.  . rOPINIRole (REQUIP) 0.25 MG tablet Take 1 tablet (0.25 mg total) by mouth at bedtime.  . TRELEGY ELLIPTA 100-62.5-25 MCG/INH AEPB Inhale 1 puff into the lungs daily.  . Vitamin D, Ergocalciferol, (DRISDOL) 1.25 MG (50000 UT) CAPS capsule Take 1 capsule (50,000 Units total) by mouth once a week.  . [DISCONTINUED] fluconazole (DIFLUCAN) 150 MG tablet Take 1 tablet po once. May repeat dose in 3 days as needed for persistent symptoms. (Patient not taking: Reported on 08/01/2019)  . [DISCONTINUED] pneumococcal 23 valent vaccine (PNEUMOVAX 23) 25 MCG/0.5ML injection Inject 0.29ml IM once (Patient not taking: Reported on 08/01/2019)  . [DISCONTINUED] triamcinolone ointment (KENALOG) 0.1 % Apply twice daily as needed to affected areas on body and face   No facility-administered encounter medications on file as of 08/01/2019.     Surgical History: Past Surgical History:  Procedure Laterality Date  . CHOLECYSTECTOMY    . COLONOSCOPY N/A 10/26/2015   Procedure: COLONOSCOPY;  Surgeon: Wallace Cullens, MD;  Location: [facility] ENDOSCOPY;  Service: Gastroenterology;  Laterality: N/A;    Medical History: Past Medical History:  Diagnosis Date  .  Asthma   . Environmental allergies   . Hypertension   . Sarcoidosis     Family History: Family History  Problem Relation Age of Onset  . Lung cancer Mother   . Colon cancer Father   . Diabetes Sister   . Breast cancer Sister     Social History: Social History   Socioeconomic History  . Marital status: Married    Spouse name: Not on file  . Number of children: Not on  file  . Years of education: Not on file  . Highest education level: Not on file  Occupational History  . Not on file  Social Needs  . Financial resource strain: Not on file  . Food insecurity    Worry: Not on file    Inability: Not on file  . Transportation needs    Medical: Not on file    Non-medical: Not on file  Tobacco Use  . Smoking status: Never Smoker  . Smokeless tobacco: Never Used  Substance and Sexual Activity  . Alcohol use: No  . Drug use: No  . Sexual activity: Not on file  Lifestyle  . Physical activity    Days per week: Not on file    Minutes per session: Not on file  . Stress: Not on file  Relationships  . Social Musicianconnections    Talks on phone: Not on file    Gets together: Not on file    Attends religious service: Not on file    Active member of club or organization: Not on file    Attends meetings of clubs or organizations: Not on file    Relationship status: Not on file  . Intimate partner violence    Fear of current or ex partner: Not on file    Emotionally abused: Not on file    Physically abused: Not on file    Forced sexual activity: Not on file  Other Topics Concern  . Not on file  Social History Narrative  . Not on file    Vital Signs: Blood pressure 110/70, pulse 91, resp. rate 16, height 4\' 11"  (1.499 m), weight 157 lb (71.2 kg), SpO2 95 %.  Examination: General Appearance: The patient is well-developed, well-nourished, and in no distress. Skin: Gross inspection of skin unremarkable. Head: normocephalic, no gross deformities. Eyes: no gross deformities noted. ENT: ears appear grossly normal no exudates. Neck: Supple. No thyromegaly. No LAD. Respiratory: clear bilaterally. Cardiovascular: Normal S1 and S2 without murmur or rub. Extremities: No cyanosis. pulses are equal. Neurologic: Alert and oriented. No involuntary movements.  LABS: Recent Results (from the past 2160 hour(s))  Urinalysis, Routine w reflex microscopic     Status:  None   Collection Time: 06/25/19  9:20 AM  Result Value Ref Range   Specific Gravity, UA 1.019 1.005 - 1.030   pH, UA 6.5 5.0 - 7.5   Color, UA Yellow Yellow   Appearance Ur Clear Clear   Leukocytes,UA Negative Negative   Protein,UA Negative Negative/Trace   Glucose, UA Negative Negative   Ketones, UA Negative Negative   RBC, UA Negative Negative   Bilirubin, UA Negative Negative   Urobilinogen, Ur 1.0 0.2 - 1.0 mg/dL   Nitrite, UA Negative Negative   Microscopic Examination Comment     Comment: Microscopic not indicated and not performed.  Pap IG and HPV (high risk) DNA detection     Status: None   Collection Time: 06/25/19  9:30 AM  Result Value Ref Range   Interpretation UNS  Comment: UNSATISFACTORY FOR EVALUATION.   Category UNS     Comment: Unsatisfactory   Infection FOC     Comment: FUNGAL ORGANISMS MORPHOLOGICALLY CONSISTENT WITH CANDIDA SPECIES ARE PRESENT.    Adequacy CTTB     Comment: Specimen processed and examined, but unsatisfactory for evaluation of epithelial abnormality because of excessively thick areas.    Clinician Provided ICD10 Comment     Comment: Z12.4   Performed by: Comment     Comment: Alwyn Ren, Cytotechnologist (ASCP)   QC reviewed by: Comment     Comment: Diannia Ruder, Supervisory Cytotechnologist (ASCP)   Note: Comment     Comment: The Pap smear is a screening test designed to aid in the detection of premalignant and malignant conditions of the uterine cervix.  It is not a diagnostic procedure and should not be used as the sole means of detecting cervical cancer.  Both false-positive and false-negative reports do occur.    Test Methodology CANCELED     Comment: The Thin Prep(R) Imager was unable to read this specimen.  Therefore a manual review was performed.  Result canceled by the ancillary.    HPV, high-risk CANCELED     Comment: The quantity of specimen remaining in the vial after Pap slide preparation was less than the 4 mL  minimum cell suspension required. Low sample cellularity may be the cause.  See HPV, low volume rfx test result. This nucleic acid amplification high-risk HPV test detects thirteen high-risk types (16,18,31,33,35,39,45,51,52,56,58,59,68) without differentiation.  Result canceled by the ancillary.   HPV, low volume (reflex)     Status: Abnormal   Collection Time: 06/25/19  9:30 AM  Result Value Ref Range   HPV low volume reflex Positive (A) Negative    Comment: This nucleic acid amplification test detects fourteen high-risk HPV types (16,18,31,33,35,39,45,51,52,56,58,59,66,68) without differentiation.     Radiology: Mm Diag Breast Tomo Uni Left  Result Date: 07/08/2019 CLINICAL DATA:  Patient was called back from screening mammogram for a possible asymmetry in the left breast. EXAM: DIGITAL DIAGNOSTIC LEFT MAMMOGRAM WITH TOMO COMPARISON:  Previous exam(s). ACR Breast Density Category b: There are scattered areas of fibroglandular density. FINDINGS: Additional imaging of the left breast was performed. No suspicious mass, malignant type microcalcifications or distortion detected. IMPRESSION: No evidence of malignancy in the left breast. RECOMMENDATION: Bilateral screening mammogram in 1 year is recommended. I have discussed the findings and recommendations with the patient. Results were also provided in writing at the conclusion of the visit. If applicable, a reminder letter will be sent to the patient regarding the next appointment. BI-RADS CATEGORY  1: Negative. Electronically Signed   By: Lillia Mountain M.D.   On: 07/08/2019 11:25    No results found.  Mm Diag Breast Tomo Uni Left  Result Date: 07/08/2019 CLINICAL DATA:  Patient was called back from screening mammogram for a possible asymmetry in the left breast. EXAM: DIGITAL DIAGNOSTIC LEFT MAMMOGRAM WITH TOMO COMPARISON:  Previous exam(s). ACR Breast Density Category b: There are scattered areas of fibroglandular density. FINDINGS:  Additional imaging of the left breast was performed. No suspicious mass, malignant type microcalcifications or distortion detected. IMPRESSION: No evidence of malignancy in the left breast. RECOMMENDATION: Bilateral screening mammogram in 1 year is recommended. I have discussed the findings and recommendations with the patient. Results were also provided in writing at the conclusion of the visit. If applicable, a reminder letter will be sent to the patient regarding the next appointment. BI-RADS CATEGORY  1: Negative. Electronically  Signed   By: Baird Lyonsina  Arceo M.D.   On: 07/08/2019 11:25      Assessment and Plan: Patient Active Problem List   Diagnosis Date Noted  . Pain in both lower extremities 06/30/2019  . Restless leg syndrome 12/20/2018  . Oxygen dependent 10/17/2018  . Other fatigue 10/17/2018  . Abnormal weight gain 10/17/2018  . Sarcoidosis   . Need for prophylactic vaccination against Streptococcus pneumoniae (pneumococcus) 07/11/2018  . Routine cervical smear 03/25/2018  . Dysuria 03/25/2018  . Chronic respiratory failure with hypoxia (HCC) 03/13/2018  . Acute bronchitis with COPD (HCC) 02/22/2018  . Lymphocytosis 02/22/2018  . Abnormal kidney function 02/22/2018  . Sepsis (HCC) 01/11/2018  . Community acquired pneumonia of left lung 01/11/2018  . Influenza A 01/11/2018  . 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 Hypoxemia 01/08/2018  . Allergic rhinitis, unspecified 01/08/2018  . Essential hypertension 01/08/2018  . 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 Shortness of breath 01/08/2018    1. Sarcoidosis of lung (HCC) Pt will continue to use Trelegy inhaler and oxygen as directed.   2. Chronic respiratory failure with hypoxia (HCC) Stable, using oxygen.   3. Oxygen dependent Continue using oxygen continuously through out the day and night.    4. Essential hypertension Stable, continue present management.   5. SOB (shortness of breath) - Spirometry with Graph - 6 minute  walk  General Counseling: I have discussed the findings of the evaluation and examination with Kataleena.  I have also discussed any further diagnostic evaluation thatmay be needed or ordered today. Meriem verbalizes understanding of the findings of todays visit. We also reviewed her medications today and discussed drug interactions and side effects including but not limited excessive drowsiness and altered mental states. We also discussed that there is always a risk not just to her but also people around her. she has been encouraged to call the office with any questions or concerns that should arise related to todays visit.    Time spent: 15 This patient was seen by Blima LedgerAdam Ramie Palladino AGNP-C in Collaboration with Dr. Freda MunroSaadat Khan as a part of collaborative care agreement.   I have personally obtained a history, examined the patient, evaluated laboratory and imaging results, formulated the assessment and plan and placed orders.    Yevonne PaxSaadat A Khan, MD [facility] Pulmonary and Critical [facility] Sleep medicine

## 2019-08-09 ENCOUNTER — Other Ambulatory Visit: Payer: Self-pay | Admitting: Adult Health

## 2019-08-09 DIAGNOSIS — D860 Sarcoidosis of lung: Secondary | ICD-10-CM

## 2019-09-17 ENCOUNTER — Telehealth: Payer: Self-pay

## 2019-09-17 NOTE — Telephone Encounter (Signed)
Mailed patient disability papers and records to  [employer]   C/O [address] Claims [address] [place] [address] [phone]

## 2019-09-25 DIAGNOSIS — Z0289 Encounter for other administrative examinations: Secondary | ICD-10-CM

## 2019-10-03 ENCOUNTER — Telehealth: Payer: Self-pay

## 2019-10-03 NOTE — Telephone Encounter (Signed)
ORDER SIGNED AND PLACED IN [employer] PATIENT FOLDER. °

## 2019-10-07 IMAGING — CR DG CHEST 2V
2 series · 2 of 2 positions shown · non-contrast
Comparison: 02/11/2016, 01/28/2015, CT chest 04/22/2010

CLINICAL DATA: Cough and fever

EXAM:
CHEST  2 VIEW

[chest pa]
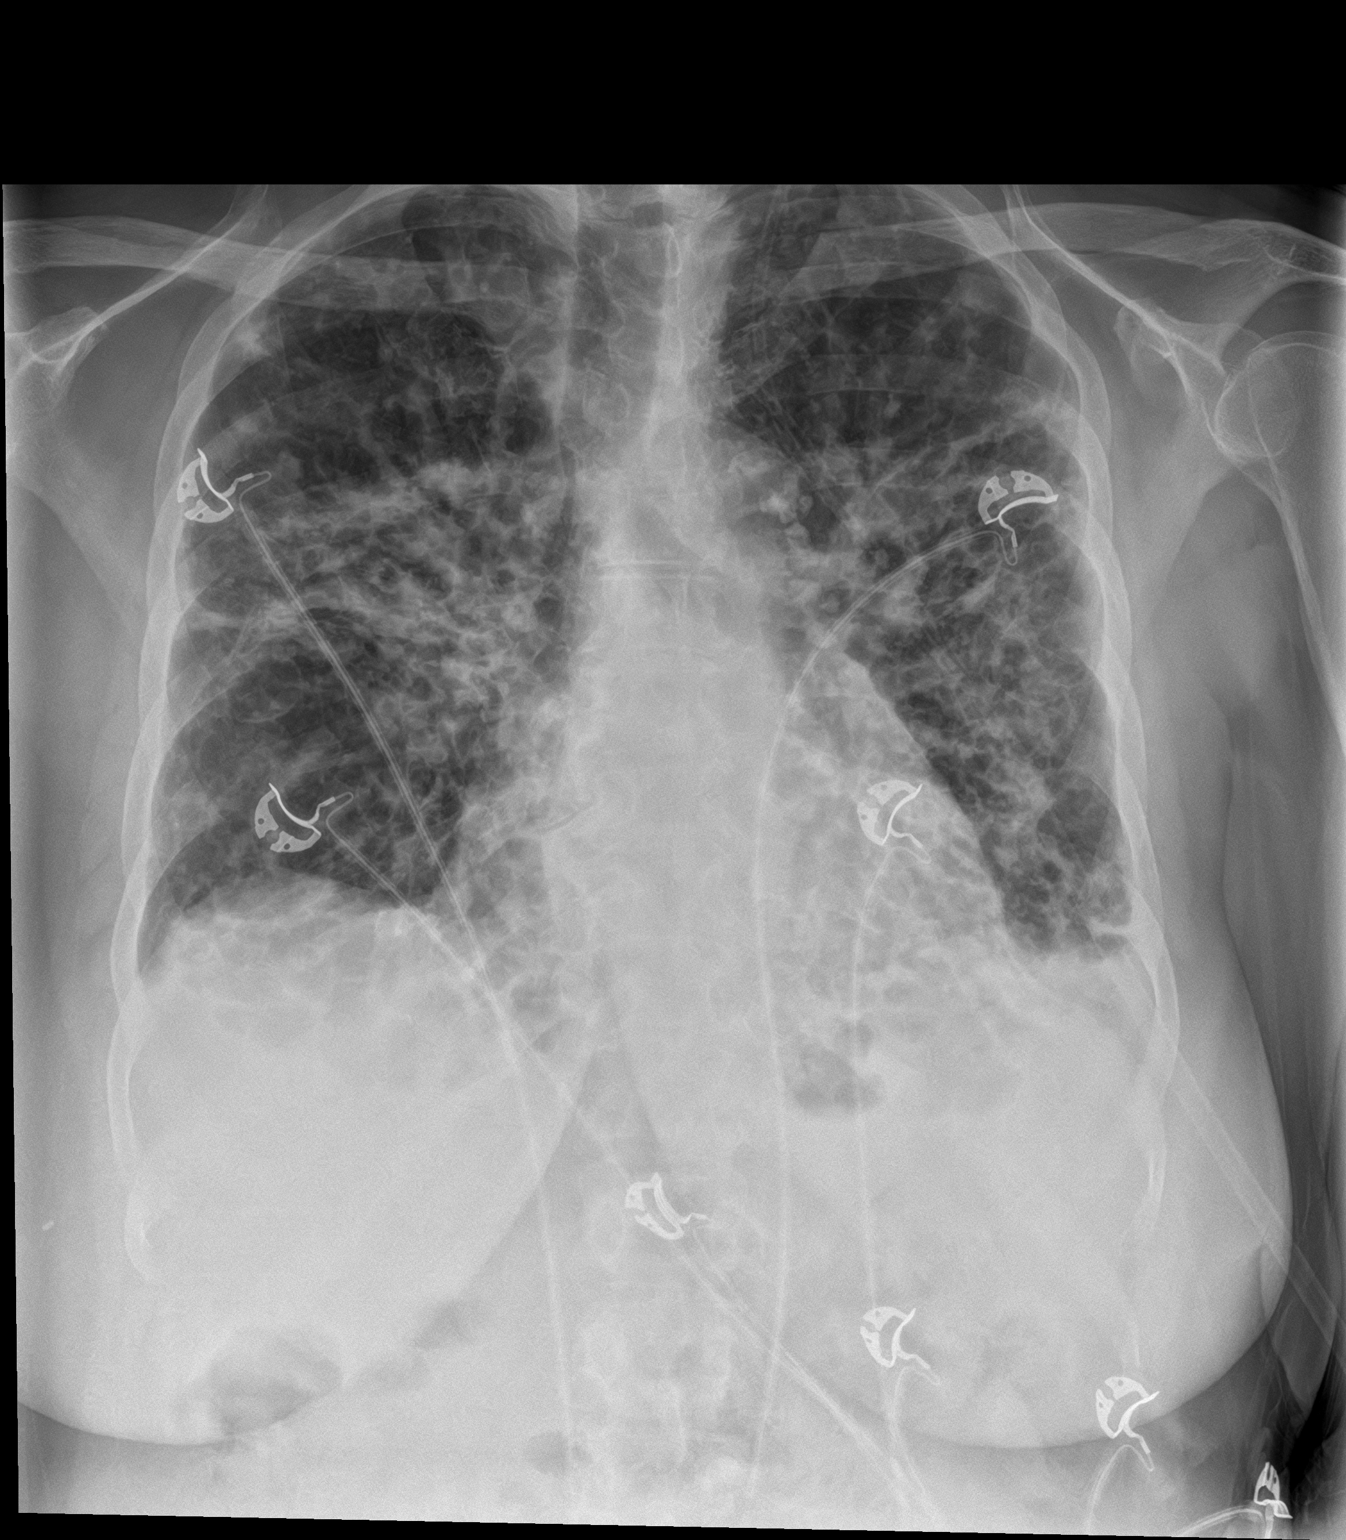

[chest lat]
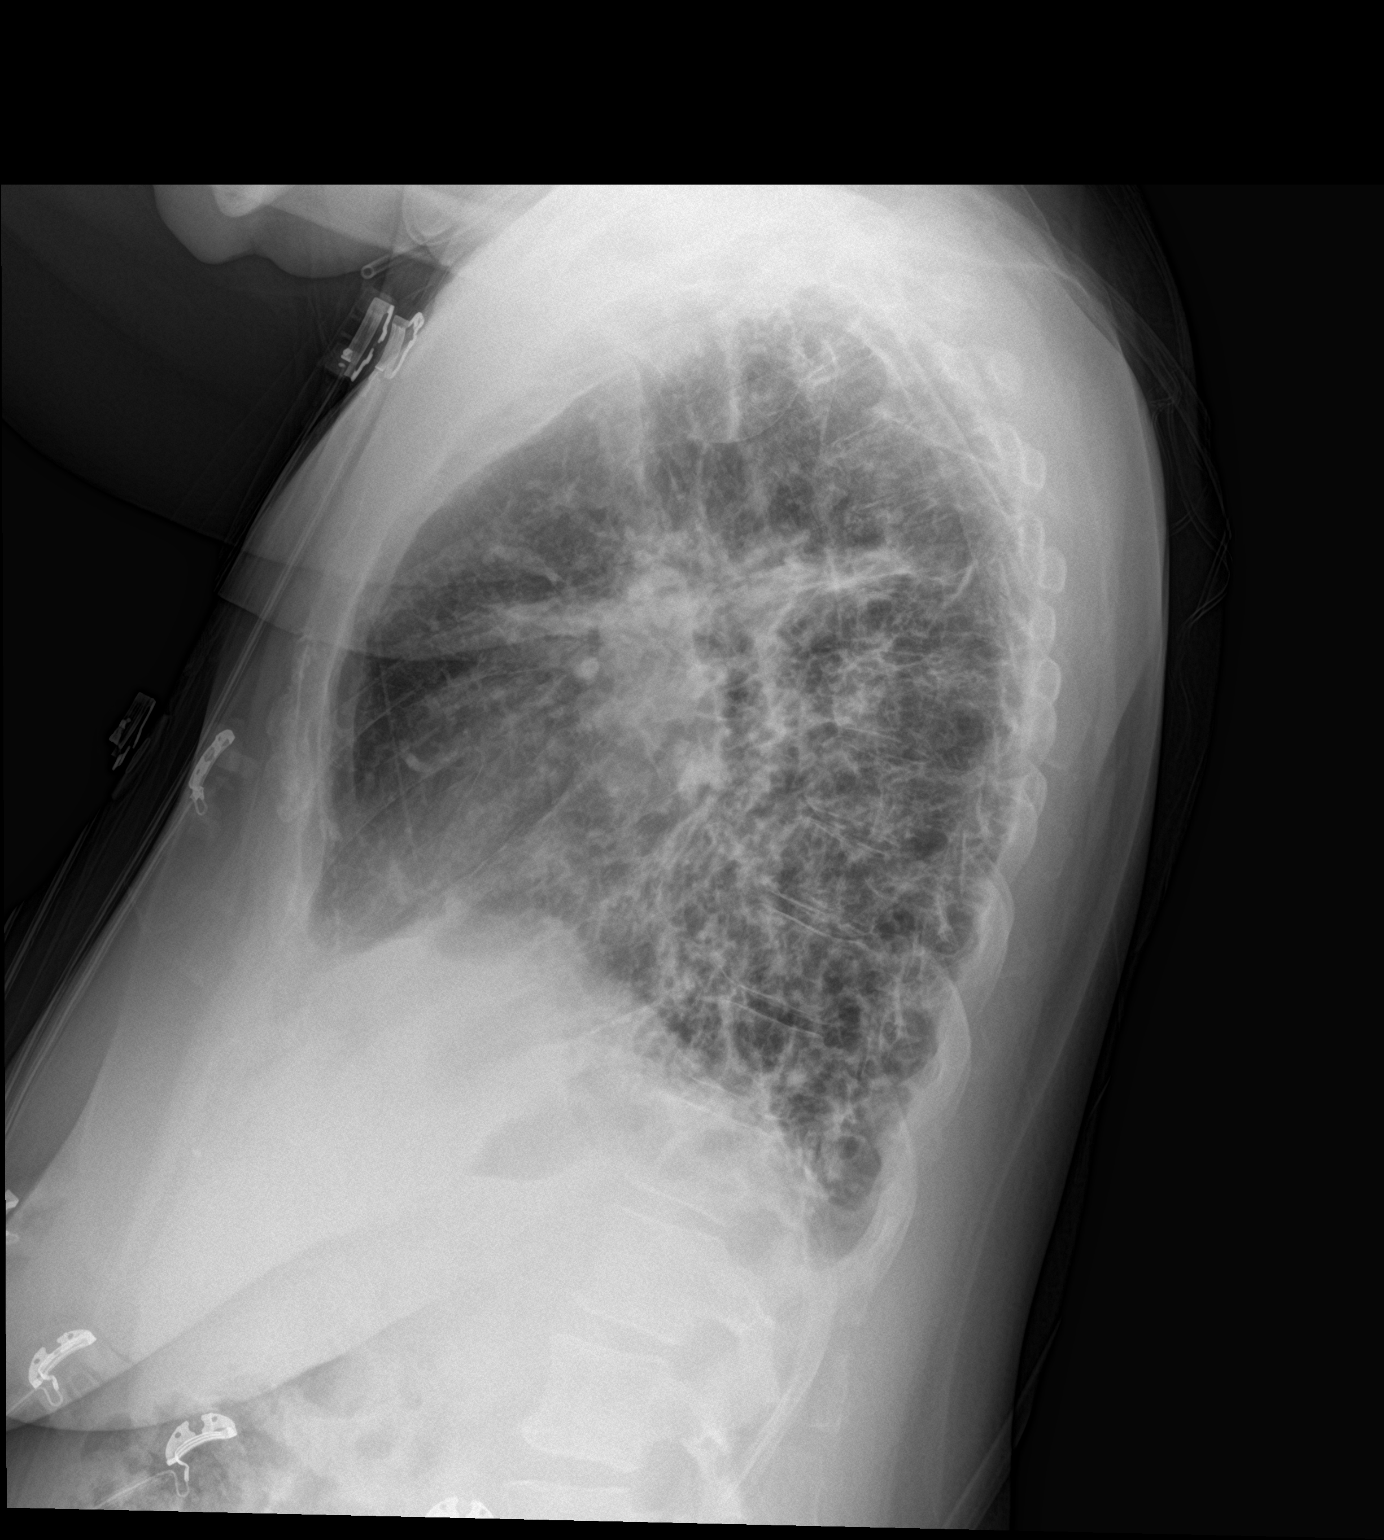

[2 of 2 positions shown; findings below may reference images not displayed]

FINDINGS: Increased bilateral reticular opacity since prior radiograph. No
focal consolidation or significant effusion. Stable
cardiomediastinal silhouette. No pneumothorax.
IMPRESSION: 1. Increased diffuse bilateral reticular opacity, could reflect
acute infection or inflammation superimposed on chronic pulmonary
fibrosis versus progression of fibrotic lung disease.

## 2019-10-08 ENCOUNTER — Other Ambulatory Visit: Payer: Self-pay | Admitting: Nurse Practitioner

## 2019-10-08 MED ORDER — AMLODIPINE BESYLATE 10 MG PO TABS: 10.0000 mg | ORAL_TABLET | Freq: Every day | ORAL | 1 refills | Status: AC

## 2019-10-08 MED ORDER — ALBUTEROL SULFATE HFA 108 (90 BASE) MCG/ACT IN AERS: INHALATION_SPRAY | RESPIRATORY_TRACT | 2 refills | Status: AC

## 2019-10-10 ENCOUNTER — Other Ambulatory Visit: Payer: Self-pay

## 2019-10-10 DIAGNOSIS — E559 Vitamin D deficiency, unspecified: Secondary | ICD-10-CM

## 2019-10-10 MED ORDER — VITAMIN D (ERGOCALCIFEROL) 1.25 MG (50000 UNIT) PO CAPS: 50000.0000 [IU] | ORAL_CAPSULE | ORAL | 5 refills | Status: AC

## 2019-10-24 ENCOUNTER — Telehealth: Payer: Self-pay

## 2019-10-24 NOTE — Telephone Encounter (Signed)
CONFIRMED AND SCREENED FOR COVID FOR 10-28-19 OV. °

## 2019-10-28 ENCOUNTER — Encounter: Payer: Self-pay | Admitting: Nurse Practitioner

## 2019-10-28 ENCOUNTER — Ambulatory Visit (INDEPENDENT_AMBULATORY_CARE_PROVIDER_SITE_OTHER): Payer: BLUE CROSS/BLUE SHIELD | Admitting: Nurse Practitioner

## 2019-10-28 ENCOUNTER — Other Ambulatory Visit: Payer: Self-pay

## 2019-10-28 VITALS — BP 138/78 | HR 94 | Temp 97.5°F | Resp 16 | Ht 59.0 in | Wt 161.6 lb

## 2019-10-28 DIAGNOSIS — I10 Essential (primary) hypertension: Secondary | ICD-10-CM | POA: Diagnosis not present

## 2019-10-28 DIAGNOSIS — R87615 Unsatisfactory cytologic smear of cervix: Secondary | ICD-10-CM | POA: Diagnosis not present

## 2019-10-28 DIAGNOSIS — Z124 Encounter for screening for malignant neoplasm of cervix: Secondary | ICD-10-CM

## 2019-10-28 NOTE — Progress Notes (Signed)
[facility] [address] [place], [place] [address]  Internal MEDICINE  Office Visit Note  Patient Name: Marissa MiyamotoKatie S Luna  [id number]  [id number]  Date of Service: 10/28/2019   Chief Complaint  Patient presents with  . Hypertension  . Gynecologic Exam     The patient is here for routine follow up. She did have abnormal pap smear at her most recent physical. She had significant yeast infection making the pap unsatisfactory to evaluate.  The pap was also HPV positive. The yeast infection was successfully treated. She has no complaint of vaginal itching or irritation. She has noted no excess vaginal discharge. She is here to have repeat pap smear. She has no new concerns or complaints.   Pt is here for a pap smear   Current Medication: Outpatient Encounter Medications as of 10/28/2019  Medication Sig  . albuterol (PROVENTIL) (2.5 MG/3ML) 0.083% nebulizer solution Take 3 mLs (2.5 mg total) by nebulization every 6 (six) hours as needed.  Marland Kitchen. albuterol (VENTOLIN HFA) 108 (90 Base) MCG/ACT inhaler INHALE 2 PUFFS 4 TIMES A DAY AS NEEDED FOR WHEEZING  . ALPRAZolam (XANAX) 0.25 MG tablet Take 1 tablet (0.25 mg total) by mouth 2 (two) times daily as needed for anxiety.  Marland Kitchen. amLODipine (NORVASC) 10 MG tablet Take 1 tablet (10 mg total) by mouth daily.  . Calcium Carbonate-Vitamin D 600-400 MG-UNIT tablet Take by mouth.  . cetirizine (ZYRTEC) 10 MG tablet Take 10 mg by mouth.  . cyclobenzaprine (FLEXERIL) 5 MG tablet Take 1 tablet (5 mg total) by mouth 3 (three) times daily as needed.  . fluticasone (FLONASE) 50 MCG/ACT nasal spray Place into the nose.  . folic acid (FOLVITE) 1 MG tablet Take 1 mg by mouth.  Marland Kitchen. ibuprofen (ADVIL) 800 MG tablet Take 1 tablet (800 mg total) by mouth 3 (three) times daily.  . OXYGEN Inhale 2 L into the lungs.  . predniSONE (DELTASONE) 5 MG tablet TAKE 1 TABLET BY MOUTH EVERY DAY WITH BREAKFAST  . rOPINIRole (REQUIP) 0.25 MG tablet Take 1 tablet (0.25 mg  total) by mouth at bedtime.  . TRELEGY ELLIPTA 100-62.5-25 MCG/INH AEPB Inhale 1 puff into the lungs daily.  . Vitamin D, Ergocalciferol, (DRISDOL) 1.25 MG (50000 UT) CAPS capsule Take 1 capsule (50,000 Units total) by mouth once a week.   No facility-administered encounter medications on file as of 10/28/2019.     Surgical History: Past Surgical History:  Procedure Laterality Date  . CHOLECYSTECTOMY    . COLONOSCOPY N/A 10/26/2015   Procedure: COLONOSCOPY;  Surgeon: Wallace CullensPaul Y Oh, MD;  [facility];  Service: Gastroenterology;  Laterality: N/A;    Medical History: Past Medical History:  Diagnosis Date  . Asthma   . Environmental allergies   . Hypertension   . Sarcoidosis     Family History: Family History  Problem Relation Age of Onset  . Lung cancer Mother   . Colon cancer Father   . Diabetes Sister   . Breast cancer Sister     Social History   Socioeconomic History  . Marital status: Married    Spouse name: Not on file  . Number of children: Not on file  . Years of education: Not on file  . Highest education level: Not on file  Occupational History  . Not on file  Social Needs  . Financial resource strain: Not on file  . Food insecurity    Worry: Not on file    Inability: Not on file  .  Transportation needs    Medical: Not on file    Non-medical: Not on file  Tobacco Use  . Smoking status: Never Smoker  . Smokeless tobacco: Never Used  Substance and Sexual Activity  . Alcohol use: No  . Drug use: No  . Sexual activity: Not on file  Lifestyle  . Physical activity    Days per week: Not on file    Minutes per session: Not on file  . Stress: Not on file  Relationships  . Social Musician on phone: Not on file    Gets together: Not on file    Attends religious service: Not on file    Active member of club or organization: Not on file    Attends meetings of clubs or organizations: Not on file    Relationship status: Not on file  .  Intimate partner violence    Fear of current or ex partner: Not on file    Emotionally abused: Not on file    Physically abused: Not on file    Forced sexual activity: Not on file  Other Topics Concern  . Not on file  Social History Narrative  . Not on file     Review of Systems  Constitutional: Negative for activity change, chills, fatigue and unexpected weight change.  HENT: Negative for congestion, postnasal drip, rhinorrhea, sneezing and sore throat.   Respiratory: Negative for cough, chest tightness, shortness of breath and wheezing.   Cardiovascular: Negative for chest pain and palpitations.  Gastrointestinal: Negative for abdominal pain, constipation, diarrhea, nausea and vomiting.  Endocrine: Negative for cold intolerance, heat intolerance, polydipsia and polyuria.  Genitourinary: Negative for dysuria, frequency, urgency and vaginal discharge.       Abnormal pap at last visit. Treated for yeast infection.   Musculoskeletal: Negative for arthralgias, back pain, joint swelling, myalgias and neck pain.  Skin: Negative for rash.  Allergic/Immunologic: Negative for environmental allergies.  Neurological: Negative for dizziness, tremors, numbness and headaches.  Hematological: Negative for adenopathy. Does not bruise/bleed easily.  Psychiatric/Behavioral: Negative for behavioral problems (Depression), sleep disturbance and suicidal ideas. The patient is not nervous/anxious.      Today's Vitals   10/28/19 1033  BP: 138/78  Pulse: 94  Resp: 16  Temp: (!) 97.5 F (36.4 C)  SpO2: 96%  Weight: 161 lb 9.6 oz (73.3 kg)  Height: 4\' 11"  (1.499 m)   Body mass index is 32.64 kg/m.  Physical Exam Vitals signs and nursing note reviewed.  Constitutional:      General: She is not in acute distress.    Appearance: Normal appearance. She is well-developed. She is not diaphoretic.  HENT:     Head: Normocephalic and atraumatic.     Nose: Nose normal.     Mouth/Throat:     Pharynx:  No oropharyngeal exudate.  Eyes:     Pupils: Pupils are equal, round, and reactive to light.  Neck:     Musculoskeletal: Normal range of motion and neck supple.     Thyroid: No thyromegaly.     Vascular: No JVD.     Trachea: No tracheal deviation.  Cardiovascular:     Rate and Rhythm: Normal rate and regular rhythm.     Heart sounds: Normal heart sounds. No murmur. No friction rub. No gallop.   Pulmonary:     Effort: Pulmonary effort is normal. No respiratory distress.     Breath sounds: Normal breath sounds. No wheezing or rales.  Chest:  Chest wall: No tenderness.  Abdominal:     Palpations: Abdomen is soft.  Genitourinary:    General: Normal vulva.     Exam position: Supine.     Labia:        Right: No rash or tenderness.        Left: No rash or tenderness.      Vagina: Normal. No vaginal discharge, erythema, tenderness or bleeding.     Cervix: No friability or erythema.     Uterus: Normal. Not enlarged.      Adnexa: Right adnexa normal and left adnexa normal.  Musculoskeletal: Normal range of motion.  Lymphadenopathy:     Cervical: No cervical adenopathy.     Lower Body: No right inguinal adenopathy. No left inguinal adenopathy.  Skin:    General: Skin is warm and dry.  Neurological:     Mental Status: She is alert and oriented to person, place, and time.     Cranial Nerves: No cranial nerve deficit.  Psychiatric:        Behavior: Behavior normal.        Thought Content: Thought content normal.        Judgment: Judgment normal.   Assessment/Plan: 1. Essential hypertension Stable. Continue bp medications as prescribed   2. Routine cervical smear - Pap IG and HPV (high risk) DNA detection  3. Unsatisfactory cervical Papanicolaou smear Most recent pap smear unsatisfactory due to significant yeast infection. Was treated effectively. New pap smear obtained today with HPV screen.   General Counseling: Sarabella verbalizes understanding of the findings of todays visit  and agrees with plan of treatment. I have discussed any further diagnostic evaluation that may be needed or ordered today. We also reviewed her medications today. she has been encouraged to call the office with any questions or concerns that should arise related to todays visit.    Counseling:  This patient was seen by Leretha Pol FNP Collaboration with Dr Lavera Guise as a part of collaborative care agreement   Time spent: 25 Minutes

## 2019-11-06 ENCOUNTER — Telehealth: Payer: Self-pay

## 2019-11-06 NOTE — Telephone Encounter (Signed)
Patient called requesting appt for arm pain and no injury so I have advised pt to call EMT or get seen by ER or urgent care if she starts having chest pain or worsening pains. Marissa Luna

## 2019-11-07 ENCOUNTER — Telehealth: Payer: Self-pay

## 2019-11-07 ENCOUNTER — Ambulatory Visit: Payer: BLUE CROSS/BLUE SHIELD | Admitting: Internal Medicine

## 2019-11-07 LAB — PAP IG AND HPV HIGH-RISK: HPV, high-risk: NEGATIVE

## 2019-11-07 NOTE — Telephone Encounter (Signed)
Pt was notified.  

## 2019-11-07 NOTE — Progress Notes (Signed)
Please let the patient know that her pap smear is normal. Thanks.

## 2019-11-07 NOTE — Telephone Encounter (Signed)
-----   Message from Ronnell Freshwater, NP sent at 11/07/2019  9:04 AM EST ----- Please let the patient know that her pap smear is normal. Thanks.

## 2019-11-30 ENCOUNTER — Inpatient Hospital Stay
Admission: EM | Admit: 2019-11-30 | Discharge: 2019-12-04 | DRG: 177 | Disposition: A | Discharge status: Home / Self Care | Payer: BLUE CROSS/BLUE SHIELD | Attending: Internal Medicine | Admitting: Internal Medicine

## 2019-11-30 ENCOUNTER — Encounter: Payer: Self-pay | Admitting: Emergency Medicine

## 2019-11-30 ENCOUNTER — Other Ambulatory Visit: Payer: Self-pay

## 2019-11-30 ENCOUNTER — Emergency Department: Payer: BLUE CROSS/BLUE SHIELD

## 2019-11-30 DIAGNOSIS — U071 COVID-19: Principal | ICD-10-CM

## 2019-11-30 DIAGNOSIS — J1289 Other viral pneumonia: Secondary | ICD-10-CM | POA: Diagnosis not present

## 2019-11-30 DIAGNOSIS — N179 Acute kidney failure, unspecified: Secondary | ICD-10-CM | POA: Diagnosis present

## 2019-11-30 DIAGNOSIS — J9621 Acute and chronic respiratory failure with hypoxia: Secondary | ICD-10-CM | POA: Diagnosis present

## 2019-11-30 DIAGNOSIS — Z9981 Dependence on supplemental oxygen: Secondary | ICD-10-CM | POA: Diagnosis not present

## 2019-11-30 DIAGNOSIS — Z79899 Other long term (current) drug therapy: Secondary | ICD-10-CM | POA: Diagnosis not present

## 2019-11-30 DIAGNOSIS — Z7952 Long term (current) use of systemic steroids: Secondary | ICD-10-CM

## 2019-11-30 DIAGNOSIS — R0902 Hypoxemia: Secondary | ICD-10-CM

## 2019-11-30 DIAGNOSIS — J440 Chronic obstructive pulmonary disease with acute lower respiratory infection: Secondary | ICD-10-CM | POA: Diagnosis present

## 2019-11-30 DIAGNOSIS — I10 Essential (primary) hypertension: Secondary | ICD-10-CM

## 2019-11-30 DIAGNOSIS — D869 Sarcoidosis, unspecified: Secondary | ICD-10-CM | POA: Diagnosis present

## 2019-11-30 DIAGNOSIS — R0602 Shortness of breath: Secondary | ICD-10-CM

## 2019-11-30 LAB — CBC WITH DIFFERENTIAL/PLATELET
Abs Immature Granulocytes: 0.02 10*3/uL (ref 0.00–0.07)
Basophils Absolute: 0 10*3/uL (ref 0.0–0.1)
Basophils Relative: 1 %
Eosinophils Absolute: 0.3 10*3/uL (ref 0.0–0.5)
Eosinophils Relative: 6 %
HCT: 41 % (ref 36.0–46.0)
Hemoglobin: 13 g/dL (ref 12.0–15.0)
Immature Granulocytes: 0 %
Lymphocytes Relative: 16 %
Lymphs Abs: 0.9 10*3/uL (ref 0.7–4.0)
MCH: 28.4 pg (ref 26.0–34.0)
MCHC: 31.7 g/dL (ref 30.0–36.0)
MCV: 89.5 fL (ref 80.0–100.0)
Monocytes Absolute: 0.8 10*3/uL (ref 0.1–1.0)
Monocytes Relative: 14 %
Neutro Abs: 3.4 10*3/uL (ref 1.7–7.7)
Neutrophils Relative %: 63 %
Platelets: 200 10*3/uL (ref 150–400)
RBC: 4.58 MIL/uL (ref 3.87–5.11)
RDW: 13.6 % (ref 11.5–15.5)
WBC: 5.4 10*3/uL (ref 4.0–10.5)
nRBC: 0 % (ref 0.0–0.2)

## 2019-11-30 LAB — TROPONIN I (HIGH SENSITIVITY)
Troponin I (High Sensitivity): 3 ng/L (ref <18)
Troponin I (High Sensitivity): 3 ng/L (ref <18)

## 2019-11-30 LAB — HEPATIC FUNCTION PANEL
ALT: 15 U/L (ref 0–44)
AST: 20 U/L (ref 15–41)
Albumin: 3.7 g/dL (ref 3.5–5.0)
Alkaline Phosphatase: 65 U/L (ref 38–126)
Bilirubin, Direct: 0.1 mg/dL (ref 0.0–0.2)
Indirect Bilirubin: 0.3 mg/dL (ref 0.3–0.9)
Total Bilirubin: 0.4 mg/dL (ref 0.3–1.2)
Total Protein: 7.2 g/dL (ref 6.5–8.1)

## 2019-11-30 LAB — FIBRIN DERIVATIVES D-DIMER (ARMC ONLY): Fibrin derivatives D-dimer (ARMC): 678.63 ng/mL (FEU) — ABNORMAL HIGH (ref 0.00–499.00)

## 2019-11-30 LAB — PROCALCITONIN: Procalcitonin: <0.1 ng/mL

## 2019-11-30 LAB — BASIC METABOLIC PANEL
Anion gap: 9 (ref 5–15)
BUN: 24 mg/dL — ABNORMAL HIGH (ref 8–23)
CO2: 28 mmol/L (ref 22–32)
Calcium: 8.7 mg/dL — ABNORMAL LOW (ref 8.9–10.3)
Chloride: 107 mmol/L (ref 98–111)
Creatinine, Ser: 1.07 mg/dL — ABNORMAL HIGH (ref 0.44–1.00)
GFR calc Af Amer: >60 mL/min (ref >60)
GFR calc non Af Amer: 55 mL/min — ABNORMAL LOW (ref >60)
Glucose, Bld: 107 mg/dL — ABNORMAL HIGH (ref 70–99)
Potassium: 4.1 mmol/L (ref 3.5–5.1)
Sodium: 144 mmol/L (ref 135–145)

## 2019-11-30 LAB — ABO/RH: ABO/RH(D): A POS

## 2019-11-30 LAB — FERRITIN: Ferritin: 130 ng/mL (ref 11–307)

## 2019-11-30 LAB — FIBRINOGEN: Fibrinogen: 412 mg/dL (ref 210–475)

## 2019-11-30 LAB — POC SARS CORONAVIRUS 2 AG: SARS Coronavirus 2 Ag: POSITIVE — AB

## 2019-11-30 MED ORDER — SODIUM CHLORIDE 0.9 % IV SOLN: INTRAVENOUS | Status: AC

## 2019-11-30 MED ORDER — DEXAMETHASONE SODIUM PHOSPHATE 10 MG/ML IJ SOLN
6.0000 mg | Freq: Every day | INTRAMUSCULAR | Status: DC
  Administered 2019-12-01 – 2019-12-04 (×4): 6 mg via INTRAVENOUS
  Filled 2019-11-30 (×4): qty 0.6

## 2019-11-30 MED ORDER — ROPINIROLE HCL 0.25 MG PO TABS
0.2500 mg | ORAL_TABLET | Freq: Every day | ORAL | Status: DC
  Administered 2019-11-30 – 2019-12-03 (×4): 0.25 mg via ORAL
  Filled 2019-11-30 (×5): qty 1

## 2019-11-30 MED ORDER — ALBUTEROL SULFATE HFA 108 (90 BASE) MCG/ACT IN AERS
1.0000 | INHALATION_SPRAY | RESPIRATORY_TRACT | Status: DC | PRN
  Filled 2019-11-30: qty 6.7

## 2019-11-30 MED ORDER — SODIUM CHLORIDE 0.9% FLUSH
3.0000 mL | Freq: Two times a day (BID) | INTRAVENOUS | Status: DC
  Administered 2019-12-01 – 2019-12-04 (×7): 3 mL via INTRAVENOUS

## 2019-11-30 MED ORDER — SODIUM CHLORIDE 0.9% FLUSH
3.0000 mL | Freq: Two times a day (BID) | INTRAVENOUS | Status: DC
  Administered 2019-11-30: 3 mL via INTRAVENOUS

## 2019-11-30 MED ORDER — POLYETHYLENE GLYCOL 3350 17 G PO PACK
17.0000 g | PACK | Freq: Every day | ORAL | Status: DC | PRN
  Filled 2019-11-30: qty 1

## 2019-11-30 MED ORDER — AMLODIPINE BESYLATE 10 MG PO TABS
10.0000 mg | ORAL_TABLET | Freq: Every day | ORAL | Status: DC
  Administered 2019-12-01 – 2019-12-04 (×4): 10 mg via ORAL
  Filled 2019-11-30 (×4): qty 1

## 2019-11-30 MED ORDER — FOLIC ACID 1 MG PO TABS
1.0000 mg | ORAL_TABLET | Freq: Every day | ORAL | Status: DC
  Administered 2019-12-01 – 2019-12-04 (×4): 1 mg via ORAL
  Filled 2019-11-30 (×5): qty 1

## 2019-11-30 MED ORDER — CALCIUM CARBONATE-VITAMIN D 500-200 MG-UNIT PO TABS
1.0000 | ORAL_TABLET | Freq: Every day | ORAL | Status: DC
  Administered 2019-12-01 – 2019-12-04 (×4): 1 via ORAL
  Filled 2019-11-30 (×5): qty 1

## 2019-11-30 MED ORDER — ONDANSETRON HCL 4 MG PO TABS
4.0000 mg | ORAL_TABLET | Freq: Four times a day (QID) | ORAL | Status: DC | PRN
  Filled 2019-11-30: qty 1

## 2019-11-30 MED ORDER — SODIUM CHLORIDE 0.9 % IV SOLN
100.0000 mg | Freq: Every day | INTRAVENOUS | Status: AC
  Administered 2019-12-01 – 2019-12-04 (×4): 100 mg via INTRAVENOUS
  Filled 2019-11-30 (×4): qty 100

## 2019-11-30 MED ORDER — SODIUM CHLORIDE 0.9 % IV SOLN
200.0000 mg | Freq: Once | INTRAVENOUS | Status: AC
  Administered 2019-11-30: 200 mg via INTRAVENOUS
  Filled 2019-11-30: qty 200

## 2019-11-30 MED ORDER — UMECLIDINIUM-VILANTEROL 62.5-25 MCG/INH IN AEPB
1.0000 | INHALATION_SPRAY | Freq: Every day | RESPIRATORY_TRACT | Status: DC
  Administered 2019-12-01 – 2019-12-04 (×4): 1 via RESPIRATORY_TRACT
  Filled 2019-11-30 (×2): qty 14

## 2019-11-30 MED ORDER — ALPRAZOLAM 0.25 MG PO TABS: 0.2500 mg | ORAL_TABLET | Freq: Two times a day (BID) | ORAL | Status: DC | PRN

## 2019-11-30 MED ORDER — VITAMIN D (ERGOCALCIFEROL) 1.25 MG (50000 UNIT) PO CAPS
50000.0000 [IU] | ORAL_CAPSULE | ORAL | Status: DC
  Administered 2019-12-01: 50000 [IU] via ORAL
  Filled 2019-11-30 (×2): qty 1

## 2019-11-30 MED ORDER — FLUTICASONE-UMECLIDIN-VILANT 100-62.5-25 MCG/INH IN AEPB: 1.0000 | INHALATION_SPRAY | Freq: Every day | RESPIRATORY_TRACT | Status: DC

## 2019-11-30 MED ORDER — LORATADINE 10 MG PO TABS
10.0000 mg | ORAL_TABLET | Freq: Every day | ORAL | Status: DC
  Administered 2019-12-01 – 2019-12-04 (×4): 10 mg via ORAL
  Filled 2019-11-30 (×5): qty 1

## 2019-11-30 MED ORDER — ONDANSETRON HCL 4 MG/2ML IJ SOLN: 4.0000 mg | Freq: Four times a day (QID) | INTRAMUSCULAR | Status: DC | PRN

## 2019-11-30 MED ORDER — GUAIFENESIN-DM 100-10 MG/5ML PO SYRP
10.0000 mL | ORAL_SOLUTION | ORAL | Status: DC | PRN
  Filled 2019-11-30 (×2): qty 10

## 2019-11-30 MED ORDER — HYDROCOD POLST-CPM POLST ER 10-8 MG/5ML PO SUER
5.0000 mL | Freq: Two times a day (BID) | ORAL | Status: DC | PRN
  Administered 2019-12-04: 5 mL via ORAL
  Filled 2019-11-30: qty 5

## 2019-11-30 MED ORDER — ACETAMINOPHEN 325 MG PO TABS: 650.0000 mg | ORAL_TABLET | Freq: Four times a day (QID) | ORAL | Status: DC | PRN

## 2019-11-30 MED ORDER — FLUTICASONE PROPIONATE 50 MCG/ACT NA SUSP
1.0000 | Freq: Every day | NASAL | Status: DC
  Administered 2019-12-01 – 2019-12-04 (×4): 1 via NASAL
  Filled 2019-11-30 (×2): qty 16

## 2019-11-30 MED ORDER — ASCORBIC ACID 500 MG PO TABS
500.0000 mg | ORAL_TABLET | Freq: Every day | ORAL | Status: DC
  Administered 2019-12-01 – 2019-12-04 (×4): 500 mg via ORAL
  Filled 2019-11-30 (×4): qty 1

## 2019-11-30 MED ORDER — ZINC SULFATE 220 (50 ZN) MG PO CAPS
220.0000 mg | ORAL_CAPSULE | Freq: Every day | ORAL | Status: DC
  Administered 2019-12-01 – 2019-12-04 (×4): 220 mg via ORAL
  Filled 2019-11-30 (×4): qty 1

## 2019-11-30 MED ORDER — IPRATROPIUM-ALBUTEROL 20-100 MCG/ACT IN AERS
1.0000 | INHALATION_SPRAY | Freq: Four times a day (QID) | RESPIRATORY_TRACT | Status: DC
  Administered 2019-11-30 – 2019-12-04 (×15): 1 via RESPIRATORY_TRACT
  Filled 2019-11-30 (×2): qty 4

## 2019-11-30 MED ORDER — DEXAMETHASONE SODIUM PHOSPHATE 10 MG/ML IJ SOLN
6.0000 mg | Freq: Once | INTRAMUSCULAR | Status: AC
  Administered 2019-11-30: 6 mg via INTRAVENOUS
  Filled 2019-11-30: qty 1

## 2019-11-30 NOTE — ED Notes (Signed)
Attempted to call report, was told RN would call back in 10 minutes after looking over the chart

## 2019-11-30 NOTE — Consult Note (Signed)
Remdesivir - Pharmacy Brief Note   O:  ALT: 15 CXR:  1. Patchy opacities are identified throughout bilateral lungs suspicious for pneumonia. 2. 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 SpO2: 98% on [place] 2 L/min   A/P:  Remdesivir 200 mg IVPB once followed by 100 mg IVPB daily x 4 days.   .me 11/30/2019 1:47 PM

## 2019-11-30 NOTE — ED Triage Notes (Signed)
Pt to ED via POV c/o shortness of breath x a few days. Pt states that she has had cough with yellow sputum. Pt states that she had chills a few days ago but none now. Pt denies fever. Pt denies known COVID exposure. Pt is able to speak in complete sentences at this time, no distress is noted. Pt is chronically on O2 at 2 liter via [place]

## 2019-11-30 NOTE — ED Notes (Signed)
Transport patient to floor room 242.AS

## 2019-11-30 NOTE — ED Notes (Signed)
ED TO INPATIENT HANDOFF REPORT  ED Nurse Name and Phone #: Anette Riedeloah [phone]  S Name/Age/Gender Marissa Luna 63 y.o. female Room/Bed: ED35A/ED35A  Code Status   Code Status: Full Code  Home/SNF/Other Home Patient oriented to: self, place, time and situation Is this baseline? Yes   Triage Complete: Triage complete  Chief Complaint Pneumonia due to COVID-19 virus [U07.1, J12.89]  Triage Note Pt to ED via POV c/o shortness of breath x a few days. Pt states that she has had cough with yellow sputum. Pt states that she had chills a few days ago but none now. Pt denies fever. Pt denies known COVID exposure. Pt is able to speak in complete sentences at this time, no distress is noted. Pt is chronically on O2 at 2 liter via [place]    Allergies No Known Allergies  Level of Care/Admitting Diagnosis ED Disposition    ED Disposition Condition Comment   Admit  Hospital Area: [facility] [[id number]]  Level of Care: Med-Surg [16]  Covid Evaluation: Confirmed COVID Positive  Diagnosis: Pneumonia due to COVID-19 virus [[id number]][[phone]]  Admitting Physician: Arnetha CourserAMIN, SUMAYYA [[id number]]  Attending Physician: Arnetha CourserAMIN, SUMAYYA [[id number]]  Estimated length of stay: past midnight tomorrow  Certification:: I certify this patient will need inpatient services for at least 2 midnights       B Medical/Surgery History Past Medical History:  Diagnosis Date  . Asthma   . Environmental allergies   . Hypertension   . Sarcoidosis    Past Surgical History:  Procedure Laterality Date  . CHOLECYSTECTOMY    . COLONOSCOPY N/A 10/26/2015   Procedure: COLONOSCOPY;  Surgeon: Wallace CullensPaul Y Oh, MD;  [facility];  Service: Gastroenterology;  Laterality: N/A;     A IV Location/Drains/Wounds Patient Lines/Drains/Airways Status   Active Line/Drains/Airways    Name:   Placement date:   Placement time:   Site:   Days:   Peripheral IV 11/30/19 Left Antecubital   11/30/19    1305    Antecubital   less  than 1          Intake/Output Last 24 hours No intake or output data in the 24 hours ending 11/30/19 2131  Labs/Imaging Results for orders placed or performed during the hospital encounter of 11/30/19 (from the past 48 hour(s))  CBC with Differential     Status: None   Collection Time: 11/30/19  7:44 AM  Result Value Ref Range   WBC 5.4 4.0 - 10.5 K/uL   RBC 4.58 3.87 - 5.11 MIL/uL   Hemoglobin 13.0 12.0 - 15.0 g/dL   HCT 57.841.0 46.936.0 - 62.946.0 %   MCV 89.5 80.0 - 100.0 fL   MCH 28.4 26.0 - 34.0 pg   MCHC 31.7 30.0 - 36.0 g/dL   RDW 52.813.6 41.311.5 - 24.415.5 %   Platelets 200 150 - 400 K/uL   nRBC 0.0 0.0 - 0.2 %   Neutrophils Relative % 63 %   Neutro Abs 3.4 1.7 - 7.7 K/uL   Lymphocytes Relative 16 %   Lymphs Abs 0.9 0.7 - 4.0 K/uL   Monocytes Relative 14 %   Monocytes Absolute 0.8 0.1 - 1.0 K/uL   Eosinophils Relative 6 %   Eosinophils Absolute 0.3 0.0 - 0.5 K/uL   Basophils Relative 1 %   Basophils Absolute 0.0 0.0 - 0.1 K/uL   Immature Granulocytes 0 %   Abs Immature Granulocytes 0.02 0.00 - 0.07 K/uL    Comment: Performed at [facility], [address] [place], [place] [address]  Basic metabolic panel     Status: Abnormal   Collection Time: 11/30/19  7:44 AM  Result Value Ref Range   Sodium 144 135 - 145 mmol/L   Potassium 4.1 3.5 - 5.1 mmol/L   Chloride 107 98 - 111 mmol/L   CO2 28 22 - 32 mmol/L   Glucose, Bld 107 (H) 70 - 99 mg/dL   BUN 24 (H) 8 - 23 mg/dL   Creatinine, Ser 1.91 (H) 0.44 - 1.00 mg/dL   Calcium 8.7 (L) 8.9 - 10.3 mg/dL   GFR calc non Af Amer 55 (L) >60 mL/min   GFR calc Af Amer >60 >60 mL/min   Anion gap 9 5 - 15    Comment: Performed at [facility], [address] [place], [place] [address]  Troponin I (High Sensitivity)     Status: None   Collection Time: 11/30/19  7:44 AM  Result Value Ref Range   Troponin I (High Sensitivity) 3 <18 ng/L    Comment: (NOTE) Elevated high sensitivity troponin I (hsTnI) values and significant   changes across serial measurements may suggest ACS but many other  chronic and acute conditions are known to elevate hsTnI results.  Refer to the "Links" section for chest pain algorithms and additional  guidance. Performed at [facility], [address] [place], [place] [address]   Procalcitonin - Baseline     Status: None   Collection Time: 11/30/19  7:44 AM  Result Value Ref Range   Procalcitonin <0.10 ng/mL    Comment:        Interpretation: PCT (Procalcitonin) <= 0.5 ng/mL: Systemic infection (sepsis) is not likely. Local bacterial infection is possible. (NOTE)       Sepsis PCT Algorithm           Lower Respiratory Tract                                      Infection PCT Algorithm    ----------------------------     ----------------------------         PCT < 0.25 ng/mL                PCT < 0.10 ng/mL         Strongly encourage             Strongly discourage   discontinuation of antibiotics    initiation of antibiotics    ----------------------------     -----------------------------       PCT 0.25 - 0.50 ng/mL            PCT 0.10 - 0.25 ng/mL               OR       >80% decrease in PCT            Discourage initiation of                                            antibiotics      Encourage discontinuation           of antibiotics    ----------------------------     -----------------------------         PCT >= 0.50 ng/mL  PCT 0.26 - 0.50 ng/mL               AND        <80% decrease in PCT             Encourage initiation of                                             antibiotics       Encourage continuation           of antibiotics    ----------------------------     -----------------------------        PCT >= 0.50 ng/mL                  PCT > 0.50 ng/mL               AND         increase in PCT                  Strongly encourage                                      initiation of antibiotics    Strongly encourage escalation           of  antibiotics                                     -----------------------------                                           PCT <= 0.25 ng/mL                                                 OR                                        > 80% decrease in PCT                                     Discontinue / Do not initiate                                             antibiotics Performed at [facility], [address] [place], [place] [address]   ABO/Rh     Status: None   Collection Time: 11/30/19  7:44 AM  Result Value Ref Range   ABO/RH(D)      A POS Performed at [facility], [address] [place], [place] [address]   POC SARS Coronavirus 2 Ag     Status: Abnormal   Collection Time: 11/30/19 12:40 PM  Result Value Ref Range  SARS Coronavirus 2 Ag POSITIVE (A) NEGATIVE    Comment: (NOTE) SARS-CoV-2 antigen PRESENT. Positive results indicate the presence of viral antigens, but clinical correlation with patient history and other diagnostic information is necessary to determine patient infection status.  Positive results do not rule out bacterial infection or co-infection  with other viruses. False positive results are rare but can occur, and confirmatory RT-PCR testing may be appropriate in some circumstances. The expected result is Negative. Fact Sheet for Patients: https://sanders-williams.net/ Fact Sheet for Providers: https://martinez.com/  This test is not yet approved or cleared by the [place] FDA and  has been authorized for detection and/or diagnosis of SARS-CoV-2 by FDA under an Emergency Use Authorization (EUA).  This EUA will remain in effect (meaning this test can be used) for the duration of  the COVID-19 declaration under Section 564(b)(1) of the Act, 21 U.S.C. section 360bbb-3(b)(1), unless the a uthorization is terminated or revoked sooner.   Troponin I (High Sensitivity)     Status: None   Collection Time:  11/30/19 12:59 PM  Result Value Ref Range   Troponin I (High Sensitivity) 3 <18 ng/L    Comment: (NOTE) Elevated high sensitivity troponin I (hsTnI) values and significant  changes across serial measurements may suggest ACS but many other  chronic and acute conditions are known to elevate hsTnI results.  Refer to the "Links" section for chest pain algorithms and additional  guidance. Performed at [facility], [address] [place], [place] [address]   Hepatic function panel     Status: None   Collection Time: 11/30/19 12:59 PM  Result Value Ref Range   Total Protein 7.2 6.5 - 8.1 g/dL   Albumin 3.7 3.5 - 5.0 g/dL   AST 20 15 - 41 U/L   ALT 15 0 - 44 U/L   Alkaline Phosphatase 65 38 - 126 U/L   Total Bilirubin 0.4 0.3 - 1.2 mg/dL   Bilirubin, Direct 0.1 0.0 - 0.2 mg/dL   Indirect Bilirubin 0.3 0.3 - 0.9 mg/dL    Comment: Performed at [facility], [address] [place], [place] [address]  Fibrin derivatives [facility] only)     Status: Abnormal   Collection Time: 11/30/19  6:20 PM  Result Value Ref Range   Fibrin derivatives D-dimer ([facility]) 678.63 (H) 0.00 - 499.00 ng/mL (FEU)    Comment: (NOTE) <> Exclusion of Venous Thromboembolism (VTE) - OUTPATIENT ONLY   (Emergency Department or [place])   0-499 ng/ml (FEU): With a low to intermediate pretest probability                      for VTE this test result excludes the diagnosis                      of VTE.   >499 ng/ml (FEU) : VTE not excluded; additional work up for VTE is                      required. <> Testing on Inpatients and Evaluation of Disseminated Intravascular   Coagulation (DIC) Reference Range:   0-499 ng/ml (FEU) Performed at [facility], [address] [place], [place] [address]   Ferritin     Status: None   Collection Time: 11/30/19  6:20 PM  Result Value Ref Range   Ferritin 130 11 - 307 ng/mL    Comment: Performed at [facility], [address]  [place], [place] [address]  Fibrinogen     Status: None   Collection Time: 11/30/19  6:20 PM  Result Value Ref Range   Fibrinogen 412 210 - 475 mg/dL    Comment: Performed at [facility], [address] [place], [place] [address]   DG Chest 2 View  Result Date: 11/30/2019 CLINICAL DATA:  Shortness of breath and cough. EXAM: CHEST - 2 VIEW COMPARISON:  February 12, 2018 FINDINGS: The mediastinal contour and cardiac silhouette are normal. Patchy opacities are identified throughout bilateral lungs. 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 No acute abnormalities identified in visualized bony structures. IMPRESSION: 1. Patchy opacities are identified throughout bilateral lungs suspicious for pneumonia. 2. 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 Electronically Signed   By: Sherian Rein M.D.   On: 11/30/2019 08:43    Pending Labs Unresulted Labs (From admission, onward)    Start     Ordered   12/01/19 0500  CBC with Differential/Platelet  Daily,   STAT     11/30/19 1316   12/01/19 0500  C-reactive protein  Daily,   STAT     11/30/19 1316   12/01/19 0500  Comprehensive metabolic panel  Daily,   STAT     11/30/19 1316   12/01/19 0500  Fibrin derivatives D-Dimer ([facility] only)  Daily,   STAT     11/30/19 1316   12/01/19 0500  Magnesium  Daily,   STAT     11/30/19 1316   12/01/19 0500  Ferritin  Daily,   STAT     11/30/19 1316   12/01/19 0500  Phosphorus  Daily,   STAT     11/30/19 1316   11/30/19 1314  C-reactive protein  Once,   STAT     11/30/19 1316   11/30/19 1314  Hepatitis B surface antigen  Once,   STAT     11/30/19 1316   11/30/19 1311  HIV Antibody (routine testing w rflx)  (HIV Antibody (Routine testing w reflex) panel)  Once,   STAT     11/30/19 1316          Vitals/Pain Today's Vitals   11/30/19 1831 11/30/19 1940 11/30/19 2029 11/30/19 2108  BP: 115/86     Pulse: 89     Resp:      Temp:      TempSrc:      SpO2:  98%  99% 98%  Weight:      Height:      PainSc: 0-No pain 0-No pain      Isolation Precautions Airborne and Contact precautions  Medications Medications  remdesivir 200 mg in sodium chloride 0.9% 250 mL IVPB (0 mg Intravenous Stopped 11/30/19 1645)    Followed by  remdesivir 100 mg in sodium chloride 0.9 % 100 mL IVPB (has no administration in time range)  Ipratropium-Albuterol (COMBIVENT) respimat 1 puff (1 puff Inhalation Given 11/30/19 1950)  dexamethasone (DECADRON) injection 6 mg (6 mg Intravenous Not Given 11/30/19 1533)  guaiFENesin-dextromethorphan (ROBITUSSIN DM) 100-10 MG/5ML syrup 10 mL (has no administration in time range)  chlorpheniramine-HYDROcodone (TUSSIONEX) 10-8 MG/5ML suspension 5 mL (has no administration in time range)  ascorbic acid (VITAMIN C) tablet 500 mg (500 mg Oral Not Given 11/30/19 1756)  zinc sulfate capsule 220 mg (220 mg Oral Not Given 11/30/19 1757)  acetaminophen (TYLENOL) tablet 650 mg (has no administration in time range)  polyethylene glycol (MIRALAX / GLYCOLAX) packet 17 g (has no administration in time range)  ondansetron (ZOFRAN) tablet 4 mg (has no administration in time range)    Or  ondansetron (ZOFRAN) injection 4 mg (has no administration in time range)  0.9 %  sodium chloride infusion ( Intravenous New Bag/Given 11/30/19 1549)  albuterol (VENTOLIN HFA) 108 (90 Base) MCG/ACT inhaler 1-2 puff (has no administration in time range)  ALPRAZolam (XANAX) tablet 0.25 mg (has no administration in time range)  rOPINIRole (REQUIP) tablet 0.25 mg (0.25 mg Oral Given 11/30/19 1952)  folic acid (FOLVITE) tablet 1 mg (1 mg Oral Not Given 11/30/19 1757)  fluticasone (FLONASE) 50 MCG/ACT nasal spray 1 spray (1 spray Each Nare Not Given 11/30/19 1757)  umeclidinium-vilanterol (ANORO ELLIPTA) 62.5-25 MCG/INH 1 puff (has no administration in time range)  amLODipine (NORVASC) tablet 10 mg (has no administration in time range)  calcium-vitamin D (OSCAL WITH  D) 500-200 MG-UNIT per tablet 1 tablet (has no administration in time range)  loratadine (CLARITIN) tablet 10 mg (has no administration in time range)  Vitamin D (Ergocalciferol) (DRISDOL) capsule 50,000 Units (has no administration in time range)  sodium chloride flush (NS) 0.9 % injection 3 mL (has no administration in time range)  dexamethasone (DECADRON) injection 6 mg (6 mg Intravenous Given 11/30/19 1301)    Mobility walks Low fall risk   Focused Assessments Cardiac Assessment Handoff:    Lab Results  Component Value Date   CKTOTAL 105 06/01/2013   CKMB < 0.5 (L) 06/01/2013   TROPONINI < 0.02 06/01/2013   No results found for: DDIMER Does the Patient currently have chest pain? No  , Pulmonary Assessment Handoff:  Lung sounds:   O2 Device: Nasal Cannula O2 Flow Rate (L/min): 2 L/min      R Recommendations: See Admitting Provider Note  Report given to:   Additional Notes:

## 2019-11-30 NOTE — ED Provider Notes (Signed)
[facility] Emergency Department Provider Note   ____________________________________________   I have reviewed the triage vital signs and the nursing notes.   HISTORY  Chief Complaint Shortness of Breath   History limited by: Not Limited   HPI Marissa Luna is a 63 y.o. female who presents to the emergency department today because of concerns for shortness of breath.  Patient has underlying COPD and is on oxygen at baseline.  She states for the past few days however her shortness of breath is worse than normal.  She appreciates it most when she exerts herself.  Her shortness of breath has been accompanied by productive cough.  She has been bringing up phlegm.  She has been bringing up phlegm.  No fevers although she had some chills.  No known sick contacts has been to the stores.    Records reviewed. Per medical record review patient has a history of sarcoidosis, HTN, asthma.   Past Medical History:  Diagnosis Date  . Asthma   . Environmental allergies   . Hypertension   . Sarcoidosis     Patient Active Problem List   Diagnosis Date Noted  . Unsatisfactory cervical Papanicolaou smear 10/28/2019  . Pain in both lower extremities 06/30/2019  . Restless leg syndrome 12/20/2018  . Oxygen dependent 10/17/2018  . Other fatigue 10/17/2018  . Abnormal weight gain 10/17/2018  . Sarcoidosis   . Need for prophylactic vaccination against Streptococcus pneumoniae (pneumococcus) 07/11/2018  . Routine cervical smear 03/25/2018  . Dysuria 03/25/2018  . Chronic respiratory failure with hypoxia (HCC) 03/13/2018  . Acute bronchitis with COPD (HCC) 02/22/2018  . Lymphocytosis 02/22/2018  . Abnormal kidney function 02/22/2018  . Sepsis (HCC) 01/11/2018  . Community acquired pneumonia of left lung 01/11/2018  . Influenza A 01/11/2018  . 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 Hypoxemia 01/08/2018  . Allergic rhinitis, unspecified 01/08/2018  . Essential  hypertension 01/08/2018  . 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 Shortness of breath 01/08/2018    Past Surgical History:  Procedure Laterality Date  . CHOLECYSTECTOMY    . COLONOSCOPY N/A 10/26/2015   Procedure: COLONOSCOPY;  Surgeon: Wallace Cullens, MD;  Location: [facility] ENDOSCOPY;  Service: Gastroenterology;  Laterality: N/A;    Prior to Admission medications   Medication Sig Start Date End Date Taking? Authorizing Provider  albuterol (PROVENTIL) (2.5 MG/3ML) 0.083% nebulizer solution Take 3 mLs (2.5 mg total) by nebulization every 6 (six) hours as needed. 06/12/18   Carlean Jews, NP  albuterol (VENTOLIN HFA) 108 (90 Base) MCG/ACT inhaler INHALE 2 PUFFS 4 TIMES A DAY AS NEEDED FOR WHEEZING 10/08/19   Boscia, Kathlynn Grate, NP  ALPRAZolam (XANAX) 0.25 MG tablet Take 1 tablet (0.25 mg total) by mouth 2 (two) times daily as needed for anxiety. 12/06/17   Carlean Jews, NP  amLODipine (NORVASC) 10 MG tablet Take 1 tablet (10 mg total) by mouth daily. 10/08/19   Carlean Jews, NP  Calcium Carbonate-Vitamin D 600-400 MG-UNIT tablet Take by mouth.    [provider]  cetirizine (ZYRTEC) 10 MG tablet Take 10 mg by mouth.    [provider]  cyclobenzaprine (FLEXERIL) 5 MG tablet Take 1 tablet (5 mg total) by mouth 3 (three) times daily as needed. 04/27/19   Menshew, Charlesetta Ivory, PA-C  fluticasone (FLONASE) 50 MCG/ACT nasal spray Place into the nose.    [provider]  folic acid (FOLVITE) 1 MG tablet Take 1 mg by mouth.  [provider]  ibuprofen (ADVIL) 800 MG tablet Take 1 tablet (800 mg total) by mouth 3 (three) times daily. 06/25/19   Carlean JewsBoscia, Heather E, NP  OXYGEN Inhale 2 L into the lungs.    [provider]  predniSONE (DELTASONE) 5 MG tablet TAKE 1 TABLET BY MOUTH EVERY DAY WITH BREAKFAST 08/09/19   Scarboro, Coralee NorthAdam J, NP  rOPINIRole (REQUIP) 0.25 MG tablet Take 1 tablet (0.25 mg total) by mouth at bedtime. 03/19/19   Boscia, Kathlynn GrateHeather E, NP  TRELEGY  ELLIPTA 100-62.5-25 MCG/INH AEPB Inhale 1 puff into the lungs daily. 12/13/18   Yevonne PaxKhan, Saadat A, MD  Vitamin D, Ergocalciferol, (DRISDOL) 1.25 MG (50000 UT) CAPS capsule Take 1 capsule (50,000 Units total) by mouth once a week. 10/10/19   Carlean JewsBoscia, Heather E, NP    Allergies Patient has no known allergies.  Family History  Problem Relation Age of Onset  . Lung cancer Mother   . Colon cancer Father   . Diabetes Sister   . Breast cancer Sister     Social History Social History   Tobacco Use  . Smoking status: Never Smoker  . Smokeless tobacco: Never Used  Substance Use Topics  . Alcohol use: No  . Drug use: No    Review of Systems Constitutional: No fever. Positive for chills.  Eyes: No visual changes. ENT: No sore throat. Cardiovascular: Denies chest pain. Respiratory: Positive for shortness of breath. Gastrointestinal: No abdominal pain.  No nausea, no vomiting.  No diarrhea.   Genitourinary: Negative for dysuria. Musculoskeletal: Positive for body aches.  Skin: Negative for rash. Neurological: Negative for headaches, focal weakness or numbness.  ____________________________________________   PHYSICAL EXAM:  VITAL SIGNS: ED Triage Vitals  Enc Vitals Group     BP 11/30/19 0724 (!) 147/65     Pulse Rate 11/30/19 0724 96     Resp 11/30/19 0724 16     Temp 11/30/19 0724 98.4 F (36.9 C)     Temp Source 11/30/19 0724 Oral     SpO2 11/30/19 0724 96 %     Weight 11/30/19 0724 160 lb (72.6 kg)     Height 11/30/19 0724 4\' 11"  (1.499 m)     Head Circumference --      Peak Flow --      Pain Score 11/30/19 0737 0   Constitutional: Alert and oriented.  Eyes: Conjunctivae are normal.  ENT      Head: Normocephalic and atraumatic.      Nose: No congestion/rhinnorhea.      Mouth/Throat: Mucous membranes are moist.      Neck: No stridor. Hematological/Lymphatic/Immunilogical: No cervical lymphadenopathy. Cardiovascular: Normal rate, regular rhythm.  No murmurs, rubs, or  gallops.  Respiratory: Normal respiratory effort without tachypnea nor retractions. Diffuse rhonchi. Gastrointestinal: Soft and non tender. No rebound. No guarding.  Genitourinary: Deferred Musculoskeletal: Normal range of motion in all extremities. No lower extremity edema. Neurologic:  Normal speech and language. No gross focal neurologic deficits are appreciated.  Skin:  Skin is warm, dry and intact. No rash noted. Psychiatric: Mood and affect are normal. Speech and behavior are normal. Patient exhibits appropriate insight and judgment.  ____________________________________________    LABS (pertinent positives/negatives)  Trop hs 3 Procalcitonin <0.10 CBC wbc 5.4, hgb 13.0, plt 200 BMP na 144, k 4.1, glu 107, cr 1.07  ____________________________________________   EKG  None  ____________________________________________    RADIOLOGY  CXR Patchy opacities  ____________________________________________   PROCEDURES  Procedures  ____________________________________________   INITIAL  IMPRESSION / ASSESSMENT AND PLAN / ED COURSE  Pertinent labs & imaging results that were available during my care of the patient were reviewed by me and considered in my medical decision making (see chart for details).   Patient presented to the emergency department today because of concerns for increasing shortness of breath.  Patient is on oxygen at baseline.  Patient's x-ray was concerning for possible Covid infection and she did test positive for Covid.  When she ambulated her oxygen saturations dropped to the mid high 80s.  Will plan on admission.  Discussed this with patient.  ____________________________________________   FINAL CLINICAL IMPRESSION(S) / ED DIAGNOSES  Final diagnoses:  COVID-19  Hypoxia  SOB (shortness of breath)     Note: This dictation was prepared with Dragon dictation. Any transcriptional errors that result from this process are unintentional      Nance Pear, MD 11/30/19 1259

## 2019-11-30 NOTE — ED Notes (Addendum)
Spoke with Rufina Falco, fnp regarding dimer [phone]. No new verbal orders received.

## 2019-11-30 NOTE — ED Notes (Signed)
Pt ambulated on room air.  Osat dropped to 88% during ambulation

## 2019-11-30 NOTE — H&P (Addendum)
History and Physical    Marissa Luna KDX:[id number] DOB: 1956/04/01 DOA: 11/30/2019  PCP: Ronnell Freshwater, NP   Patient coming from: Home  I have personally briefly reviewed patient's old medical records in [facility]  Chief Complaint: Shortness of breath  HPI: Marissa Luna is a 63 y.o. female with medical history significant of asthma, hypertension and sarcoidosis on home oxygen at 2 L, came to ED with complaint of shortness of breath.  She was having some productive cough for the past 2 days.  Yesterday she experienced some chills but had not noticed any fever.  Denies any generalized body aches, headache, chest pain or orthopnea.  Denies any nausea, vomiting or diarrhea.  Her appetite is normal.  She denies any urinary symptoms.  Denies any sick contacts.  ED Course: Hemodynamically stable with mild tachycardia, saturating well on 2 L at rest but desaturated to 88% with ambulation requiring 3 to 4 L, COVID-19 test was positive, chest x-ray with bilateral opacities and lab positive for mild AKI with creatinine of 1.07 with baseline below 1, procalcitonin <0.10.  Review of Systems: As per HPI otherwise 10 point review of systems negative.    Past Medical History:  Diagnosis Date  . Asthma   . Environmental allergies   . Hypertension   . Sarcoidosis     Past Surgical History:  Procedure Laterality Date  . CHOLECYSTECTOMY    . COLONOSCOPY N/A 10/26/2015   Procedure: COLONOSCOPY;  Surgeon: Hulen Luster, MD;  Location: [facility] ENDOSCOPY;  Service: Gastroenterology;  Laterality: N/A;     reports that she has never smoked. She has never used smokeless tobacco. She reports that she does not drink alcohol or use drugs.  No Known Allergies  Family History  Problem Relation Age of Onset  . Lung cancer Mother   . Colon cancer Father   . Diabetes Sister   . Breast cancer Sister     Prior to Admission medications   Medication Sig Start Date End Date Taking? Authorizing Provider   albuterol (PROVENTIL) (2.5 MG/3ML) 0.083% nebulizer solution Take 3 mLs (2.5 mg total) by nebulization every 6 (six) hours as needed. 06/12/18   Ronnell Freshwater, NP  albuterol (VENTOLIN HFA) 108 (90 Base) MCG/ACT inhaler INHALE 2 PUFFS 4 TIMES A DAY AS NEEDED FOR WHEEZING 10/08/19   Boscia, Greer Ee, NP  ALPRAZolam (XANAX) 0.25 MG tablet Take 1 tablet (0.25 mg total) by mouth 2 (two) times daily as needed for anxiety. 12/06/17   Ronnell Freshwater, NP  amLODipine (NORVASC) 10 MG tablet Take 1 tablet (10 mg total) by mouth daily. 10/08/19   Ronnell Freshwater, NP  Calcium Carbonate-Vitamin D 600-400 MG-UNIT tablet Take by mouth.    [provider]  cetirizine (ZYRTEC) 10 MG tablet Take 10 mg by mouth.    [provider]  cyclobenzaprine (FLEXERIL) 5 MG tablet Take 1 tablet (5 mg total) by mouth 3 (three) times daily as needed. 04/27/19   Menshew, Dannielle Karvonen, PA-C  fluticasone (FLONASE) 50 MCG/ACT nasal spray Place into the nose.    [provider]  folic acid (FOLVITE) 1 MG tablet Take 1 mg by mouth.    [provider]  ibuprofen (ADVIL) 800 MG tablet Take 1 tablet (800 mg total) by mouth 3 (three) times daily. 06/25/19   Ronnell Freshwater, NP  OXYGEN Inhale 2 L into the lungs.    [provider]  predniSONE (DELTASONE) 5 MG tablet TAKE  1 TABLET BY MOUTH EVERY DAY WITH BREAKFAST 08/09/19   Johnna Acosta, NP  rOPINIRole (REQUIP) 0.25 MG tablet Take 1 tablet (0.25 mg total) by mouth at bedtime. 03/19/19   Boscia, Kathlynn Grate, NP  TRELEGY ELLIPTA 100-62.5-25 MCG/INH AEPB Inhale 1 puff into the lungs daily. 12/13/18   Yevonne Pax, MD  Vitamin D, Ergocalciferol, (DRISDOL) 1.25 MG (50000 UT) CAPS capsule Take 1 capsule (50,000 Units total) by mouth once a week. 10/10/19   Carlean Jews, NP    Physical Exam: Vitals:   11/30/19 1130 11/30/19 1200 11/30/19 1230 11/30/19 1300  BP: 131/66 122/80 137/70 132/71  Pulse: 95 (!) 101 93 94  Resp: (!) 23  (!) 21 17    Temp:      TempSrc:      SpO2: 98% 99% 100% 98%  Weight:      Height:        General: Vital signs reviewed.  Patient is well-developed and well-nourished, in no acute distress and cooperative with exam.  Head: Normocephalic and atraumatic. Eyes: EOMI, conjunctivae normal, no scleral icterus.  ENMT: Mucous membranes are moist. Neck: Supple, trachea midline, normal ROM, no JVD, masses, thyromegaly, or carotid bruit present.  Cardiovascular: RRR, S1 normal, S2 normal, no murmurs, gallops, or rubs. Pulmonary/Chest: Clear to auscultation bilaterally, no wheezes, rales, or rhonchi. Abdominal: Soft, non-tender, non-distended, BS +, no masses, organomegaly, or guarding present.  Musculoskeletal: No joint deformities, erythema, or stiffness, ROM full and nontender. Extremities: No lower extremity edema bilaterally,  pulses symmetric and intact bilaterally. No cyanosis or clubbing. Neurological: A&O x3, Strength is normal and symmetric bilaterally, cranial nerve II-XII are grossly intact, no focal motor deficit, sensory intact to light touch bilaterally.  Skin: Warm, dry and intact. No rashes or erythema. Psychiatric: Normal mood and affect. speech and behavior is normal. Cognition and memory are normal.   Labs on Admission: I have personally reviewed following labs and imaging studies  CBC: Recent Labs  Lab 11/30/19 0744  WBC 5.4  NEUTROABS 3.4  HGB 13.0  HCT 41.0  MCV 89.5  PLT 200   Basic Metabolic Panel: Recent Labs  Lab 11/30/19 0744  NA 144  K 4.1  CL 107  CO2 28  GLUCOSE 107*  BUN 24*  CREATININE 1.07*  CALCIUM 8.7*   GFR: Estimated Creatinine Clearance: 46.1 mL/min (A) (by C-G formula based on SCr of 1.07 mg/dL (H)). Liver Function Tests: No results for input(s): AST, ALT, ALKPHOS, BILITOT, PROT, ALBUMIN in the last 168 hours. No results for input(s): LIPASE, AMYLASE in the last 168 hours. No results for input(s): AMMONIA in the last 168 hours. Coagulation  Profile: No results for input(s): INR, PROTIME in the last 168 hours. Cardiac Enzymes: No results for input(s): CKTOTAL, CKMB, CKMBINDEX, TROPONINI in the last 168 hours. BNP (last 3 results) No results for input(s): PROBNP in the last 8760 hours. HbA1C: No results for input(s): HGBA1C in the last 72 hours. CBG: No results for input(s): GLUCAP in the last 168 hours. Lipid Profile: No results for input(s): CHOL, HDL, LDLCALC, TRIG, CHOLHDL, LDLDIRECT in the last 72 hours. Thyroid Function Tests: No results for input(s): TSH, T4TOTAL, FREET4, T3FREE, THYROIDAB in the last 72 hours. Anemia Panel: No results for input(s): VITAMINB12, FOLATE, FERRITIN, TIBC, IRON, RETICCTPCT in the last 72 hours. Urine analysis:    Component Value Date/Time   COLORURINE YELLOW (A) 01/11/2018 2100   APPEARANCEUR Clear 06/25/2019 0920   LABSPEC 1.010 01/11/2018 2100   PHURINE  6.0 01/11/2018 2100   GLUCOSEU Negative 06/25/2019 0920   HGBUR SMALL (A) 01/11/2018 2100   BILIRUBINUR Negative 06/25/2019 0920   KETONESUR NEGATIVE 01/11/2018 2100   PROTEINUR Negative 06/25/2019 0920   PROTEINUR NEGATIVE 01/11/2018 2100   NITRITE Negative 06/25/2019 0920   NITRITE NEGATIVE 01/11/2018 2100   LEUKOCYTESUR Negative 06/25/2019 0920    Radiological Exams on Admission: DG Chest 2 View  Result Date: 11/30/2019 CLINICAL DATA:  Shortness of breath and cough. EXAM: CHEST - 2 VIEW COMPARISON:  February 12, 2018 FINDINGS: The mediastinal contour and cardiac silhouette are normal. Patchy opacities are identified throughout bilateral lungs. 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 No acute abnormalities identified in visualized bony structures. IMPRESSION: 1. Patchy opacities are identified throughout bilateral lungs suspicious for pneumonia. 2. 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 Electronically Signed   By: Sherian ReinWei-Chen  Lin M.D.   On: 11/30/2019 08:43     Assessment/Plan Active Problems:   Pneumonia due to COVID-19 virus   Pneumonia due to COVID-19 virus.  Patient with positive COVID-19 test. Desaturating with ambulation, saturating well on her home oxygen of 2 L at rest. Procalcitonin was negative. -Check inflammatory markers which include D-dimer, CRP, fibrinogen and ferritin level. -Check hepatic function as she only has BMP done in the ED, prior CMP a year ago was within normal limit-hepatic function panel within normal limit. -Start her on remdesivir and Decadron -Start her on vitamin C and zinc supplement.  Hypertension. -Continuing home amlodipine.  History of sarcoidosis and asthma.  No current acute exacerbation or wheezing. Patient was on low-dose prednisone at home-getting Decadron for 10 days and then restart her home dose of prednisone. Continue oxygen therapy-titrate as needed.  DVT prophylaxis: Lovenox Code Status: Full code Family Communication: No family at bedside Disposition Plan: Pending improvement and completion of remdesivir. Consults called: None Admission status: Inpatient   Arnetha CourserSumayya Hatem Cull MD [facility] Pager [phone]  If 7PM-7AM, please contact night-coverage [email or web address] Password [email or web address]  11/30/2019, 1:21 PM   This record has been created using Conservation officer, historic buildingsDragon voice recognition software. Errors have been sought and corrected,but may not always be located. Such creation errors do not reflect on the standard of care.

## 2019-12-01 ENCOUNTER — Other Ambulatory Visit: Payer: Self-pay

## 2019-12-01 LAB — CBC WITH DIFFERENTIAL/PLATELET
Abs Immature Granulocytes: 0.01 10*3/uL (ref 0.00–0.07)
Basophils Absolute: 0 10*3/uL (ref 0.0–0.1)
Basophils Relative: 0 %
Eosinophils Absolute: 0 10*3/uL (ref 0.0–0.5)
Eosinophils Relative: 0 %
HCT: 36 % (ref 36.0–46.0)
Hemoglobin: 11.6 g/dL — ABNORMAL LOW (ref 12.0–15.0)
Immature Granulocytes: 0 %
Lymphocytes Relative: 16 %
Lymphs Abs: 0.7 10*3/uL (ref 0.7–4.0)
MCH: 28.5 pg (ref 26.0–34.0)
MCHC: 32.2 g/dL (ref 30.0–36.0)
MCV: 88.5 fL (ref 80.0–100.0)
Monocytes Absolute: 0.3 10*3/uL (ref 0.1–1.0)
Monocytes Relative: 7 %
Neutro Abs: 3.4 10*3/uL (ref 1.7–7.7)
Neutrophils Relative %: 77 %
Platelets: 177 10*3/uL (ref 150–400)
RBC: 4.07 MIL/uL (ref 3.87–5.11)
RDW: 13.2 % (ref 11.5–15.5)
WBC: 4.4 10*3/uL (ref 4.0–10.5)
nRBC: 0 % (ref 0.0–0.2)

## 2019-12-01 LAB — PHOSPHORUS: Phosphorus: 2.7 mg/dL (ref 2.5–4.6)

## 2019-12-01 LAB — COMPREHENSIVE METABOLIC PANEL
ALT: 14 U/L (ref 0–44)
AST: 18 U/L (ref 15–41)
Albumin: 3.2 g/dL — ABNORMAL LOW (ref 3.5–5.0)
Alkaline Phosphatase: 55 U/L (ref 38–126)
Anion gap: 8 (ref 5–15)
BUN: 18 mg/dL (ref 8–23)
CO2: 25 mmol/L (ref 22–32)
Calcium: 8.5 mg/dL — ABNORMAL LOW (ref 8.9–10.3)
Chloride: 110 mmol/L (ref 98–111)
Creatinine, Ser: 0.76 mg/dL (ref 0.44–1.00)
GFR calc Af Amer: >60 mL/min (ref >60)
GFR calc non Af Amer: >60 mL/min (ref >60)
Glucose, Bld: 122 mg/dL — ABNORMAL HIGH (ref 70–99)
Potassium: 4.4 mmol/L (ref 3.5–5.1)
Sodium: 143 mmol/L (ref 135–145)
Total Bilirubin: 0.4 mg/dL (ref 0.3–1.2)
Total Protein: 6.5 g/dL (ref 6.5–8.1)

## 2019-12-01 LAB — HIV ANTIBODY (ROUTINE TESTING W REFLEX): HIV Screen 4th Generation wRfx: NONREACTIVE

## 2019-12-01 LAB — FIBRIN DERIVATIVES D-DIMER (ARMC ONLY): Fibrin derivatives D-dimer (ARMC): 665.73 ng/mL (FEU) — ABNORMAL HIGH (ref 0.00–499.00)

## 2019-12-01 LAB — C-REACTIVE PROTEIN
CRP: 8.7 mg/dL — ABNORMAL HIGH (ref <1.0)
CRP: 9.3 mg/dL — ABNORMAL HIGH (ref <1.0)

## 2019-12-01 LAB — HEPATITIS B SURFACE ANTIGEN: Hepatitis B Surface Ag: NONREACTIVE

## 2019-12-01 LAB — FERRITIN: Ferritin: 128 ng/mL (ref 11–307)

## 2019-12-01 LAB — MAGNESIUM: Magnesium: 2.2 mg/dL (ref 1.7–2.4)

## 2019-12-01 MED ORDER — ENOXAPARIN SODIUM 40 MG/0.4ML ~~LOC~~ SOLN
40.0000 mg | SUBCUTANEOUS | Status: DC
  Administered 2019-12-01 – 2019-12-03 (×3): 40 mg via SUBCUTANEOUS
  Filled 2019-12-01 (×3): qty 0.4

## 2019-12-01 NOTE — Progress Notes (Signed)
PROGRESS NOTE    Marissa Luna  EUM:[id number] DOB: May 28, 1956 DOA: 11/30/2019 PCP: Ronnell Freshwater, NP    Assessment & Plan:   Active Problems:   Hypoxia   SOB (shortness of breath)   COVID-19    Marissa Luna is a 63 y.o. AA female with medical history significant of asthma, hypertension and sarcoidosis on home oxygen at 2 L, came to ED with complaint of shortness of breath.    Acute on chronic hypoxic respiratory failure 2/2 Pneumonia due to COVID-19 virus.   Patient with positive COVID-19 test. Desaturating with ambulation, needing 3-4L O2.  Procalcitonin was negative. PLAN: -continue remdesivir and Decadron -continue vitamin C and zinc supplement. --combivent and Anoro scheduled  Hypertension. -Continuing home amlodipine.  History of sarcoidosis  Hx of asthma Chronic hypoxic respiratory failure on 2L O2 at baseline No current acute exacerbation. Patient was on low-dose prednisone at home-getting Decadron for 10 days and then restart her home dose of prednisone. Continue oxygen therapy-titrate as needed.   DVT prophylaxis: Lovenox SQ Code Status: Full code  Family Communication: not today Disposition Plan: Home   Subjective and Interval History:  Pt reported breathing already improved, and coughing up sputum.  No fever, chest pain, abdominal pain, N/V/D, dysuria, increased swelling.   Objective: Vitals:   12/01/19 0345 12/01/19 0756 12/01/19 1634 12/01/19 1938  BP: 110/69 (!) 124/57 124/61 (!) 142/68  Pulse: 79 83 91 (!) 103  Resp: 18 19 19    Temp: 98.8 F (37.1 C) 98.3 F (36.8 C) 98.2 F (36.8 C) 97.7 F (36.5 C)  TempSrc: Oral Oral Oral Oral  SpO2: 93% 99% 91% 99%  Weight: 72.5 kg     Height:        Intake/Output Summary (Last 24 hours) at 12/01/2019 2028 Last data filed at 12/01/2019 1940 Gross per 24 hour  Intake --  Output 0 ml  Net 0 ml   Filed Weights   11/30/19 0724 11/30/19 2333 12/01/19 0345  Weight: 72.6 kg 72.6 kg 72.5 kg     Examination:   Constitutional: NAD, AAOx3 HEENT: conjunctivae and lids normal, EOMI CV: RRR no M,R,G. Distal pulses +2.  No cyanosis.   RESP: Diffuse rhonchi and mild wheezes, normal respiratory effort, on 3L GI: +BS, NTND Extremities: No effusions, edema, or tenderness in BLE SKIN: warm, dry and intact Neuro: II - XII grossly intact.  Sensation intact Psych: Normal mood and affect.  Appropriate judgement and reason   Data Reviewed: I have personally reviewed following labs and imaging studies  CBC: Recent Labs  Lab 11/30/19 0744 12/01/19 0506  WBC 5.4 4.4  NEUTROABS 3.4 3.4  HGB 13.0 11.6*  HCT 41.0 36.0  MCV 89.5 88.5  PLT 200 540   Basic Metabolic Panel: Recent Labs  Lab 11/30/19 0744 12/01/19 0506  NA 144 143  K 4.1 4.4  CL 107 110  CO2 28 25  GLUCOSE 107* 122*  BUN 24* 18  CREATININE 1.07* 0.76  CALCIUM 8.7* 8.5*  MG  --  2.2  PHOS  --  2.7   GFR: Estimated Creatinine Clearance: 61.6 mL/min (by C-G formula based on SCr of 0.76 mg/dL). Liver Function Tests: Recent Labs  Lab 11/30/19 1259 12/01/19 0506  AST 20 18  ALT 15 14  ALKPHOS 65 55  BILITOT 0.4 0.4  PROT 7.2 6.5  ALBUMIN 3.7 3.2*   No results for input(s): LIPASE, AMYLASE in the last 168 hours. No results for input(s): AMMONIA in the last  168 hours. Coagulation Profile: No results for input(s): INR, PROTIME in the last 168 hours. Cardiac Enzymes: No results for input(s): CKTOTAL, CKMB, CKMBINDEX, TROPONINI in the last 168 hours. BNP (last 3 results) No results for input(s): PROBNP in the last 8760 hours. HbA1C: No results for input(s): HGBA1C in the last 72 hours. CBG: No results for input(s): GLUCAP in the last 168 hours. Lipid Profile: No results for input(s): CHOL, HDL, LDLCALC, TRIG, CHOLHDL, LDLDIRECT in the last 72 hours. Thyroid Function Tests: No results for input(s): TSH, T4TOTAL, FREET4, T3FREE, THYROIDAB in the last 72 hours. Anemia Panel: Recent Labs     11/30/19 1820 12/01/19 0506  FERRITIN 130 128   Sepsis Labs: Recent Labs  Lab 11/30/19 0744  PROCALCITON <0.10    No results found for this or any previous visit (from the past 240 hour(s)).    Radiology Studies: DG Chest 2 View  Result Date: 11/30/2019 CLINICAL DATA:  Shortness of breath and cough. EXAM: CHEST - 2 VIEW COMPARISON:  February 12, 2018 FINDINGS: The mediastinal contour and cardiac silhouette are normal. Patchy opacities are identified throughout bilateral lungs. 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 No acute abnormalities identified in visualized bony structures. IMPRESSION: 1. Patchy opacities are identified throughout bilateral lungs suspicious for pneumonia. 2. 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 Electronically Signed   By: Sherian Rein M.D.   On: 11/30/2019 08:43     Scheduled Meds: . amLODipine  10 mg Oral Daily  . vitamin C  500 mg Oral Daily  . calcium-vitamin D  1 tablet Oral Daily  . dexamethasone (DECADRON) injection  6 mg Intravenous Daily  . enoxaparin (LOVENOX) injection  40 mg Subcutaneous Q24H  . fluticasone  1 spray Each Nare Daily  . folic acid  1 mg Oral Daily  . Ipratropium-Albuterol  1 puff Inhalation Q6H  . loratadine  10 mg Oral Daily  . rOPINIRole  0.25 mg Oral QHS  . sodium chloride flush  3 mL Intravenous Q12H  . umeclidinium-vilanterol  1 puff Inhalation Daily  . Vitamin D (Ergocalciferol)  50,000 Units Oral Weekly  . zinc sulfate  220 mg Oral Daily   Continuous Infusions: . remdesivir 100 mg in NS 100 mL 100 mg (12/01/19 1122)     LOS: 1 day     Darlin Priestly, MD [facility] If 7PM-7AM, please contact night-coverage 12/01/2019, 8:28 PM

## 2019-12-01 NOTE — Plan of Care (Signed)
  Problem: Respiratory: Goal: Will maintain a patent airway Outcome: Progressing   

## 2019-12-01 NOTE — Plan of Care (Signed)
  Problem: Education: Goal: Knowledge of risk factors and measures for prevention of condition will improve Outcome: Progressing   Problem: Respiratory: Goal: Will maintain a patent airway Outcome: Progressing Goal: Complications related to the disease process, condition or treatment will be avoided or minimized Outcome: Progressing   

## 2019-12-02 LAB — FERRITIN: Ferritin: 159 ng/mL (ref 11–307)

## 2019-12-02 LAB — COMPREHENSIVE METABOLIC PANEL
ALT: 14 U/L (ref 0–44)
AST: 19 U/L (ref 15–41)
Albumin: 3.4 g/dL — ABNORMAL LOW (ref 3.5–5.0)
Alkaline Phosphatase: 59 U/L (ref 38–126)
Anion gap: 7 (ref 5–15)
BUN: 22 mg/dL (ref 8–23)
CO2: 29 mmol/L (ref 22–32)
Calcium: 8.5 mg/dL — ABNORMAL LOW (ref 8.9–10.3)
Chloride: 107 mmol/L (ref 98–111)
Creatinine, Ser: 0.86 mg/dL (ref 0.44–1.00)
GFR calc Af Amer: >60 mL/min (ref >60)
GFR calc non Af Amer: >60 mL/min (ref >60)
Glucose, Bld: 116 mg/dL — ABNORMAL HIGH (ref 70–99)
Potassium: 4.3 mmol/L (ref 3.5–5.1)
Sodium: 143 mmol/L (ref 135–145)
Total Bilirubin: 0.4 mg/dL (ref 0.3–1.2)
Total Protein: 6.9 g/dL (ref 6.5–8.1)

## 2019-12-02 LAB — CBC WITH DIFFERENTIAL/PLATELET
Abs Immature Granulocytes: 0.03 10*3/uL (ref 0.00–0.07)
Basophils Absolute: 0 10*3/uL (ref 0.0–0.1)
Basophils Relative: 0 %
Eosinophils Absolute: 0 10*3/uL (ref 0.0–0.5)
Eosinophils Relative: 0 %
HCT: 39.9 % (ref 36.0–46.0)
Hemoglobin: 12.8 g/dL (ref 12.0–15.0)
Immature Granulocytes: 0 %
Lymphocytes Relative: 13 %
Lymphs Abs: 0.9 10*3/uL (ref 0.7–4.0)
MCH: 28 pg (ref 26.0–34.0)
MCHC: 32.1 g/dL (ref 30.0–36.0)
MCV: 87.3 fL (ref 80.0–100.0)
Monocytes Absolute: 0.6 10*3/uL (ref 0.1–1.0)
Monocytes Relative: 8 %
Neutro Abs: 5.6 10*3/uL (ref 1.7–7.7)
Neutrophils Relative %: 79 %
Platelets: 202 10*3/uL (ref 150–400)
RBC: 4.57 MIL/uL (ref 3.87–5.11)
RDW: 13.2 % (ref 11.5–15.5)
WBC: 7.1 10*3/uL (ref 4.0–10.5)
nRBC: 0 % (ref 0.0–0.2)

## 2019-12-02 LAB — PHOSPHORUS: Phosphorus: 2.7 mg/dL (ref 2.5–4.6)

## 2019-12-02 LAB — FIBRIN DERIVATIVES D-DIMER (ARMC ONLY): Fibrin derivatives D-dimer (ARMC): 662.86 ng/mL (FEU) — ABNORMAL HIGH (ref 0.00–499.00)

## 2019-12-02 LAB — C-REACTIVE PROTEIN: CRP: 5.8 mg/dL — ABNORMAL HIGH (ref <1.0)

## 2019-12-02 LAB — MAGNESIUM: Magnesium: 2.2 mg/dL (ref 1.7–2.4)

## 2019-12-02 NOTE — Progress Notes (Signed)
PROGRESS NOTE    Marissa Luna  OTL:[id number] DOB: [birth year] DOA: 11/30/2019 PCP: Carlean Jews, NP    Assessment & Plan:   Active Problems:   Hypoxia   SOB (shortness of breath)   COVID-19    Marissa Luna is a 63 y.o. AA female with medical history significant of asthma, hypertension and sarcoidosis on home oxygen at 2 L, came to ED with complaint of shortness of breath.    Acute on chronic hypoxic respiratory failure 2/2 Pneumonia due to COVID-19 virus.   Patient with positive COVID-19 test. Desaturating with ambulation, needing 3-4L O2.  Procalcitonin was negative. PLAN: -continue remdesivir and Decadron.  Due to baseline respiratory compromise and risk factors, will plan on finishing all 5 days of Remdesivir. -continue vitamin C and zinc supplement. --combivent and Anoro scheduled  Hypertension. -Continuing home amlodipine.  History of sarcoidosis  Hx of asthma Chronic hypoxic respiratory failure on 2L O2 at baseline No current acute exacerbation. Patient was on low-dose prednisone at home-getting Decadron for 10 days and then restart her home dose of prednisone. Continue oxygen therapy-titrate as needed.   DVT prophylaxis: Lovenox SQ Code Status: Full code  Family Communication: not today Disposition Plan: Home after completing treatment with Remdesivir   Subjective and Interval History:  Pt's breathing continued to improve, and said she only needed suppl O2 when she was up and walking.  Coughing up sputum.  Eating better.  No fever, chest pain, abdominal pain, N/V/D, dysuria, increased swelling.   Objective: Vitals:   12/01/19 1634 12/01/19 1938 12/02/19 0457 12/02/19 0821  BP: 124/61 (!) 142/68 132/71 130/73  Pulse: 91 (!) 103 84 79  Resp: 19   20  Temp: 98.2 F (36.8 C) 97.7 F (36.5 C) 98.4 F (36.9 C) 97.8 F (36.6 C)  TempSrc: Oral Oral Oral Oral  SpO2: 91% 99% 100% 93%  Weight:   71.6 kg   Height:        Intake/Output Summary (Last  24 hours) at 12/02/2019 1604 Last data filed at 12/02/2019 0715 Gross per 24 hour  Intake 0 ml  Output 0 ml  Net 0 ml   Filed Weights   11/30/19 2333 12/01/19 0345 12/02/19 0457  Weight: 72.6 kg 72.5 kg 71.6 kg    Examination:   Constitutional: NAD, AAOx3 HEENT: conjunctivae and lids normal, EOMI CV: RRR no M,R,G. Distal pulses +2.  No cyanosis.   RESP: No wheezes today, better air movement, crackles over right mid posterior lung field, normal respiratory effort, on RA GI: +BS, NTND Extremities: No effusions, edema, or tenderness in BLE SKIN: warm, dry and intact Neuro: II - XII grossly intact.  Sensation intact Psych: Normal mood and affect.  Appropriate judgement and reason   Data Reviewed: I have personally reviewed following labs and imaging studies  CBC: Recent Labs  Lab 11/30/19 0744 12/01/19 0506 12/02/19 0711  WBC 5.4 4.4 7.1  NEUTROABS 3.4 3.4 5.6  HGB 13.0 11.6* 12.8  HCT 41.0 36.0 39.9  MCV 89.5 88.5 87.3  PLT 200 177 202   Basic Metabolic Panel: Recent Labs  Lab 11/30/19 0744 12/01/19 0506 12/02/19 0711  NA 144 143 143  K 4.1 4.4 4.3  CL 107 110 107  CO2 28 25 29   GLUCOSE 107* 122* 116*  BUN 24* 18 22  CREATININE 1.07* 0.76 0.86  CALCIUM 8.7* 8.5* 8.5*  MG  --  2.2 2.2  PHOS  --  2.7 2.7   GFR: Estimated Creatinine Clearance:  57 mL/min (by C-G formula based on SCr of 0.86 mg/dL). Liver Function Tests: Recent Labs  Lab 11/30/19 1259 12/01/19 0506 12/02/19 0711  AST 20 18 19   ALT 15 14 14   ALKPHOS 65 55 59  BILITOT 0.4 0.4 0.4  PROT 7.2 6.5 6.9  ALBUMIN 3.7 3.2* 3.4*   No results for input(s): LIPASE, AMYLASE in the last 168 hours. No results for input(s): AMMONIA in the last 168 hours. Coagulation Profile: No results for input(s): INR, PROTIME in the last 168 hours. Cardiac Enzymes: No results for input(s): CKTOTAL, CKMB, CKMBINDEX, TROPONINI in the last 168 hours. BNP (last 3 results) No results for input(s): PROBNP in the  last 8760 hours. HbA1C: No results for input(s): HGBA1C in the last 72 hours. CBG: No results for input(s): GLUCAP in the last 168 hours. Lipid Profile: No results for input(s): CHOL, HDL, LDLCALC, TRIG, CHOLHDL, LDLDIRECT in the last 72 hours. Thyroid Function Tests: No results for input(s): TSH, T4TOTAL, FREET4, T3FREE, THYROIDAB in the last 72 hours. Anemia Panel: Recent Labs    12/01/19 0506 12/02/19 0711  FERRITIN 128 159   Sepsis Labs: Recent Labs  Lab 11/30/19 0744  PROCALCITON <0.10    No results found for this or any previous visit (from the past 240 hour(s)).    Radiology Studies: No results found.   Scheduled Meds: . amLODipine  10 mg Oral Daily  . vitamin C  500 mg Oral Daily  . calcium-vitamin D  1 tablet Oral Daily  . dexamethasone (DECADRON) injection  6 mg Intravenous Daily  . enoxaparin (LOVENOX) injection  40 mg Subcutaneous Q24H  . fluticasone  1 spray Each Nare Daily  . folic acid  1 mg Oral Daily  . Ipratropium-Albuterol  1 puff Inhalation Q6H  . loratadine  10 mg Oral Daily  . rOPINIRole  0.25 mg Oral QHS  . sodium chloride flush  3 mL Intravenous Q12H  . umeclidinium-vilanterol  1 puff Inhalation Daily  . Vitamin D (Ergocalciferol)  50,000 Units Oral Weekly  . zinc sulfate  220 mg Oral Daily   Continuous Infusions: . remdesivir 100 mg in NS 100 mL 100 mg (12/02/19 0905)     LOS: 2 days     Enzo Bi, MD [facility] If 7PM-7AM, please contact night-coverage 12/02/2019, 4:04 PM

## 2019-12-03 LAB — CBC WITH DIFFERENTIAL/PLATELET
Abs Immature Granulocytes: 0.03 10*3/uL (ref 0.00–0.07)
Basophils Absolute: 0 10*3/uL (ref 0.0–0.1)
Basophils Relative: 0 %
Eosinophils Absolute: 0 10*3/uL (ref 0.0–0.5)
Eosinophils Relative: 0 %
HCT: 38.9 % (ref 36.0–46.0)
Hemoglobin: 12.3 g/dL (ref 12.0–15.0)
Immature Granulocytes: 1 %
Lymphocytes Relative: 15 %
Lymphs Abs: 0.9 10*3/uL (ref 0.7–4.0)
MCH: 28.9 pg (ref 26.0–34.0)
MCHC: 31.6 g/dL (ref 30.0–36.0)
MCV: 91.3 fL (ref 80.0–100.0)
Monocytes Absolute: 0.5 10*3/uL (ref 0.1–1.0)
Monocytes Relative: 9 %
Neutro Abs: 4.5 10*3/uL (ref 1.7–7.7)
Neutrophils Relative %: 75 %
Platelets: 210 10*3/uL (ref 150–400)
RBC: 4.26 MIL/uL (ref 3.87–5.11)
RDW: 13.2 % (ref 11.5–15.5)
WBC: 5.9 10*3/uL (ref 4.0–10.5)
nRBC: 0 % (ref 0.0–0.2)

## 2019-12-03 LAB — COMPREHENSIVE METABOLIC PANEL
ALT: 12 U/L (ref 0–44)
AST: 16 U/L (ref 15–41)
Albumin: 3.1 g/dL — ABNORMAL LOW (ref 3.5–5.0)
Alkaline Phosphatase: 57 U/L (ref 38–126)
Anion gap: 11 (ref 5–15)
BUN: 26 mg/dL — ABNORMAL HIGH (ref 8–23)
CO2: 26 mmol/L (ref 22–32)
Calcium: 8.5 mg/dL — ABNORMAL LOW (ref 8.9–10.3)
Chloride: 106 mmol/L (ref 98–111)
Creatinine, Ser: 0.82 mg/dL (ref 0.44–1.00)
GFR calc Af Amer: >60 mL/min (ref >60)
GFR calc non Af Amer: >60 mL/min (ref >60)
Glucose, Bld: 121 mg/dL — ABNORMAL HIGH (ref 70–99)
Potassium: 4.1 mmol/L (ref 3.5–5.1)
Sodium: 143 mmol/L (ref 135–145)
Total Bilirubin: 0.5 mg/dL (ref 0.3–1.2)
Total Protein: 6.5 g/dL (ref 6.5–8.1)

## 2019-12-03 LAB — MAGNESIUM: Magnesium: 2.4 mg/dL (ref 1.7–2.4)

## 2019-12-03 LAB — FIBRIN DERIVATIVES D-DIMER (ARMC ONLY): Fibrin derivatives D-dimer (ARMC): 459.47 ng/mL (FEU) (ref 0.00–499.00)

## 2019-12-03 LAB — PHOSPHORUS: Phosphorus: 2.8 mg/dL (ref 2.5–4.6)

## 2019-12-03 LAB — C-REACTIVE PROTEIN: CRP: 3.7 mg/dL — ABNORMAL HIGH (ref <1.0)

## 2019-12-03 LAB — FERRITIN: Ferritin: 144 ng/mL (ref 11–307)

## 2019-12-03 NOTE — Progress Notes (Signed)
PROGRESS NOTE    Marissa Luna  [id number]:[id number] DOB: 10/28/1956 DOA: 11/30/2019 PCP: Carlean Jews, NP    Assessment & Plan:   Active Problems:   Hypoxia   SOB (shortness of breath)   COVID-19    Marissa Luna is a 64 y.o. AA female with medical history significant of asthma, hypertension and sarcoidosis on home oxygen at 2 L, came to ED with complaint of shortness of breath.    Acute on chronic hypoxic respiratory failure 2/2 Pneumonia due to COVID-19 virus.   Patient with positive COVID-19 test. Desaturating with ambulation, needing 3-4L O2.  Procalcitonin was negative. PLAN: -continue remdesivir and Decadron day 4.  Due to baseline respiratory compromise and risk factors, will plan on finishing all 5 days of Remdesivir (last day 12/30) -continue vitamin C and zinc supplement. --combivent and Anoro scheduled  Hypertension. -Continuing home amlodipine.  History of sarcoidosis  Hx of asthma Chronic hypoxic respiratory failure on 2L O2 at baseline No current acute exacerbation. Patient was on low-dose prednisone at home-getting Decadron while inpatient, then restart her home dose of prednisone after discharge. Continue oxygen therapy-titrate as needed.   DVT prophylaxis: Lovenox SQ Code Status: Full code  Family Communication: not today Disposition Plan: Home tomorrow after 5th dose of Remdesivir  Subjective and Interval History:  Pt reported doing well, has been walking around in her room.  Continued to cough up sputum.  No fever, chest pain, abdominal pain, N/V/D, dysuria, increased swelling.   Objective: Vitals:   12/03/19 0416 12/03/19 0807 12/03/19 0853 12/03/19 1609  BP: 116/68 129/77  131/72  Pulse: 77 85  84  Resp: 20 18  18   Temp: 98.9 F (37.2 C) 98.6 F (37 C)  98.7 F (37.1 C)  TempSrc: Oral Oral  Oral  SpO2: 99% 91% 93% 98%  Weight:      Height:        Intake/Output Summary (Last 24 hours) at 12/03/2019 1824 Last data filed at 12/03/2019  1000 Gross per 24 hour  Intake 100 ml  Output 0 ml  Net 100 ml   Filed Weights   11/30/19 2333 12/01/19 0345 12/02/19 0457  Weight: 72.6 kg 72.5 kg 71.6 kg    Examination:   Constitutional: NAD, AAOx3 HEENT: conjunctivae and lids normal, EOMI CV: RRR no M,R,G. Distal pulses +2.  No cyanosis.   RESP: No wheezes today, good air movement, crackles over right mid posterior lung field, normal respiratory effort, on 2L GI: +BS, NTND Extremities: No effusions, edema, or tenderness in BLE SKIN: warm, dry and intact Neuro: II - XII grossly intact.  Sensation intact Psych: Normal mood and affect.  Appropriate judgement and reason   Data Reviewed: I have personally reviewed following labs and imaging studies  CBC: Recent Labs  Lab 11/30/19 0744 12/01/19 0506 12/02/19 0711 12/03/19 0451  WBC 5.4 4.4 7.1 5.9  NEUTROABS 3.4 3.4 5.6 4.5  HGB 13.0 11.6* 12.8 12.3  HCT 41.0 36.0 39.9 38.9  MCV 89.5 88.5 87.3 91.3  PLT 200 177 202 210   Basic Metabolic Panel: Recent Labs  Lab 11/30/19 0744 12/01/19 0506 12/02/19 0711 12/03/19 0451  NA 144 143 143 143  K 4.1 4.4 4.3 4.1  CL 107 110 107 106  CO2 28 25 29 26   GLUCOSE 107* 122* 116* 121*  BUN 24* 18 22 26*  CREATININE 1.07* 0.76 0.86 0.82  CALCIUM 8.7* 8.5* 8.5* 8.5*  MG  --  2.2 2.2 2.4  PHOS  --  2.7 2.7 2.8   GFR: Estimated Creatinine Clearance: 59.7 mL/min (by C-G formula based on SCr of 0.82 mg/dL). Liver Function Tests: Recent Labs  Lab 11/30/19 1259 12/01/19 0506 12/02/19 0711 12/03/19 0451  AST 20 18 19 16   ALT 15 14 14 12   ALKPHOS 65 55 59 57  BILITOT 0.4 0.4 0.4 0.5  PROT 7.2 6.5 6.9 6.5  ALBUMIN 3.7 3.2* 3.4* 3.1*   No results for input(s): LIPASE, AMYLASE in the last 168 hours. No results for input(s): AMMONIA in the last 168 hours. Coagulation Profile: No results for input(s): INR, PROTIME in the last 168 hours. Cardiac Enzymes: No results for input(s): CKTOTAL, CKMB, CKMBINDEX, TROPONINI in the  last 168 hours. BNP (last 3 results) No results for input(s): PROBNP in the last 8760 hours. HbA1C: No results for input(s): HGBA1C in the last 72 hours. CBG: No results for input(s): GLUCAP in the last 168 hours. Lipid Profile: No results for input(s): CHOL, HDL, LDLCALC, TRIG, CHOLHDL, LDLDIRECT in the last 72 hours. Thyroid Function Tests: No results for input(s): TSH, T4TOTAL, FREET4, T3FREE, THYROIDAB in the last 72 hours. Anemia Panel: Recent Labs    12/02/19 0711 12/03/19 0451  FERRITIN 159 144   Sepsis Labs: Recent Labs  Lab 11/30/19 0744  PROCALCITON <0.10    No results found for this or any previous visit (from the past 240 hour(s)).    Radiology Studies: No results found.   Scheduled Meds: . amLODipine  10 mg Oral Daily  . vitamin C  500 mg Oral Daily  . calcium-vitamin D  1 tablet Oral Daily  . dexamethasone (DECADRON) injection  6 mg Intravenous Daily  . enoxaparin (LOVENOX) injection  40 mg Subcutaneous Q24H  . fluticasone  1 spray Each Nare Daily  . folic acid  1 mg Oral Daily  . Ipratropium-Albuterol  1 puff Inhalation Q6H  . loratadine  10 mg Oral Daily  . rOPINIRole  0.25 mg Oral QHS  . sodium chloride flush  3 mL Intravenous Q12H  . umeclidinium-vilanterol  1 puff Inhalation Daily  . Vitamin D (Ergocalciferol)  50,000 Units Oral Weekly  . zinc sulfate  220 mg Oral Daily   Continuous Infusions: . remdesivir 100 mg in NS 100 mL 100 mg (12/03/19 0906)     LOS: 3 days     Enzo Bi, MD [facility] If 7PM-7AM, please contact night-coverage 12/03/2019, 6:24 PM

## 2019-12-03 NOTE — Plan of Care (Signed)
  Problem: Education: Goal: Knowledge of risk factors and measures for prevention of condition will improve Outcome: Progressing   Problem: Coping: Goal: Psychosocial and spiritual needs will be supported Outcome: Progressing   Problem: Respiratory: Goal: Will maintain a patent airway Outcome: Progressing Goal: Complications related to the disease process, condition or treatment will be avoided or minimized Outcome: Progressing   

## 2019-12-04 LAB — FIBRIN DERIVATIVES D-DIMER (ARMC ONLY): Fibrin derivatives D-dimer (ARMC): 465.54 ng/mL (FEU) (ref 0.00–499.00)

## 2019-12-04 LAB — COMPREHENSIVE METABOLIC PANEL
ALT: 12 U/L (ref 0–44)
AST: 15 U/L (ref 15–41)
Albumin: 3.1 g/dL — ABNORMAL LOW (ref 3.5–5.0)
Alkaline Phosphatase: 54 U/L (ref 38–126)
Anion gap: 7 (ref 5–15)
BUN: 31 mg/dL — ABNORMAL HIGH (ref 8–23)
CO2: 28 mmol/L (ref 22–32)
Calcium: 8.6 mg/dL — ABNORMAL LOW (ref 8.9–10.3)
Chloride: 106 mmol/L (ref 98–111)
Creatinine, Ser: 1.04 mg/dL — ABNORMAL HIGH (ref 0.44–1.00)
GFR calc Af Amer: >60 mL/min (ref >60)
GFR calc non Af Amer: 57 mL/min — ABNORMAL LOW (ref >60)
Glucose, Bld: 177 mg/dL — ABNORMAL HIGH (ref 70–99)
Potassium: 4.6 mmol/L (ref 3.5–5.1)
Sodium: 141 mmol/L (ref 135–145)
Total Bilirubin: 0.6 mg/dL (ref 0.3–1.2)
Total Protein: 6.6 g/dL (ref 6.5–8.1)

## 2019-12-04 LAB — CBC WITH DIFFERENTIAL/PLATELET
Abs Immature Granulocytes: 0.04 10*3/uL (ref 0.00–0.07)
Basophils Absolute: 0 10*3/uL (ref 0.0–0.1)
Basophils Relative: 0 %
Eosinophils Absolute: 0 10*3/uL (ref 0.0–0.5)
Eosinophils Relative: 0 %
HCT: 40.1 % (ref 36.0–46.0)
Hemoglobin: 13 g/dL (ref 12.0–15.0)
Immature Granulocytes: 1 %
Lymphocytes Relative: 12 %
Lymphs Abs: 0.8 10*3/uL (ref 0.7–4.0)
MCH: 28.4 pg (ref 26.0–34.0)
MCHC: 32.4 g/dL (ref 30.0–36.0)
MCV: 87.7 fL (ref 80.0–100.0)
Monocytes Absolute: 0.7 10*3/uL (ref 0.1–1.0)
Monocytes Relative: 10 %
Neutro Abs: 5.3 10*3/uL (ref 1.7–7.7)
Neutrophils Relative %: 77 %
Platelets: 222 10*3/uL (ref 150–400)
RBC: 4.57 MIL/uL (ref 3.87–5.11)
RDW: 12.9 % (ref 11.5–15.5)
WBC: 6.8 10*3/uL (ref 4.0–10.5)
nRBC: 0 % (ref 0.0–0.2)

## 2019-12-04 LAB — C-REACTIVE PROTEIN: CRP: 2.6 mg/dL — ABNORMAL HIGH (ref <1.0)

## 2019-12-04 LAB — PHOSPHORUS: Phosphorus: 3.3 mg/dL (ref 2.5–4.6)

## 2019-12-04 LAB — FERRITIN: Ferritin: 133 ng/mL (ref 11–307)

## 2019-12-04 LAB — MAGNESIUM: Magnesium: 2.4 mg/dL (ref 1.7–2.4)

## 2019-12-04 MED ORDER — DEXAMETHASONE 6 MG PO TABS: 6.0000 mg | ORAL_TABLET | Freq: Every day | ORAL | 0 refills | Status: AC

## 2019-12-04 MED ORDER — GUAIFENESIN-DM 100-10 MG/5ML PO SYRP: 10.0000 mL | ORAL_SOLUTION | ORAL | 0 refills | Status: AC | PRN

## 2019-12-04 MED ORDER — ASCORBIC ACID 500 MG PO TABS: 500.0000 mg | ORAL_TABLET | Freq: Every day | ORAL | 0 refills | Status: DC

## 2019-12-04 MED ORDER — FOLIC ACID 1 MG PO TABS: 1.0000 mg | ORAL_TABLET | Freq: Every day | ORAL | 0 refills | Status: DC

## 2019-12-04 NOTE — Progress Notes (Signed)
Ch spoke with pt via telephone to see how well the pt was progressing. Pt is a 66 YOF that is CV19+ that was admitted due to c/o SOB. Pt shared that she is O2 dependant at home and thinks she had been exposed during her time running errands on Xmas Eve. Pt is hopeful to be d/c soon as she shared that she responded well to the antibiotic trt she received while she has been admitted. Ch provided words of comfort to pt.    12/04/19 1100  Clinical Encounter Type  Visited With Patient  Visit Type Spiritual support;Social support  Spiritual Encounters  Spiritual Needs Emotional;Grief support  Stress Factors  Patient Stress Factors Health changes  Family Stress Factors None identified

## 2019-12-04 NOTE — Discharge Summary (Signed)
Physician Discharge Summary  Marissa MiyamotoKatie S Luna [id number] DOB: 03/24/1956 DOA: 11/30/2019  PCP: Carlean JewsBoscia, Heather E, NP  Admit date: 11/30/2019 Discharge date: 12/04/2019  Admitted From: Home Disposition: Home  Recommendations for Outpatient Follow-up:  1. Follow up with PCP in 1-2 weeks   Home Health: No Equipment/Devices: Oxygen, 2 L  Discharge Condition: Stable CODE STATUS:Full Diet recommendation: Regular  Brief/Interim Summary: Marissa OtaKatie S Mooreis a 63 y.o.AA femalewith medical history significant ofasthma, hypertension and sarcoidosis on home oxygen at 2 L,came to ED with complaint of shortness of breath.  Patient completed 5 days of remdesivir and directed drawn therapy in-house.  Respiratory status improved.  On day of discharge patient was at home rate of 2 L of oxygen.  She is able to speak without becoming short of breath.  She is stable for discharge. clear isolation instructions provided prior to discharge.  Discharge Diagnoses:  Active Problems:   Hypoxia   SOB (shortness of breath)   COVID-19  Acute on chronic hypoxic respiratory failure 2/2 Pneumonia due to COVID-19 virus. Patient with positive COVID-19 test. Desaturating with ambulation, needing 3-4L O2.   Procalcitonin was negative. PLAN: -Completed 5 days of remdesivir and Decadron therapy in house -Prescribed additional 5 days of dexamethasone 6 mg daily on discharge -Patient to resume home inhaler and nebulizer regimen -Patient can resume baseline 2 L of oxygen at home  Hypertension. -Continuing home amlodipine.  History of sarcoidosis  Hx of asthma Chronic hypoxic respiratory failure on 2L O2 at baseline No current acute exacerbation. Patient is instructed to complete the 6 mg Decadron x5-day course She can then return to her home dose of prednisone  Discharge Instructions  Discharge Instructions    Diet - low sodium heart healthy   Complete by: As directed    Discharge instructions    Complete by: As directed    Take dexamethasone 6mg  tablet once daily for 5 days starting tomorrow, then you can switch back to your regular home prednisone dose   Increase activity slowly   Complete by: As directed    MyChart COVID-19 home monitoring program   Complete by: Dec 04, 2019    Is the patient willing to use the MyChart Mobile App for home monitoring?: Yes   Temperature monitoring   Complete by: Dec 04, 2019    After how many days would you like to receive a notification of this patient's flowsheet entries?: 1     Allergies as of 12/04/2019   No Known Allergies     Medication List    STOP taking these medications   ALPRAZolam 0.25 MG tablet Commonly known as: XANAX   cyclobenzaprine 5 MG tablet Commonly known as: FLEXERIL   ibuprofen 800 MG tablet Commonly known as: ADVIL     TAKE these medications   albuterol (2.5 MG/3ML) 0.083% nebulizer solution Commonly known as: PROVENTIL Take 3 mLs (2.5 mg total) by nebulization every 6 (six) hours as needed.   albuterol 108 (90 Base) MCG/ACT inhaler Commonly known as: VENTOLIN HFA INHALE 2 PUFFS 4 TIMES A DAY AS NEEDED FOR WHEEZING   amLODipine 10 MG tablet Commonly known as: NORVASC Take 1 tablet (10 mg total) by mouth daily.   ascorbic acid 500 MG tablet Commonly known as: VITAMIN C Take 1 tablet (500 mg total) by mouth daily for 10 days. Start taking on: December 05, 2019   Calcium Carbonate-Vitamin D 600-400 MG-UNIT tablet Take 1 tablet by mouth daily.   cetirizine 10 MG tablet Commonly known as:  ZYRTEC Take 10 mg by mouth.   dexamethasone 6 MG tablet Commonly known as: Decadron Take 1 tablet (6 mg total) by mouth daily for 5 days. Start taking on: December 05, 2019   fluticasone 50 MCG/ACT nasal spray Commonly known as: FLONASE Place 2 sprays into the nose daily.   folic acid 1 MG tablet Commonly known as: FOLVITE Take 1 mg by mouth. What changed: Another medication with the same name was added.  Make sure you understand how and when to take each.   folic acid 1 MG tablet Commonly known as: FOLVITE Take 1 tablet (1 mg total) by mouth daily for 10 days. Start taking on: December 05, 2019 What changed: You were already taking a medication with the same name, and this prescription was added. Make sure you understand how and when to take each.   guaiFENesin-dextromethorphan 100-10 MG/5ML syrup Commonly known as: ROBITUSSIN DM Take 10 mLs by mouth every 4 (four) hours as needed for cough.   OXYGEN Inhale 2 L into the lungs.   predniSONE 5 MG tablet Commonly known as: DELTASONE TAKE 1 TABLET BY MOUTH EVERY DAY WITH BREAKFAST   rOPINIRole 0.25 MG tablet Commonly known as: REQUIP Take 1 tablet (0.25 mg total) by mouth at bedtime.   Trelegy Ellipta 100-62.5-25 MCG/INH Aepb Generic drug: Fluticasone-Umeclidin-Vilant Inhale 1 puff into the lungs daily.   Vitamin D (Ergocalciferol) 1.25 MG (50000 UT) Caps capsule Commonly known as: DRISDOL Take 1 capsule (50,000 Units total) by mouth once a week.       No Known Allergies  Consultations: None  Procedures/Studies: DG Chest 2 View  Result Date: 11/30/2019 CLINICAL DATA:  Shortness of breath and cough. EXAM: CHEST - 2 VIEW COMPARISON:  February 12, 2018 FINDINGS: The mediastinal contour and cardiac silhouette are normal. Patchy opacities are identified throughout bilateral lungs. 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 No acute abnormalities identified in visualized bony structures. IMPRESSION: 1. Patchy opacities are identified throughout bilateral lungs suspicious for pneumonia. 2. 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 Electronically Signed   By: Sherian Rein M.D.   On: 11/30/2019 08:43      Subjective: Seen and examined the day of discharge No acute events over interval No new complaints  Discharge Exam: Vitals:   12/04/19 0446 12/04/19 0801  BP: 129/77 115/63   Pulse: 78 72  Resp:  16  Temp: 98.2 F (36.8 C) 98 F (36.7 C)  SpO2: 98% 98%   Vitals:   12/03/19 1609 12/03/19 2030 12/04/19 0446 12/04/19 0801  BP: 131/72 136/68 129/77 115/63  Pulse: 84 81 78 72  Resp: 18   16  Temp: 98.7 F (37.1 C) 98.2 F (36.8 C) 98.2 F (36.8 C) 98 F (36.7 C)  TempSrc: Oral Oral Oral Oral  SpO2: 98% 99% 98% 98%  Weight:   71.4 kg   Height:        General: Pt is alert, awake, not in acute distress Cardiovascular: RRR, S1/S2 +, no rubs, no gallops Respiratory: Scattered crackles with good air movement bilaterally Abdominal: Soft, NT, ND, bowel sounds + Extremities: no edema, no cyanosis    The results of significant diagnostics from this hospitalization (including imaging, microbiology, ancillary and laboratory) are listed below for reference.     Microbiology: No results found for this or any previous visit (from the past 240 hour(s)).   Labs: BNP (last 3 results) No results for input(s): BNP in the last 8760 hours. Basic  Metabolic Panel: Recent Labs  Lab 11/30/19 0744 12/01/19 0506 12/02/19 0711 12/03/19 0451 12/04/19 0622  NA 144 143 143 143 141  K 4.1 4.4 4.3 4.1 4.6  CL 107 110 107 106 106  CO2 28 25 29 26 28   GLUCOSE 107* 122* 116* 121* 177*  BUN 24* 18 22 26* 31*  CREATININE 1.07* 0.76 0.86 0.82 1.04*  CALCIUM 8.7* 8.5* 8.5* 8.5* 8.6*  MG  --  2.2 2.2 2.4 2.4  PHOS  --  2.7 2.7 2.8 3.3   Liver Function Tests: Recent Labs  Lab 11/30/19 1259 12/01/19 0506 12/02/19 0711 12/03/19 0451 12/04/19 0622  AST 20 18 19 16 15   ALT 15 14 14 12 12   ALKPHOS 65 55 59 57 54  BILITOT 0.4 0.4 0.4 0.5 0.6  PROT 7.2 6.5 6.9 6.5 6.6  ALBUMIN 3.7 3.2* 3.4* 3.1* 3.1*   No results for input(s): LIPASE, AMYLASE in the last 168 hours. No results for input(s): AMMONIA in the last 168 hours. CBC: Recent Labs  Lab 11/30/19 0744 12/01/19 0506 12/02/19 0711 12/03/19 0451 12/04/19 0622  WBC 5.4 4.4 7.1 5.9 6.8  NEUTROABS 3.4 3.4  5.6 4.5 5.3  HGB 13.0 11.6* 12.8 12.3 13.0  HCT 41.0 36.0 39.9 38.9 40.1  MCV 89.5 88.5 87.3 91.3 87.7  PLT 200 177 202 210 222   Cardiac Enzymes: No results for input(s): CKTOTAL, CKMB, CKMBINDEX, TROPONINI in the last 168 hours. BNP: Invalid input(s): POCBNP CBG: No results for input(s): GLUCAP in the last 168 hours. D-Dimer No results for input(s): DDIMER in the last 72 hours. Hgb A1c No results for input(s): HGBA1C in the last 72 hours. Lipid Profile No results for input(s): CHOL, HDL, LDLCALC, TRIG, CHOLHDL, LDLDIRECT in the last 72 hours. Thyroid function studies No results for input(s): TSH, T4TOTAL, T3FREE, THYROIDAB in the last 72 hours.  Invalid input(s): FREET3 Anemia work up Recent Labs    12/03/19 0451 12/04/19 0622  FERRITIN 144 133   Urinalysis    Component Value Date/Time   COLORURINE YELLOW (A) 01/11/2018 2100   APPEARANCEUR Clear 06/25/2019 0920   LABSPEC 1.010 01/11/2018 2100   PHURINE 6.0 01/11/2018 2100   GLUCOSEU Negative 06/25/2019 0920   HGBUR SMALL (A) 01/11/2018 2100   BILIRUBINUR Negative 06/25/2019 0920   KETONESUR NEGATIVE 01/11/2018 2100   PROTEINUR Negative 06/25/2019 0920   PROTEINUR NEGATIVE 01/11/2018 2100   NITRITE Negative 06/25/2019 0920   NITRITE NEGATIVE 01/11/2018 2100   LEUKOCYTESUR Negative 06/25/2019 0920   Sepsis Labs Invalid input(s): PROCALCITONIN,  WBC,  LACTICIDVEN Microbiology No results found for this or any previous visit (from the past 240 hour(s)).   Time coordinating discharge: Over 30 minutes  SIGNED:   Sidney Ace, MD  [facility] 12/04/2019, 4:41 PM Pager [phone]  If 7PM-7AM, please contact night-coverage [email or web address] Password [email or web address]

## 2019-12-04 NOTE — Progress Notes (Signed)
IV and tele removed from patient. Discharge instructions given to patient. Verbalized understanding. No acute distress at this time. Husband to transport patient home.  

## 2019-12-10 ENCOUNTER — Other Ambulatory Visit: Payer: Self-pay

## 2019-12-10 ENCOUNTER — Encounter: Payer: Self-pay | Admitting: Adult Health

## 2019-12-10 ENCOUNTER — Ambulatory Visit (INDEPENDENT_AMBULATORY_CARE_PROVIDER_SITE_OTHER): Payer: 59 | Admitting: Adult Health

## 2019-12-10 VITALS — Temp 97.0°F

## 2019-12-10 DIAGNOSIS — J988 Other specified respiratory disorders: Secondary | ICD-10-CM | POA: Diagnosis not present

## 2019-12-10 DIAGNOSIS — Z9981 Dependence on supplemental oxygen: Secondary | ICD-10-CM | POA: Diagnosis not present

## 2019-12-10 DIAGNOSIS — I10 Essential (primary) hypertension: Secondary | ICD-10-CM

## 2019-12-10 MED ORDER — AZITHROMYCIN 250 MG PO TABS: ORAL_TABLET | ORAL | 0 refills | Status: DC

## 2019-12-10 NOTE — Progress Notes (Signed)
[facility] [address] [place], [place] [address]  Internal MEDICINE  Telephone Visit  Patient Name: Marissa Luna  [id number]  [id number]  Date of Service: 12/10/2019  I connected with the patient at 1018 by telephone and verified the patients identity using two identifiers.   I discussed the limitations, risks, security and privacy concerns of performing an evaluation and management service by telephone and the availability of in person appointments. I also discussed with the patient that there may be a patient responsible charge related to the service.  The patient expressed understanding and agrees to proceed.    Chief Complaint  Patient presents with  . Telephone Assessment  . Telephone Screen  . Hospitalization Follow-up    covid positive     HPI  Pt seen via telephone.  She reports she woke up on December 26th, and was having trouble breathing. She went to the ER.  She had pneumonia, and tested positive for corona. She was admitted for 5 days.  She was discharged with Rx's for Folic Acid, Dexamethasone and Ascorbic acid.  She reports her productive cough is still present.  She reports brown phlegm.  Hospital course copied from Dr. Nena Alexander discharge summary on 12/04/2019:  "Acute on chronic hypoxic respiratory failure 2/2 Pneumonia due to COVID-19 virus. Patient with positive COVID-19 test. Desaturating with ambulation, needing 3-4L O2.  Procalcitonin was negative. PLAN: -Completed 5 days of remdesivir and Decadron therapy in house -Prescribed additional 5 days of dexamethasone 6 mg daily on discharge -Patient to resume home inhaler and nebulizer regimen -Patient can resume baseline 2 L of oxygen at home  Hypertension. -Continuing home amlodipine.  History of sarcoidosis  Hx of asthma Chronic hypoxic respiratory failure on 2L O2 at baseline No current acute exacerbation. Patient is instructed to complete the 6 mg Decadron x5-day course She can then  return to her home dose of prednisone"   Current Medication: Outpatient Encounter Medications as of 12/10/2019  Medication Sig Note  . albuterol (PROVENTIL) (2.5 MG/3ML) 0.083% nebulizer solution Take 3 mLs (2.5 mg total) by nebulization every 6 (six) hours as needed.   Marland Kitchen albuterol (VENTOLIN HFA) 108 (90 Base) MCG/ACT inhaler INHALE 2 PUFFS 4 TIMES A DAY AS NEEDED FOR WHEEZING   . amLODipine (NORVASC) 10 MG tablet Take 1 tablet (10 mg total) by mouth daily.   Marland Kitchen ascorbic acid (VITAMIN C) 500 MG tablet Take 1 tablet (500 mg total) by mouth daily for 10 days.   . Calcium Carbonate-Vitamin D 600-400 MG-UNIT tablet Take 1 tablet by mouth daily.    . cetirizine (ZYRTEC) 10 MG tablet Take 10 mg by mouth.   . dexamethasone (DECADRON) 6 MG tablet Take 1 tablet (6 mg total) by mouth daily for 5 days.   . fluticasone (FLONASE) 50 MCG/ACT nasal spray Place 2 sprays into the nose daily.    . folic acid (FOLVITE) 1 MG tablet Take 1 mg by mouth.   . folic acid (FOLVITE) 1 MG tablet Take 1 tablet (1 mg total) by mouth daily for 10 days.   Marland Kitchen guaiFENesin-dextromethorphan (ROBITUSSIN DM) 100-10 MG/5ML syrup Take 10 mLs by mouth every 4 (four) hours as needed for cough.   . OXYGEN Inhale 2 L into the lungs.   . predniSONE (DELTASONE) 5 MG tablet TAKE 1 TABLET BY MOUTH EVERY DAY WITH BREAKFAST   . rOPINIRole (REQUIP) 0.25 MG tablet Take 1 tablet (0.25 mg total) by mouth at bedtime.   Harrel Carina ELLIPTA 100-62.5-25 MCG/INH  AEPB Inhale 1 puff into the lungs daily.   . Vitamin D, Ergocalciferol, (DRISDOL) 1.25 MG (50000 UT) CAPS capsule Take 1 capsule (50,000 Units total) by mouth once a week. 11/30/2019: Takes on Sunday   . azithromycin (ZITHROMAX) 250 MG tablet Take as directed    No facility-administered encounter medications on file as of 12/10/2019.    Surgical History: Past Surgical History:  Procedure Laterality Date  . CHOLECYSTECTOMY    . COLONOSCOPY N/A 10/26/2015   Procedure: COLONOSCOPY;  Surgeon:  Wallace Cullens, MD;  Location: [facility] ENDOSCOPY;  Service: Gastroenterology;  Laterality: N/A;    Medical History: Past Medical History:  Diagnosis Date  . Asthma   . Environmental allergies   . Hypertension   . Sarcoidosis     Family History: Family History  Problem Relation Age of Onset  . Lung cancer Mother   . Colon cancer Father   . Diabetes Sister   . Breast cancer Sister     Social History   Socioeconomic History  . Marital status: Married    Spouse name: Not on file  . Number of children: Not on file  . Years of education: Not on file  . Highest education level: Not on file  Occupational History  . Not on file  Tobacco Use  . Smoking status: Never Smoker  . Smokeless tobacco: Never Used  Substance and Sexual Activity  . Alcohol use: No  . Drug use: No  . Sexual activity: Not on file  Other Topics Concern  . Not on file  Social History Narrative  . Not on file   Social Determinants of Health   Financial Resource Strain:   . Difficulty of Paying Living Expenses: Not on file  Food Insecurity:   . Worried About Programme researcher, broadcasting/film/video in the Last Year: Not on file  . Ran Out of Food in the Last Year: Not on file  Transportation Needs:   . Lack of Transportation (Medical): Not on file  . Lack of Transportation (Non-Medical): Not on file  Physical Activity:   . Days of Exercise per Week: Not on file  . Minutes of Exercise per Session: Not on file  Stress:   . Feeling of Stress : Not on file  Social Connections:   . Frequency of Communication with Friends and Family: Not on file  . Frequency of Social Gatherings with Friends and Family: Not on file  . Attends Religious Services: Not on file  . Active Member of Clubs or Organizations: Not on file  . Attends Banker Meetings: Not on file  . Marital Status: Not on file  Intimate Partner Violence:   . Fear of Current or Ex-Partner: Not on file  . Emotionally Abused: Not on file  . Physically Abused:  Not on file  . Sexually Abused: Not on file      Review of Systems  Constitutional: Negative for chills, fatigue and unexpected weight change.  HENT: Negative for congestion, rhinorrhea, sneezing and sore throat.   Eyes: Negative for photophobia, pain and redness.  Respiratory: Negative for cough, chest tightness and shortness of breath.   Cardiovascular: Negative for chest pain and palpitations.  Gastrointestinal: Negative for abdominal pain, constipation, diarrhea, nausea and vomiting.  Endocrine: Negative.   Genitourinary: Negative for dysuria and frequency.  Musculoskeletal: Negative for arthralgias, back pain, joint swelling and neck pain.  Skin: Negative for rash.  Allergic/Immunologic: Negative.   Neurological: Negative for tremors and numbness.  Hematological: Negative for  adenopathy. Does not bruise/bleed easily.  Psychiatric/Behavioral: Negative for behavioral problems and sleep disturbance. The patient is not nervous/anxious.     Vital Signs: Temp (!) 97 F (36.1 C)    Observation/Objective: Coughing on phone, NAD.   Assessment/Plan: 1. Respiratory infection Advised patient to take entire course of antibiotics as prescribed with food. Pt should return to clinic in 7-10 days if symptoms fail to improve or new symptoms develop.  - azithromycin (ZITHROMAX) 250 MG tablet; Take as directed  Dispense: 6 tablet; Refill: 0  2. Essential hypertension Controlled, continue current management.  3. Oxygen dependent Continue to use oxygen as directed 2 LPM  General Counseling: Belkys verbalizes understanding of the findings of today's phone visit and agrees with plan of treatment. I have discussed any further diagnostic evaluation that may be needed or ordered today. We also reviewed her medications today. she has been encouraged to call the office with any questions or concerns that should arise related to todays visit.    No orders of the defined types were placed in this  encounter.   Meds ordered this encounter  Medications  . azithromycin (ZITHROMAX) 250 MG tablet    Sig: Take as directed    Dispense:  6 tablet    Refill:  0    Time spent: [place] AGNP-C Internal medicine

## 2019-12-13 ENCOUNTER — Inpatient Hospital Stay
Admission: EM | Admit: 2019-12-13 | Discharge: 2020-02-03 | DRG: 193 | Disposition: E | Payer: 59 | Attending: Internal Medicine | Admitting: Internal Medicine

## 2019-12-13 ENCOUNTER — Other Ambulatory Visit: Payer: Self-pay

## 2019-12-13 ENCOUNTER — Emergency Department: Payer: 59

## 2019-12-13 DIAGNOSIS — J470 Bronchiectasis with acute lower respiratory infection: Secondary | ICD-10-CM | POA: Diagnosis present

## 2019-12-13 DIAGNOSIS — J189 Pneumonia, unspecified organism: Secondary | ICD-10-CM

## 2019-12-13 DIAGNOSIS — R531 Weakness: Secondary | ICD-10-CM

## 2019-12-13 DIAGNOSIS — F419 Anxiety disorder, unspecified: Secondary | ICD-10-CM | POA: Diagnosis present

## 2019-12-13 DIAGNOSIS — Z79899 Other long term (current) drug therapy: Secondary | ICD-10-CM

## 2019-12-13 DIAGNOSIS — Z833 Family history of diabetes mellitus: Secondary | ICD-10-CM

## 2019-12-13 DIAGNOSIS — Z7189 Other specified counseling: Secondary | ICD-10-CM

## 2019-12-13 DIAGNOSIS — R0902 Hypoxemia: Secondary | ICD-10-CM

## 2019-12-13 DIAGNOSIS — J181 Lobar pneumonia, unspecified organism: Secondary | ICD-10-CM | POA: Diagnosis not present

## 2019-12-13 DIAGNOSIS — J8410 Pulmonary fibrosis, unspecified: Secondary | ICD-10-CM | POA: Diagnosis present

## 2019-12-13 DIAGNOSIS — Z66 Do not resuscitate: Secondary | ICD-10-CM | POA: Diagnosis not present

## 2019-12-13 DIAGNOSIS — Z515 Encounter for palliative care: Secondary | ICD-10-CM | POA: Diagnosis not present

## 2019-12-13 DIAGNOSIS — J8489 Other specified interstitial pulmonary diseases: Secondary | ICD-10-CM | POA: Diagnosis present

## 2019-12-13 DIAGNOSIS — R0602 Shortness of breath: Secondary | ICD-10-CM

## 2019-12-13 DIAGNOSIS — B948 Sequelae of other specified infectious and parasitic diseases: Secondary | ICD-10-CM

## 2019-12-13 DIAGNOSIS — M5430 Sciatica, unspecified side: Secondary | ICD-10-CM | POA: Diagnosis present

## 2019-12-13 DIAGNOSIS — G2581 Restless legs syndrome: Secondary | ICD-10-CM | POA: Diagnosis present

## 2019-12-13 DIAGNOSIS — Y95 Nosocomial condition: Secondary | ICD-10-CM | POA: Diagnosis present

## 2019-12-13 DIAGNOSIS — J188 Other pneumonia, unspecified organism: Secondary | ICD-10-CM

## 2019-12-13 DIAGNOSIS — D860 Sarcoidosis of lung: Secondary | ICD-10-CM | POA: Diagnosis present

## 2019-12-13 DIAGNOSIS — Z9981 Dependence on supplemental oxygen: Secondary | ICD-10-CM

## 2019-12-13 DIAGNOSIS — T380X5A Adverse effect of glucocorticoids and synthetic analogues, initial encounter: Secondary | ICD-10-CM | POA: Diagnosis present

## 2019-12-13 DIAGNOSIS — J9621 Acute and chronic respiratory failure with hypoxia: Secondary | ICD-10-CM

## 2019-12-13 DIAGNOSIS — Z800 Family history of malignant neoplasm of digestive organs: Secondary | ICD-10-CM

## 2019-12-13 DIAGNOSIS — M25559 Pain in unspecified hip: Secondary | ICD-10-CM

## 2019-12-13 DIAGNOSIS — J9600 Acute respiratory failure, unspecified whether with hypoxia or hypercapnia: Secondary | ICD-10-CM | POA: Diagnosis not present

## 2019-12-13 DIAGNOSIS — I10 Essential (primary) hypertension: Secondary | ICD-10-CM | POA: Diagnosis present

## 2019-12-13 DIAGNOSIS — Z803 Family history of malignant neoplasm of breast: Secondary | ICD-10-CM

## 2019-12-13 DIAGNOSIS — E119 Type 2 diabetes mellitus without complications: Secondary | ICD-10-CM

## 2019-12-13 DIAGNOSIS — J9601 Acute respiratory failure with hypoxia: Secondary | ICD-10-CM

## 2019-12-13 DIAGNOSIS — Z8616 Personal history of COVID-19: Secondary | ICD-10-CM

## 2019-12-13 DIAGNOSIS — E1165 Type 2 diabetes mellitus with hyperglycemia: Secondary | ICD-10-CM | POA: Diagnosis present

## 2019-12-13 DIAGNOSIS — Z7952 Long term (current) use of systemic steroids: Secondary | ICD-10-CM

## 2019-12-13 LAB — CBC WITH DIFFERENTIAL/PLATELET
Abs Immature Granulocytes: 0.15 10*3/uL — ABNORMAL HIGH (ref 0.00–0.07)
Basophils Absolute: 0 10*3/uL (ref 0.0–0.1)
Basophils Relative: 0 %
Eosinophils Absolute: 0.2 10*3/uL (ref 0.0–0.5)
Eosinophils Relative: 1 %
HCT: 42.8 % (ref 36.0–46.0)
Hemoglobin: 13.4 g/dL (ref 12.0–15.0)
Immature Granulocytes: 1 %
Lymphocytes Relative: 5 %
Lymphs Abs: 0.9 10*3/uL (ref 0.7–4.0)
MCH: 28.6 pg (ref 26.0–34.0)
MCHC: 31.3 g/dL (ref 30.0–36.0)
MCV: 91.3 fL (ref 80.0–100.0)
Monocytes Absolute: 0.8 10*3/uL (ref 0.1–1.0)
Monocytes Relative: 4 %
Neutro Abs: 15.7 10*3/uL — ABNORMAL HIGH (ref 1.7–7.7)
Neutrophils Relative %: 89 %
Platelets: 211 10*3/uL (ref 150–400)
RBC: 4.69 MIL/uL (ref 3.87–5.11)
RDW: 13.7 % (ref 11.5–15.5)
WBC: 17.8 10*3/uL — ABNORMAL HIGH (ref 4.0–10.5)
nRBC: 0 % (ref 0.0–0.2)

## 2019-12-13 MED ORDER — SODIUM CHLORIDE 0.9 % IV SOLN
1.0000 g | Freq: Once | INTRAVENOUS | Status: AC
  Administered 2019-12-13: 1 g via INTRAVENOUS
  Filled 2019-12-13: qty 10

## 2019-12-13 MED ORDER — DEXAMETHASONE SODIUM PHOSPHATE 10 MG/ML IJ SOLN
10.0000 mg | Freq: Once | INTRAMUSCULAR | Status: AC
  Administered 2019-12-13: 10 mg via INTRAVENOUS
  Filled 2019-12-13: qty 1

## 2019-12-13 MED ORDER — SODIUM CHLORIDE 0.9 % IV SOLN
500.0000 mg | Freq: Once | INTRAVENOUS | Status: AC
  Administered 2019-12-14: 01:00:00 500 mg via INTRAVENOUS
  Filled 2019-12-13: qty 500

## 2019-12-13 NOTE — ED Triage Notes (Signed)
PT to ED via EMS from home. Dx covid around dec. 26. Has had increased cough, weakness and SOB in the last 24hrs. HX of COPD on 2L. EMS reports mid 80's% on 2L. PT arrived on NRB at 96%. PT AO.

## 2019-12-13 NOTE — ED Provider Notes (Signed)
[facility] Emergency Department Provider Note  ____________________________________________   First MD Initiated Contact with Patient 12/21/2019 2251     (approximate)  I have reviewed the triage vital signs and the nursing notes.   HISTORY  Chief Complaint Cough and Shortness of Breath   HPI Marissa Luna is a 64 y.o. female with below list of previous medical conditions including COVID-19 diagnosis on November 30, 2019 and COPD presents to the emergency department via EMS secondary to respiratory distress.  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  Patient was placed on a nonrebreather current oxygen saturation on arrival 92%.  Patient admits to increasing dyspnea generalized weakness and nonproductive cough over the past 24 hours.  Of note patient is on baseline 2 L of oxygen preceding her Covid diagnosis.  Patient denies any chest pain       Past Medical History:  Diagnosis Date  . Asthma   . Environmental allergies   . Hypertension   . Sarcoidosis     Patient Active Problem List   Diagnosis Date Noted  . Acute on chronic respiratory failure with hypoxia ([place]) 12/14/2019  . Healthcare-associated pneumonia 12/14/2019  . History of COVID-19 12/14/2019  . Acute respiratory failure ([place]) 12/14/2019  . COVID-19 11/30/2019  . Unsatisfactory cervical Papanicolaou smear 10/28/2019  . Pain in both lower extremities 06/30/2019  . Restless leg syndrome 12/20/2018  . Oxygen dependent 10/17/2018  . Other fatigue 10/17/2018  . Abnormal weight gain 10/17/2018  . Sarcoidosis   . Need for prophylactic vaccination against Streptococcus pneumoniae (pneumococcus) 07/11/2018  . Routine cervical smear 03/25/2018  . Dysuria 03/25/2018  . Chronic respiratory failure with hypoxia ([place]) 03/13/2018  . Acute bronchitis with COPD ([place]) 02/22/2018  . Lymphocytosis 02/22/2018  . Abnormal kidney function 02/22/2018  .  Sepsis ([place]) 01/11/2018  . Community acquired pneumonia of left lung 01/11/2018  . Influenza A 01/11/2018  . 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 Hypoxia 01/08/2018  . Allergic rhinitis, unspecified 01/08/2018  . Essential hypertension 01/08/2018  . 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 SOB (shortness of breath) 01/08/2018    Past Surgical History:  Procedure Laterality Date  . CHOLECYSTECTOMY    . COLONOSCOPY N/A 10/26/2015   Procedure: COLONOSCOPY;  Surgeon: Hulen Luster, MD;  Location: [facility] ENDOSCOPY;  Service: Gastroenterology;  Laterality: N/A;    Prior to Admission medications   Medication Sig Start Date End Date Taking? Authorizing Provider  albuterol (PROVENTIL) (2.5 MG/3ML) 0.083% nebulizer solution Take 3 mLs (2.5 mg total) by nebulization every 6 (six) hours as needed. 06/12/18   Ronnell Freshwater, NP  albuterol (VENTOLIN HFA) 108 (90 Base) MCG/ACT inhaler INHALE 2 PUFFS 4 TIMES A DAY AS NEEDED FOR WHEEZING 10/08/19   Boscia, Heather E, NP  amLODipine (NORVASC) 10 MG tablet Take 1 tablet (10 mg total) by mouth daily. 10/08/19   Ronnell Freshwater, NP  ascorbic acid (VITAMIN C) 500 MG tablet Take 1 tablet (500 mg total) by mouth daily for 10 days. 12/05/19 12/15/19  Sidney Ace, MD  azithromycin (ZITHROMAX) 250 MG tablet Take as directed 12/10/19   Kendell Bane, NP  Calcium Carbonate-Vitamin D 600-400 MG-UNIT tablet Take 1 tablet by mouth daily.     [provider]  cetirizine (ZYRTEC) 10 MG tablet Take 10 mg by mouth.    [provider]  fluticasone (FLONASE) 50 MCG/ACT nasal spray Place 2 sprays into  the nose daily.     [provider]  folic acid (FOLVITE) 1 MG tablet Take 1 mg by mouth.    [provider]  folic acid (FOLVITE) 1 MG tablet Take 1 tablet (1 mg total) by mouth daily for 10 days. 12/05/19 12/15/19  Sidney Ace, MD  guaiFENesin-dextromethorphan (ROBITUSSIN DM) 100-10 MG/5ML syrup Take 10 mLs by mouth every 4 (four)  hours as needed for cough. 12/04/19   Sidney Ace, MD  OXYGEN Inhale 2 L into the lungs.    [provider]  predniSONE (DELTASONE) 5 MG tablet TAKE 1 TABLET BY MOUTH EVERY DAY WITH BREAKFAST 08/09/19   Scarboro, Audie Clear, NP  rOPINIRole (REQUIP) 0.25 MG tablet Take 1 tablet (0.25 mg total) by mouth at bedtime. 03/19/19   Boscia, Greer Ee, NP  TRELEGY ELLIPTA 100-62.5-25 MCG/INH AEPB Inhale 1 puff into the lungs daily. 12/13/18   Allyne Gee, MD  Vitamin D, Ergocalciferol, (DRISDOL) 1.25 MG (50000 UT) CAPS capsule Take 1 capsule (50,000 Units total) by mouth once a week. 10/10/19   Ronnell Freshwater, NP    Allergies Patient has no known allergies.  Family History  Problem Relation Age of Onset  . Lung cancer Mother   . Colon cancer Father   . Diabetes Sister   . Breast cancer Sister     Social History Social History   Tobacco Use  . Smoking status: Never Smoker  . Smokeless tobacco: Never Used  Substance Use Topics  . Alcohol use: No  . Drug use: No    Review of Systems Constitutional: No fever/chills Eyes: No visual changes. ENT: No sore throat. Cardiovascular: Denies chest pain. Respiratory: Positive for shortness of breath. Gastrointestinal: No abdominal pain.  No nausea, no vomiting.  No diarrhea.  No constipation. Genitourinary: Negative for dysuria. Musculoskeletal: Negative for neck pain.  Negative for back pain. Integumentary: Negative for rash. Neurological: Negative for headaches, focal weakness or numbness.   ____________________________________________   PHYSICAL EXAM:  VITAL SIGNS: ED Triage Vitals [12/09/2019 2252]  Enc Vitals Group     BP      Pulse      Resp      Temp      Temp src      SpO2      Weight 71.2 kg (157 lb)     Height 1.499 m (4' 11" )     Head Circumference      Peak Flow      Pain Score 0     Pain Loc      Pain Edu?      Excl. in [place]?     Constitutional: Alert and oriented.  Eyes: Conjunctivae are normal.    Mouth/Throat: Patient is wearing a mask. Neck: No stridor.  No meningeal signs.   Cardiovascular: Normal rate, regular rhythm. Good peripheral circulation. Grossly normal heart sounds. Respiratory: Tachypnea, positive accessory respiratory muscle use, diffuse rhonchi predominately on the left Gastrointestinal: Soft and nontender. No distention.  Musculoskeletal: No lower extremity tenderness nor edema. No gross deformities of extremities. Neurologic:  Normal speech and language. No gross focal neurologic deficits are appreciated.  Skin:  Skin is warm, dry and intact. Psychiatric: Mood and affect are normal. Speech and behavior are normal.  ____________________________________________   LABS (all labs ordered are listed, but only abnormal results are displayed)  Labs Reviewed  CBC WITH DIFFERENTIAL/PLATELET - Abnormal; Notable for the following components:      Result Value   WBC  17.8 (*)    Neutro Abs 15.7 (*)    Abs Immature Granulocytes 0.15 (*)    All other components within normal limits  COMPREHENSIVE METABOLIC PANEL - Abnormal; Notable for the following components:   Glucose, Bld 217 (*)    BUN 27 (*)    Calcium 8.5 (*)    Albumin 2.8 (*)    GFR calc non Af Amer 59 (*)    All other components within normal limits  FIBRIN DERIVATIVES D-DIMER ([facility] ONLY) - Abnormal; Notable for the following components:   Fibrin derivatives D-dimer ([facility]) 1,572.16 (*)    All other components within normal limits  LACTATE DEHYDROGENASE - Abnormal; Notable for the following components:   LDH 270 (*)    All other components within normal limits  FERRITIN - Abnormal; Notable for the following components:   Ferritin 645 (*)    All other components within normal limits  TRIGLYCERIDES - Abnormal; Notable for the following components:   Triglycerides 166 (*)    All other components within normal limits  FIBRINOGEN - Abnormal; Notable for the following components:   Fibrinogen >750 (*)    All  other components within normal limits  CULTURE, BLOOD (ROUTINE X 2)  CULTURE, BLOOD (ROUTINE X 2)  RESPIRATORY PANEL BY PCR  EXPECTORATED SPUTUM ASSESSMENT W REFEX TO RESP CULTURE  CULTURE, BLOOD (ROUTINE X 2)  CULTURE, BLOOD (ROUTINE X 2)  LACTIC ACID, PLASMA  PROCALCITONIN  C-REACTIVE PROTEIN  HIV ANTIBODY (ROUTINE TESTING W REFLEX)  CBC   ____________________________________________  EKG  ED ECG REPORT I, McN[place] N Shayonna Ocampo, the attending physician, personally viewed and interpreted this ECG.   Date: 12/14/2019  EKG Time: 10:57 PM  Rate: 106  Rhythm: Sinus tachycardia  Axis: Normal  Intervals: Normal  ST&T Change: None  ____________________________________________  RADIOLOGY I, [facility] Ernst Bowler, personally viewed and evaluated these images (plain radiographs) as part of my medical decision making, as well as reviewing the written report by the radiologist.  ED MD interpretation:    Official radiology report(s): CT Angio Chest PE W and/or Wo Contrast  Result Date: 12/14/2019 CLINICAL DATA:  Shortness of breath EXAM: CT ANGIOGRAPHY CHEST WITH CONTRAST TECHNIQUE: Multidetector CT imaging of the chest was performed using the standard protocol during bolus administration of intravenous contrast. Multiplanar CT image reconstructions and MIPs were obtained to evaluate the vascular anatomy. CONTRAST:  114m OMNIPAQUE IOHEXOL 350 MG/ML SOLN COMPARISON:  Apr 22, 2010 FINDINGS: Cardiovascular: There is a optimal opacification of the pulmonary arteries. There is no central,segmental, or subsegmental filling defects within the pulmonary arteries. There is mild cardiomegaly. No evidence of right ventricular heart strain. There is normal three-vessel brachiocephalic anatomy without proximal stenosis. The thoracic aorta is normal in appearance. Mediastinum/Nodes: Again noted are extensively calcified mediastinal and bilateral hilar lymph nodes. Thyroid gland, trachea, and esophagus demonstrate  no significant findings. Lungs/Pleura: There is extensive bilateral bronchial wall thickening and bronchiectasis with honeycombing most notable in the posterior lower lungs with subpleural bleb formation. There has been interval progression since the prior exam dating back to 20/11. There is new hazy/ground-glass opacity seen within the periphery of the bilateral lungs, left slightly greater than right. No pleural effusion is seen. Upper Abdomen: No acute abnormalities present in the visualized portions of the upper abdomen. Musculoskeletal: No chest wall abnormality. No acute or significant osseous findings. Review of the MIP images confirms the above findings. IMPRESSION: 1. No central, segmental, or subsegmental pulmonary embolism. 2. Extensive bilateral hilar and mediastinal  calcified adenopathy with extensive posterior lower lung bronchiectasis, honeycombing, and subpleural bleb formation. There has been interval progression in the lung changes since the prior exam, consistent with progression of the patient's known sarcoidosis. 3. Hazy/ground-glass opacities seen within both lungs, left greater than right, consistent with COVID pneumonia. 4. Mild cardiomegaly Electronically Signed   By: Prudencio Pair M.D.   On: 12/14/2019 01:18   DG Chest Port 1 View  Result Date: 01/05/2020 CLINICAL DATA:  64 year old female with shortness of breath. EXAM: PORTABLE CHEST 1 VIEW COMPARISON:  Chest radiograph dated 11/30/2019. FINDINGS: Background of fibrotic changes primarily involving the left mid to lower lung field. Slight increased density primarily over the left lung may be chronic but appears more prominent compared to the prior radiograph and developing infiltrate is not excluded. Clinical correlation is recommended. No pleural effusion or pneumothorax. The cardiac silhouette is within normal limits. Calcified hilar and mediastinal granuloma. No acute osseous pathology. IMPRESSION: 1. Slight increased density  primarily in the left mid to lower lung field. Developing infiltrate is not excluded. Clinical correlation is recommended. 2. Calcified hilar and mediastinal granuloma. Electronically Signed   By: Anner Crete M.D.   On: 12/31/2019 23:28    ____________________________________________   PROCEDURES    .Critical Care Performed by: Gregor Hams, MD Authorized by: Gregor Hams, MD   Critical care provider statement:    Critical care time (minutes):  30   Critical care time was exclusive of:  Separately billable procedures and treating other patients   Critical care was necessary to treat or prevent imminent or life-threatening deterioration of the following conditions:  Respiratory failure and sepsis   Critical care was time spent personally by me on the following activities:  Development of treatment plan with patient or surrogate, discussions with consultants, evaluation of patient's response to treatment, examination of patient, obtaining history from patient or surrogate, ordering and performing treatments and interventions, ordering and review of laboratory studies, ordering and review of radiographic studies, pulse oximetry, re-evaluation of patient's condition and review of old charts     ____________________________________________   INITIAL IMPRESSION / MDM / [place] / ED COURSE  As part of my medical decision making, I reviewed the following data within the electronic MEDICAL RECORD NUMBER   64 year old female presented with above-stated history and physical exam secondary to respiratory distress.  Patient placed on high flow oxygen.  Differential diagnosis include but not limited to progression of COVID-19 infection, pneumonia (superimposed bacterial etiology), pulmonary emboli.  Patient met sepsis criteria in such sepsis protocol was initiated.  Patient received IV antibiotic therapy with ceftriaxone and azithromycin.  Patient discussed with Dr. Damita Dunnings for  hospital admission for further evaluation and management.     ____________________________________________  FINAL CLINICAL IMPRESSION(S) / ED DIAGNOSES  Final diagnoses:  Acute respiratory failure with hypoxia (HCC)  Multifocal pneumonia     MEDICATIONS GIVEN DURING THIS VISIT:  Medications  enoxaparin (LOVENOX) injection 40 mg (has no administration in time range)  0.45 % sodium chloride infusion (has no administration in time range)  cefTRIAXone (ROCEPHIN) 2 g in sodium chloride 0.9 % 100 mL IVPB (has no administration in time range)  azithromycin (ZITHROMAX) 500 mg in sodium chloride 0.9 % 250 mL IVPB (has no administration in time range)  methylPREDNISolone sodium succinate (SOLU-MEDROL) 125 mg/2 mL injection 60 mg (has no administration in time range)    Followed by  predniSONE (DELTASONE) tablet 40 mg (has no administration in time range)  ipratropium-albuterol (DUONEB) 0.5-2.5 (3) MG/3ML nebulizer solution 3 mL (has no administration in time range)  albuterol (PROVENTIL) (2.5 MG/3ML) 0.083% nebulizer solution 2.5 mg (has no administration in time range)  dexamethasone (DECADRON) injection 10 mg (10 mg Intravenous Given 12/10/2019 2326)  cefTRIAXone (ROCEPHIN) 1 g in sodium chloride 0.9 % 100 mL IVPB (0 g Intravenous Stopped 12/14/19 0108)  azithromycin (ZITHROMAX) 500 mg in sodium chloride 0.9 % 250 mL IVPB (500 mg Intravenous New Bag/Given 12/14/19 0110)  iohexol (OMNIPAQUE) 350 MG/ML injection 100 mL (100 mLs Intravenous Contrast Given 12/14/19 0053)     ED Discharge Orders    None      *Please note:  Marissa Luna was evaluated in Emergency Department on 12/14/2019 for the symptoms described in the history of present illness. She was evaluated in the context of the global COVID-19 pandemic, which necessitated consideration that the patient might be at risk for infection with the SARS-CoV-2 virus that causes COVID-19. Institutional protocols and algorithms that pertain to the  evaluation of patients at risk for COVID-19 are in a state of rapid change based on information released by regulatory bodies including the CDC and federal and state organizations. These policies and algorithms were followed during the patient's care in the ED.  Some ED evaluations and interventions may be delayed as a result of limited staffing during the pandemic.*  Note:  This document was prepared using Dragon voice recognition software and may include unintentional dictation errors.   Gregor Hams, MD 12/14/19 [id number]

## 2019-12-14 ENCOUNTER — Encounter: Payer: Self-pay | Admitting: Radiology

## 2019-12-14 ENCOUNTER — Emergency Department: Payer: 59

## 2019-12-14 DIAGNOSIS — Z7952 Long term (current) use of systemic steroids: Secondary | ICD-10-CM | POA: Diagnosis not present

## 2019-12-14 DIAGNOSIS — Z8616 Personal history of COVID-19: Secondary | ICD-10-CM

## 2019-12-14 DIAGNOSIS — E1165 Type 2 diabetes mellitus with hyperglycemia: Secondary | ICD-10-CM | POA: Diagnosis present

## 2019-12-14 DIAGNOSIS — F419 Anxiety disorder, unspecified: Secondary | ICD-10-CM | POA: Diagnosis present

## 2019-12-14 DIAGNOSIS — Y95 Nosocomial condition: Secondary | ICD-10-CM | POA: Diagnosis present

## 2019-12-14 DIAGNOSIS — G2581 Restless legs syndrome: Secondary | ICD-10-CM | POA: Diagnosis present

## 2019-12-14 DIAGNOSIS — Z66 Do not resuscitate: Secondary | ICD-10-CM | POA: Diagnosis not present

## 2019-12-14 DIAGNOSIS — J189 Pneumonia, unspecified organism: Secondary | ICD-10-CM

## 2019-12-14 DIAGNOSIS — Z803 Family history of malignant neoplasm of breast: Secondary | ICD-10-CM | POA: Diagnosis not present

## 2019-12-14 DIAGNOSIS — J9600 Acute respiratory failure, unspecified whether with hypoxia or hypercapnia: Secondary | ICD-10-CM | POA: Diagnosis present

## 2019-12-14 DIAGNOSIS — D860 Sarcoidosis of lung: Secondary | ICD-10-CM | POA: Diagnosis present

## 2019-12-14 DIAGNOSIS — R7309 Other abnormal glucose: Secondary | ICD-10-CM | POA: Insufficient documentation

## 2019-12-14 DIAGNOSIS — J9621 Acute and chronic respiratory failure with hypoxia: Secondary | ICD-10-CM | POA: Diagnosis present

## 2019-12-14 DIAGNOSIS — J181 Lobar pneumonia, unspecified organism: Secondary | ICD-10-CM | POA: Diagnosis present

## 2019-12-14 DIAGNOSIS — R350 Frequency of micturition: Secondary | ICD-10-CM | POA: Insufficient documentation

## 2019-12-14 DIAGNOSIS — Z800 Family history of malignant neoplasm of digestive organs: Secondary | ICD-10-CM | POA: Diagnosis not present

## 2019-12-14 DIAGNOSIS — R0602 Shortness of breath: Secondary | ICD-10-CM | POA: Diagnosis not present

## 2019-12-14 DIAGNOSIS — J470 Bronchiectasis with acute lower respiratory infection: Secondary | ICD-10-CM | POA: Diagnosis present

## 2019-12-14 DIAGNOSIS — B948 Sequelae of other specified infectious and parasitic diseases: Secondary | ICD-10-CM | POA: Diagnosis not present

## 2019-12-14 DIAGNOSIS — I10 Essential (primary) hypertension: Secondary | ICD-10-CM

## 2019-12-14 DIAGNOSIS — Z7189 Other specified counseling: Secondary | ICD-10-CM | POA: Diagnosis not present

## 2019-12-14 DIAGNOSIS — Z79899 Other long term (current) drug therapy: Secondary | ICD-10-CM | POA: Diagnosis not present

## 2019-12-14 DIAGNOSIS — T380X5A Adverse effect of glucocorticoids and synthetic analogues, initial encounter: Secondary | ICD-10-CM | POA: Diagnosis present

## 2019-12-14 DIAGNOSIS — M5430 Sciatica, unspecified side: Secondary | ICD-10-CM | POA: Diagnosis present

## 2019-12-14 DIAGNOSIS — R531 Weakness: Secondary | ICD-10-CM

## 2019-12-14 DIAGNOSIS — J8410 Pulmonary fibrosis, unspecified: Secondary | ICD-10-CM | POA: Diagnosis present

## 2019-12-14 DIAGNOSIS — Z515 Encounter for palliative care: Secondary | ICD-10-CM | POA: Diagnosis not present

## 2019-12-14 DIAGNOSIS — J8489 Other specified interstitial pulmonary diseases: Secondary | ICD-10-CM | POA: Diagnosis present

## 2019-12-14 DIAGNOSIS — Z833 Family history of diabetes mellitus: Secondary | ICD-10-CM | POA: Diagnosis not present

## 2019-12-14 DIAGNOSIS — Z9981 Dependence on supplemental oxygen: Secondary | ICD-10-CM | POA: Diagnosis not present

## 2019-12-14 DIAGNOSIS — E119 Type 2 diabetes mellitus without complications: Secondary | ICD-10-CM | POA: Diagnosis not present

## 2019-12-14 LAB — COMPREHENSIVE METABOLIC PANEL
ALT: 30 U/L (ref 0–44)
AST: 31 U/L (ref 15–41)
Albumin: 2.8 g/dL — ABNORMAL LOW (ref 3.5–5.0)
Alkaline Phosphatase: 83 U/L (ref 38–126)
Anion gap: 10 (ref 5–15)
BUN: 27 mg/dL — ABNORMAL HIGH (ref 8–23)
CO2: 25 mmol/L (ref 22–32)
Calcium: 8.5 mg/dL — ABNORMAL LOW (ref 8.9–10.3)
Chloride: 107 mmol/L (ref 98–111)
Creatinine, Ser: 1 mg/dL (ref 0.44–1.00)
GFR calc Af Amer: >60 mL/min (ref >60)
GFR calc non Af Amer: 59 mL/min — ABNORMAL LOW (ref >60)
Glucose, Bld: 217 mg/dL — ABNORMAL HIGH (ref 70–99)
Potassium: 4 mmol/L (ref 3.5–5.1)
Sodium: 142 mmol/L (ref 135–145)
Total Bilirubin: 0.7 mg/dL (ref 0.3–1.2)
Total Protein: 7.6 g/dL (ref 6.5–8.1)

## 2019-12-14 LAB — RESPIRATORY PANEL BY PCR

## 2019-12-14 LAB — HEMOGLOBIN A1C
Hgb A1c MFr Bld: 8 % — ABNORMAL HIGH (ref 4.8–5.6)
Mean Plasma Glucose: 182.9 mg/dL

## 2019-12-14 LAB — LACTIC ACID, PLASMA: Lactic Acid, Venous: 1.4 mmol/L (ref 0.5–1.9)

## 2019-12-14 LAB — PROCALCITONIN: Procalcitonin: 0.25 ng/mL

## 2019-12-14 LAB — C-REACTIVE PROTEIN: CRP: 41.6 mg/dL — ABNORMAL HIGH (ref <1.0)

## 2019-12-14 LAB — GLUCOSE, CAPILLARY
Glucose-Capillary: 522 mg/dL (ref 70–99)
Glucose-Capillary: 420 mg/dL — ABNORMAL HIGH (ref 70–99)
Glucose-Capillary: 404 mg/dL — ABNORMAL HIGH (ref 70–99)
Glucose-Capillary: 291 mg/dL — ABNORMAL HIGH (ref 70–99)

## 2019-12-14 LAB — FIBRINOGEN: Fibrinogen: >750 mg/dL — ABNORMAL HIGH (ref 210–475)

## 2019-12-14 LAB — FIBRIN DERIVATIVES D-DIMER (ARMC ONLY): Fibrin derivatives D-dimer (ARMC): 1572.16 ng/mL (FEU) — ABNORMAL HIGH (ref 0.00–499.00)

## 2019-12-14 LAB — FERRITIN: Ferritin: 645 ng/mL — ABNORMAL HIGH (ref 11–307)

## 2019-12-14 LAB — LACTATE DEHYDROGENASE: LDH: 270 U/L — ABNORMAL HIGH (ref 98–192)

## 2019-12-14 LAB — HIV ANTIBODY (ROUTINE TESTING W REFLEX): HIV Screen 4th Generation wRfx: NONREACTIVE

## 2019-12-14 LAB — TRIGLYCERIDES: Triglycerides: 166 mg/dL — ABNORMAL HIGH (ref <150)

## 2019-12-14 MED ORDER — ALBUTEROL SULFATE (2.5 MG/3ML) 0.083% IN NEBU: 2.5000 mg | INHALATION_SOLUTION | RESPIRATORY_TRACT | Status: DC | PRN

## 2019-12-14 MED ORDER — ASCORBIC ACID 500 MG PO TABS
500.0000 mg | ORAL_TABLET | Freq: Every day | ORAL | Status: DC
  Administered 2019-12-14 – 2020-01-05 (×22): 500 mg via ORAL
  Filled 2019-12-14 (×23): qty 1

## 2019-12-14 MED ORDER — INSULIN ASPART 100 UNIT/ML ~~LOC~~ SOLN
0.0000 [IU] | Freq: Three times a day (TID) | SUBCUTANEOUS | Status: DC
  Administered 2019-12-15: 2 [IU] via SUBCUTANEOUS
  Administered 2019-12-15: 5 [IU] via SUBCUTANEOUS
  Administered 2019-12-15: 9 [IU] via SUBCUTANEOUS
  Administered 2019-12-16: 2 [IU] via SUBCUTANEOUS
  Administered 2019-12-16: 9 [IU] via SUBCUTANEOUS
  Administered 2019-12-16: 5 [IU] via SUBCUTANEOUS
  Administered 2019-12-18: 9 [IU] via SUBCUTANEOUS
  Administered 2019-12-18: 5 [IU] via SUBCUTANEOUS
  Administered 2019-12-19: 2 [IU] via SUBCUTANEOUS
  Administered 2019-12-19: 3 [IU] via SUBCUTANEOUS
  Administered 2019-12-19: 7 [IU] via SUBCUTANEOUS
  Administered 2019-12-20: 1 [IU] via SUBCUTANEOUS
  Administered 2019-12-20: 3 [IU] via SUBCUTANEOUS
  Administered 2019-12-21 (×2): 5 [IU] via SUBCUTANEOUS
  Administered 2019-12-21: 3 [IU] via SUBCUTANEOUS
  Administered 2019-12-22: 7 [IU] via SUBCUTANEOUS
  Administered 2019-12-22: 2 [IU] via SUBCUTANEOUS
  Administered 2019-12-22: 3 [IU] via SUBCUTANEOUS
  Administered 2019-12-23: 7 [IU] via SUBCUTANEOUS
  Administered 2019-12-23: 1 [IU] via SUBCUTANEOUS
  Administered 2019-12-23: 2 [IU] via SUBCUTANEOUS
  Administered 2019-12-24: 9 [IU] via SUBCUTANEOUS
  Administered 2019-12-24: 1 [IU] via SUBCUTANEOUS
  Administered 2019-12-25 (×2): 3 [IU] via SUBCUTANEOUS
  Administered 2019-12-25: 2 [IU] via SUBCUTANEOUS
  Administered 2019-12-26 – 2019-12-27 (×3): 5 [IU] via SUBCUTANEOUS
  Administered 2019-12-28: 2 [IU] via SUBCUTANEOUS
  Administered 2019-12-29: 1 [IU] via SUBCUTANEOUS
  Administered 2019-12-29 (×2): 9 [IU] via SUBCUTANEOUS
  Administered 2019-12-30: 5 [IU] via SUBCUTANEOUS
  Administered 2019-12-30 (×2): 2 [IU] via SUBCUTANEOUS
  Administered 2019-12-31: 7 [IU] via SUBCUTANEOUS
  Administered 2019-12-31: 3 [IU] via SUBCUTANEOUS
  Administered 2020-01-01: 2 [IU] via SUBCUTANEOUS
  Administered 2020-01-01: 5 [IU] via SUBCUTANEOUS
  Administered 2020-01-02: 7 [IU] via SUBCUTANEOUS
  Administered 2020-01-02: 5 [IU] via SUBCUTANEOUS
  Administered 2020-01-03: 2 [IU] via SUBCUTANEOUS
  Administered 2020-01-03: 5 [IU] via SUBCUTANEOUS
  Administered 2020-01-03: 3 [IU] via SUBCUTANEOUS
  Administered 2020-01-04 (×2): 2 [IU] via SUBCUTANEOUS
  Administered 2020-01-04 – 2020-01-05 (×2): 9 [IU] via SUBCUTANEOUS
  Administered 2020-01-05: 10:00:00 5 [IU] via SUBCUTANEOUS
  Filled 2019-12-14 (×52): qty 1

## 2019-12-14 MED ORDER — UMECLIDINIUM BROMIDE 62.5 MCG/INH IN AEPB
1.0000 | INHALATION_SPRAY | Freq: Every day | RESPIRATORY_TRACT | Status: DC
  Administered 2019-12-14 – 2019-12-24 (×11): 1 via RESPIRATORY_TRACT
  Filled 2019-12-14 (×2): qty 7

## 2019-12-14 MED ORDER — FLUTICASONE-UMECLIDIN-VILANT 100-62.5-25 MCG/INH IN AEPB: 1.0000 | INHALATION_SPRAY | Freq: Every day | RESPIRATORY_TRACT | Status: DC

## 2019-12-14 MED ORDER — GUAIFENESIN-DM 100-10 MG/5ML PO SYRP
10.0000 mL | ORAL_SOLUTION | ORAL | Status: DC | PRN
  Administered 2019-12-17 – 2020-01-03 (×17): 10 mL via ORAL
  Filled 2019-12-14 (×17): qty 10

## 2019-12-14 MED ORDER — METHYLPREDNISOLONE SODIUM SUCC 40 MG IJ SOLR: 40.0000 mg | Freq: Two times a day (BID) | INTRAMUSCULAR | Status: DC

## 2019-12-14 MED ORDER — FLUTICASONE PROPIONATE 50 MCG/ACT NA SUSP
2.0000 | Freq: Every day | NASAL | Status: DC
  Administered 2019-12-15 – 2020-01-05 (×21): 2 via NASAL
  Filled 2019-12-14 (×2): qty 16

## 2019-12-14 MED ORDER — VANCOMYCIN HCL 1500 MG/300ML IV SOLN
1500.0000 mg | Freq: Once | INTRAVENOUS | Status: AC
  Administered 2019-12-14: 1500 mg via INTRAVENOUS
  Filled 2019-12-14: qty 300

## 2019-12-14 MED ORDER — SODIUM CHLORIDE 0.45 % IV SOLN: INTRAVENOUS | Status: DC

## 2019-12-14 MED ORDER — SODIUM CHLORIDE 0.9 % IV SOLN
2.0000 g | INTRAVENOUS | Status: AC
  Administered 2019-12-14 – 2019-12-18 (×5): 2 g via INTRAVENOUS
  Filled 2019-12-14 (×2): qty 20
  Filled 2019-12-14 (×3): qty 2

## 2019-12-14 MED ORDER — METHYLPREDNISOLONE SODIUM SUCC 125 MG IJ SOLR
60.0000 mg | Freq: Two times a day (BID) | INTRAMUSCULAR | Status: DC
  Administered 2019-12-14: 60 mg via INTRAVENOUS
  Filled 2019-12-14: qty 2

## 2019-12-14 MED ORDER — FUROSEMIDE 20 MG PO TABS
20.0000 mg | ORAL_TABLET | Freq: Every day | ORAL | Status: DC
  Administered 2019-12-14 – 2020-01-03 (×21): 20 mg via ORAL
  Filled 2019-12-14 (×24): qty 1

## 2019-12-14 MED ORDER — METHYLPREDNISOLONE SODIUM SUCC 40 MG IJ SOLR
40.0000 mg | Freq: Every day | INTRAMUSCULAR | Status: DC
  Administered 2019-12-15 – 2019-12-20 (×6): 40 mg via INTRAVENOUS
  Filled 2019-12-14 (×6): qty 1

## 2019-12-14 MED ORDER — FLUTICASONE FUROATE-VILANTEROL 100-25 MCG/INH IN AEPB
1.0000 | INHALATION_SPRAY | Freq: Every day | RESPIRATORY_TRACT | Status: DC
  Administered 2019-12-15 – 2020-01-05 (×22): 1 via RESPIRATORY_TRACT
  Filled 2019-12-14 (×3): qty 28

## 2019-12-14 MED ORDER — CALCIUM CARBONATE-VITAMIN D 500-200 MG-UNIT PO TABS
1.0000 | ORAL_TABLET | Freq: Every day | ORAL | Status: DC
  Administered 2019-12-15 – 2020-01-05 (×21): 1 via ORAL
  Filled 2019-12-14 (×23): qty 1

## 2019-12-14 MED ORDER — VANCOMYCIN HCL IN DEXTROSE 1-5 GM/200ML-% IV SOLN
1000.0000 mg | INTRAVENOUS | Status: DC
  Filled 2019-12-14: qty 200

## 2019-12-14 MED ORDER — IPRATROPIUM-ALBUTEROL 0.5-2.5 (3) MG/3ML IN SOLN
3.0000 mL | Freq: Four times a day (QID) | RESPIRATORY_TRACT | Status: DC
  Administered 2019-12-14: 3 mL via RESPIRATORY_TRACT
  Filled 2019-12-14 (×2): qty 3

## 2019-12-14 MED ORDER — SODIUM CHLORIDE 0.9 % IV SOLN
500.0000 mg | INTRAVENOUS | Status: DC
  Administered 2019-12-14 – 2019-12-16 (×3): 500 mg via INTRAVENOUS
  Filled 2019-12-14 (×4): qty 500

## 2019-12-14 MED ORDER — ROPINIROLE HCL 0.25 MG PO TABS
0.2500 mg | ORAL_TABLET | Freq: Every day | ORAL | Status: DC
  Administered 2019-12-14 – 2020-01-04 (×22): 0.25 mg via ORAL
  Filled 2019-12-14 (×24): qty 1

## 2019-12-14 MED ORDER — INSULIN ASPART 100 UNIT/ML ~~LOC~~ SOLN
0.0000 [IU] | Freq: Every day | SUBCUTANEOUS | Status: DC
  Administered 2019-12-14: 3 [IU] via SUBCUTANEOUS
  Administered 2019-12-17 – 2019-12-22 (×2): 2 [IU] via SUBCUTANEOUS
  Administered 2019-12-27: 4 [IU] via SUBCUTANEOUS
  Administered 2019-12-30: 2 [IU] via SUBCUTANEOUS
  Administered 2019-12-31: 5 [IU] via SUBCUTANEOUS
  Administered 2020-01-01: 2 [IU] via SUBCUTANEOUS
  Filled 2019-12-14 (×9): qty 1

## 2019-12-14 MED ORDER — ENOXAPARIN SODIUM 40 MG/0.4ML ~~LOC~~ SOLN
40.0000 mg | SUBCUTANEOUS | Status: DC
  Administered 2019-12-14 – 2020-01-05 (×22): 40 mg via SUBCUTANEOUS
  Filled 2019-12-14 (×22): qty 0.4

## 2019-12-14 MED ORDER — IPRATROPIUM-ALBUTEROL 20-100 MCG/ACT IN AERS
1.0000 | INHALATION_SPRAY | Freq: Four times a day (QID) | RESPIRATORY_TRACT | Status: DC
  Administered 2019-12-14 – 2020-01-05 (×84): 1 via RESPIRATORY_TRACT
  Filled 2019-12-14: qty 4

## 2019-12-14 MED ORDER — PREDNISONE 20 MG PO TABS: 40.0000 mg | ORAL_TABLET | Freq: Every day | ORAL | Status: DC

## 2019-12-14 MED ORDER — INSULIN GLARGINE 100 UNIT/ML ~~LOC~~ SOLN
10.0000 [IU] | Freq: Every day | SUBCUTANEOUS | Status: DC
  Administered 2019-12-15 – 2019-12-16 (×3): 10 [IU] via SUBCUTANEOUS
  Filled 2019-12-14 (×4): qty 0.1

## 2019-12-14 MED ORDER — AMLODIPINE BESYLATE 10 MG PO TABS
10.0000 mg | ORAL_TABLET | Freq: Every day | ORAL | Status: DC
  Administered 2019-12-14 – 2019-12-31 (×17): 10 mg via ORAL
  Filled 2019-12-14 (×13): qty 1
  Filled 2019-12-14: qty 2
  Filled 2019-12-14 (×6): qty 1

## 2019-12-14 MED ORDER — FOLIC ACID 1 MG PO TABS
1.0000 mg | ORAL_TABLET | Freq: Every day | ORAL | Status: DC
  Administered 2019-12-14 – 2020-01-05 (×22): 1 mg via ORAL
  Filled 2019-12-14 (×23): qty 1

## 2019-12-14 MED ORDER — LORATADINE 10 MG PO TABS
10.0000 mg | ORAL_TABLET | Freq: Every day | ORAL | Status: DC
  Administered 2019-12-14 – 2020-01-05 (×23): 10 mg via ORAL
  Filled 2019-12-14 (×23): qty 1

## 2019-12-14 MED ORDER — AMLODIPINE BESYLATE 5 MG PO TABS: 10.0000 mg | ORAL_TABLET | Freq: Every day | ORAL | Status: DC

## 2019-12-14 MED ORDER — IOHEXOL 350 MG/ML SOLN
100.0000 mL | Freq: Once | INTRAVENOUS | Status: AC | PRN
  Administered 2019-12-14: 100 mL via INTRAVENOUS

## 2019-12-14 MED ORDER — INSULIN ASPART 100 UNIT/ML ~~LOC~~ SOLN
28.0000 [IU] | Freq: Once | SUBCUTANEOUS | Status: AC
  Administered 2019-12-14: 28 [IU] via SUBCUTANEOUS
  Filled 2019-12-14: qty 1

## 2019-12-14 NOTE — H&P (Signed)
History and Physical    Marissa Luna:[id number] DOB: Dec 13, 1955 DOA: 12/16/2019  PCP: Carlean Jews, NP   Patient coming from: home  I have personally briefly reviewed patient's old medical records in [facility]  Chief Complaint: cough and shortness of breath  HPI: Marissa Luna is a 64 y.o. female with medical history significant for sarcoidosis and COPD with chronic respiratory failure on home O2 at 2 L, history of hypertension and recently hospitalized from 11/30/19 - 12/04/19 with pneumonia and respiratory failure secondary to COVID-19 pneumonia, treated with 5 days of remdesivir and Decadron, who returns to the emergency room with a complaint of increased cough, weakness and shortness of breath.  She denies chest pain, nausea vomiting or diarrhea, palpitations  ED Course: On arrival in the emergency room she was afebrile with temperature 96, blood pressure 119/74 heart rate 106 respirations 21 and O2 sat reportedly in the 80s on 2 L improving to 96% on nonrebreather, then transitioned to high flow nasal cannula at 7 L.  On her blood work she had a high white cell count of 17,000, normal hemoglobin.  She had elevated inflammatory markers with a D-dimer of 1572, ferritin 645.  Procalcitonin was 0.25.  She had a CT of the chest that showed hazy groundglass opacities seen within both lungs consistent with COVID-19 pneumonia and also showed interval progression of his sarcoidosis but compared to the 2011.  Patient was started on IV antibiotics, dexamethasone and hospitalist consulted for admission.  Review of Systems: As per HPI otherwise 10 point review of systems negative.    Past Medical History:  Diagnosis Date  . Asthma   . Environmental allergies   . Hypertension   . Sarcoidosis     Past Surgical History:  Procedure Laterality Date  . CHOLECYSTECTOMY    . COLONOSCOPY N/A 10/26/2015   Procedure: COLONOSCOPY;  Surgeon: Wallace Cullens, MD;  [facility];   Service: Gastroenterology;  Laterality: N/A;     reports that she has never smoked. She has never used smokeless tobacco. She reports that she does not drink alcohol or use drugs.  No Known Allergies  Family History  Problem Relation Age of Onset  . Lung cancer Mother   . Colon cancer Father   . Diabetes Sister   . Breast cancer Sister      Prior to Admission medications   Medication Sig Start Date End Date Taking? Authorizing Provider  albuterol (PROVENTIL) (2.5 MG/3ML) 0.083% nebulizer solution Take 3 mLs (2.5 mg total) by nebulization every 6 (six) hours as needed. 06/12/18   Carlean Jews, NP  albuterol (VENTOLIN HFA) 108 (90 Base) MCG/ACT inhaler INHALE 2 PUFFS 4 TIMES A DAY AS NEEDED FOR WHEEZING 10/08/19   Boscia, Heather E, NP  amLODipine (NORVASC) 10 MG tablet Take 1 tablet (10 mg total) by mouth daily. 10/08/19   Carlean Jews, NP  ascorbic acid (VITAMIN C) 500 MG tablet Take 1 tablet (500 mg total) by mouth daily for 10 days. 12/05/19 12/15/19  Tresa Dingwall, MD  azithromycin (ZITHROMAX) 250 MG tablet Take as directed 12/10/19   Johnna Acosta, NP  Calcium Carbonate-Vitamin D 600-400 MG-UNIT tablet Take 1 tablet by mouth daily.     [provider]  cetirizine (ZYRTEC) 10 MG tablet Take 10 mg by mouth.    [provider]  fluticasone (FLONASE) 50 MCG/ACT nasal spray Place 2 sprays into the nose daily.     [provider]  folic acid (FOLVITE) 1 MG tablet Take 1 mg by mouth.    [provider]  folic acid (FOLVITE) 1 MG tablet Take 1 tablet (1 mg total) by mouth daily for 10 days. 12/05/19 12/15/19  Sidney Ace, MD  guaiFENesin-dextromethorphan (ROBITUSSIN DM) 100-10 MG/5ML syrup Take 10 mLs by mouth every 4 (four) hours as needed for cough. 12/04/19   Sidney Ace, MD  OXYGEN Inhale 2 L into the lungs.    [provider]  predniSONE (DELTASONE) 5 MG tablet TAKE 1 TABLET BY MOUTH EVERY DAY WITH BREAKFAST 08/09/19    Scarboro, Audie Clear, NP  rOPINIRole (REQUIP) 0.25 MG tablet Take 1 tablet (0.25 mg total) by mouth at bedtime. 03/19/19   Boscia, Greer Ee, NP  TRELEGY ELLIPTA 100-62.5-25 MCG/INH AEPB Inhale 1 puff into the lungs daily. 12/13/18   Allyne Gee, MD  Vitamin D, Ergocalciferol, (DRISDOL) 1.25 MG (50000 UT) CAPS capsule Take 1 capsule (50,000 Units total) by mouth once a week. 10/10/19   Ronnell Freshwater, NP    Physical Exam: Vitals:   Jan 08, 2020 2300 12/14/19 0105 12/14/19 0110 12/14/19 0130  BP: 109/80 122/74  133/75  Pulse: (!) 108 (!) 104 (!) 102 98  Resp: (!) 30  16 20   Temp:      TempSrc:      SpO2: 98% 100% 100% 100%  Weight:      Height:         Vitals:   Jan 08, 2020 2300 12/14/19 0105 12/14/19 0110 12/14/19 0130  BP: 109/80 122/74  133/75  Pulse: (!) 108 (!) 104 (!) 102 98  Resp: (!) 30  16 20   Temp:      TempSrc:      SpO2: 98% 100% 100% 100%  Weight:      Height:        Constitutional: NAD, alert and oriented x 3 Eyes: PERRL, lids and conjunctivae normal ENMT: Mucous membranes are moist.  Neck: normal, supple, no masses, no thyromegaly Respiratory: Increased respiratory effort.  Tachypnea and conversational dyspnea , wheezes, few crackles . cardiovascular: Regular rate and rhythm, no murmurs / rubs / gallops. No extremity edema. 2+ pedal pulses. No carotid bruits.  Abdomen: no tenderness, no masses palpated. No hepatosplenomegaly. Bowel sounds positive.  Musculoskeletal: no clubbing / cyanosis. No joint deformity upper and lower extremities.  Skin: no rashes, lesions, ulcers.  Neurologic: No gross focal neurologic deficit. Psychiatric: Normal mood and affect.   Labs on Admission: I have personally reviewed following labs and imaging studies  CBC: Recent Labs  Lab 01-08-2020 2332  WBC 17.8*  NEUTROABS 15.7*  HGB 13.4  HCT 42.8  MCV 91.3  PLT 619   Basic Metabolic Panel: Recent Labs  Lab 2020-01-08 2332  NA 142  K 4.0  CL 107  CO2 25  GLUCOSE 217*  BUN  27*  CREATININE 1.00  CALCIUM 8.5*   GFR: Estimated Creatinine Clearance: 48.8 mL/min (by C-G formula based on SCr of 1 mg/dL). Liver Function Tests: Recent Labs  Lab 2020/01/08 2332  AST 31  ALT 30  ALKPHOS 83  BILITOT 0.7  PROT 7.6  ALBUMIN 2.8*   No results for input(s): LIPASE, AMYLASE in the last 168 hours. No results for input(s): AMMONIA in the last 168 hours. Coagulation Profile: No results for input(s): INR, PROTIME in the last 168 hours. Cardiac Enzymes: No results for input(s): CKTOTAL, CKMB, CKMBINDEX, TROPONINI in the last 168 hours. BNP (last 3 results) No results for input(s): PROBNP  in the last 8760 hours. HbA1C: No results for input(s): HGBA1C in the last 72 hours. CBG: No results for input(s): GLUCAP in the last 168 hours. Lipid Profile: Recent Labs    12/29/2019 2332  TRIG 166*   Thyroid Function Tests: No results for input(s): TSH, T4TOTAL, FREET4, T3FREE, THYROIDAB in the last 72 hours. Anemia Panel: Recent Labs    2019/12/29 2332  FERRITIN 645*   Urine analysis:    Component Value Date/Time   COLORURINE YELLOW (A) 01/11/2018 2100   APPEARANCEUR Clear 06/25/2019 0920   LABSPEC 1.010 01/11/2018 2100   PHURINE 6.0 01/11/2018 2100   GLUCOSEU Negative 06/25/2019 0920   HGBUR SMALL (A) 01/11/2018 2100   BILIRUBINUR Negative 06/25/2019 0920   KETONESUR NEGATIVE 01/11/2018 2100   PROTEINUR Negative 06/25/2019 0920   PROTEINUR NEGATIVE 01/11/2018 2100   NITRITE Negative 06/25/2019 0920   NITRITE NEGATIVE 01/11/2018 2100   LEUKOCYTESUR Negative 06/25/2019 0920    Radiological Exams on Admission: CT Angio Chest PE W and/or Wo Contrast  Result Date: 12/14/2019 CLINICAL DATA:  Shortness of breath EXAM: CT ANGIOGRAPHY CHEST WITH CONTRAST TECHNIQUE: Multidetector CT imaging of the chest was performed using the standard protocol during bolus administration of intravenous contrast. Multiplanar CT image reconstructions and MIPs were obtained to evaluate  the vascular anatomy. CONTRAST:  OMNIPAQUE IOHEXOL 350 MG/ML SOLN COMPARISON:  Apr 22, 2010 FINDINGS: Cardiovascular: There is a optimal opacification of the pulmonary arteries. There is no central,segmental, or subsegmental filling defects within the pulmonary arteries. There is mild cardiomegaly. No evidence of right ventricular heart strain. There is normal three-vessel brachiocephalic anatomy without proximal stenosis. The thoracic aorta is normal in appearance. Mediastinum/Nodes: Again noted are extensively calcified mediastinal and bilateral hilar lymph nodes. Thyroid gland, trachea, and esophagus demonstrate no significant findings. Lungs/Pleura: There is extensive bilateral bronchial wall thickening and bronchiectasis with honeycombing most notable in the posterior lower lungs with subpleural bleb formation. There has been interval progression since the prior exam dating back to 20/11. There is new hazy/ground-glass opacity seen within the periphery of the bilateral lungs, left slightly greater than right. No pleural effusion is seen. Upper Abdomen: No acute abnormalities present in the visualized portions of the upper abdomen. Musculoskeletal: No chest wall abnormality. No acute or significant osseous findings. Review of the MIP images confirms the above findings. IMPRESSION: 1. No central, segmental, or subsegmental pulmonary embolism. 2. Extensive bilateral hilar and mediastinal calcified adenopathy with extensive posterior lower lung bronchiectasis, honeycombing, and subpleural bleb formation. There has been interval progression in the lung changes since the prior exam, consistent with progression of the patient's known sarcoidosis. 3. Hazy/ground-glass opacities seen within both lungs, left greater than right, consistent with COVID pneumonia. 4. Mild cardiomegaly Electronically Signed   By: Jonna Clark M.D.   On: 12/14/2019 01:18   DG Chest Port 1 View  Result Date: Dec 29, 2019 CLINICAL DATA:   64 year old female with shortness of breath. EXAM: PORTABLE CHEST 1 VIEW COMPARISON:  Chest radiograph dated 11/30/2019. FINDINGS: Background of fibrotic changes primarily involving the left mid to lower lung field. Slight increased density primarily over the left lung may be chronic but appears more prominent compared to the prior radiograph and developing infiltrate is not excluded. Clinical correlation is recommended. No pleural effusion or pneumothorax. The cardiac silhouette is within normal limits. Calcified hilar and mediastinal granuloma. No acute osseous pathology. IMPRESSION: 1. Slight increased density primarily in the left mid to lower lung field. Developing infiltrate is not excluded. Clinical correlation  is recommended. 2. Calcified hilar and mediastinal granuloma. Electronically Signed   By: Elgie Collard M.D.   On: 12/23/2019 23:28    EKG: Independently reviewed.   Assessment/Plan Principal Problem:   Acute on chronic respiratory failure with hypoxia (HCC) COVID-19 pneumonia Healthcare associated pneumonia -Suspect secondary to combination of COVID-19 pneumonia as well as possible bacterial pneumonia. Continue Rocephin and azithromycin and will add vancomycin IV methylprednisolone Scheduled and as needed short-acting beta agonists Oxygen supplementation to keep sats over percent Consider ID consult    Sarcoidosis of lung (HCC) CTA chest showing worsening from 2011 Management as above Pulmonology consult might be appropriate if patient not improving with above    Essential hypertension Blood pressure controlled Continue home amlodipine ending med reconciliation       DVT prophylaxis: lovenox  Code Status: full  Family Communication: none  Disposition Plan: Back to previous home environment Consults called: none     Andris Baumann MD [facility]     12/14/2019, 2:32 AM

## 2019-12-14 NOTE — Evaluation (Signed)
Physical Therapy Evaluation Patient Details Name: Marissa Luna MRN: [id number] DOB: 02-Jun-1956 Today's Date: 12/14/2019   History of Present Illness  Pt admitted for acute on chronic respiratory failure with hypoxia secondary to Covid. She complains of cough, weakness, and SOB symptoms. History includes sarcodosis, COPD, CRD on 2L of O2 chronic, HTN and recent hospital stay for covid 12/26- 12/30  Clinical Impression  Pt is a pleasant 64 year old female who was admitted for acute on chronic respiratory failure with hypoxia secondary to Covid. Pt performs bed mobility with supervision, however becomes very SOB while sitting at EOB. Vitals remained stable HR 107, BP 145/67, and O2 at 98% on 15L of O2. Pt demonstrates deficits with strength/endurance. Pt appears very motivated to improve in her functional independence. Encouraged to continue performing HEP in bed to maintain strength as well as breathing exercises and sitting up for meals. Will continue to address mobility as SOB symptoms improve. Would benefit from skilled PT to address above deficits and promote optimal return to PLOF. Recommend transition to HHPT upon discharge from acute hospitalization.     Follow Up Recommendations Home health PT;Supervision for mobility/OOB    Equipment Recommendations  Rolling walker with 5" wheels    Recommendations for Other Services       Precautions / Restrictions Precautions Precautions: Fall Restrictions Weight Bearing Restrictions: No      Mobility  Bed Mobility Overal bed mobility: Needs Assistance Bed Mobility: Supine to Sit     Supine to sit: Supervision     General bed mobility comments: able to come to EOB, however becomes very fatigued with exertion. Refused further attempt for mobility at this time.   Transfers                 General transfer comment: refused due to SOB  Ambulation/Gait                Stairs            Wheelchair Mobility     Modified Rankin (Stroke Patients Only)       Balance Overall balance assessment: Independent                                           Pertinent Vitals/Pain Pain Assessment: No/denies pain    Home Living Family/patient expects to be discharged to:: Private residence Living Arrangements: Spouse/significant other;Children(currently living at daughters house right now) Available Help at Discharge: Family Type of Home: House Home Access: Stairs to enter Entrance Stairs-Rails: None Secretary/administrator of Steps: 6 Home Layout: One level Home Equipment: None      Prior Function Level of Independence: Independent         Comments: wears 2L of O2 at baseline, reports no falls and was indep prior to admission. Does report that she does fatigue quickly with exertion and has difficulty performing ADLs due to low energy and SOB     Hand Dominance        Extremity/Trunk Assessment   Upper Extremity Assessment Upper Extremity Assessment: Generalized weakness(B UE grossly 4/5)    Lower Extremity Assessment Lower Extremity Assessment: Generalized weakness(B LE grossly 4/5)       Communication   Communication: No difficulties  Cognition Arousal/Alertness: Awake/alert Behavior During Therapy: WFL for tasks assessed/performed Overall Cognitive Status: Within Functional Limits for tasks assessed  General Comments      Exercises Other Exercises Other Exercises: supine ther-ex performed on B LE including AP, SLRs, and hip abd/add. ALl ther-ex performed x 10 reps with safe technique. Vitals remained stable throughout   Assessment/Plan    PT Assessment Patient needs continued PT services  PT Problem List Decreased strength;Decreased activity tolerance;Cardiopulmonary status limiting activity       PT Treatment Interventions Gait training;Therapeutic exercise;Balance training;DME instruction     PT Goals (Current goals can be found in the Care Plan section)  Acute Rehab PT Goals Patient Stated Goal: to get stronger PT Goal Formulation: With patient Time For Goal Achievement: 12/28/19 Potential to Achieve Goals: Good    Frequency Min 2X/week   Barriers to discharge        Co-evaluation               AM-PAC PT "6 Clicks" Mobility  Outcome Measure Help needed turning from your back to your side while in a flat bed without using bedrails?: A Little Help needed moving from lying on your back to sitting on the side of a flat bed without using bedrails?: A Little Help needed moving to and from a bed to a chair (including a wheelchair)?: A Little Help needed standing up from a chair using your arms (e.g., wheelchair or bedside chair)?: A Little Help needed to walk in hospital room?: A Lot Help needed climbing 3-5 steps with a railing? : A Lot 6 Click Score: 16    End of Session Equipment Utilized During Treatment: Oxygen Activity Tolerance: Patient limited by fatigue Patient left: in bed(seated at EOB) Nurse Communication: Mobility status PT Visit Diagnosis: Muscle weakness (generalized) (M62.81);Difficulty in walking, not elsewhere classified (R26.2)    Time: 3825-0539 PT Time Calculation (min) (ACUTE ONLY): 31 min   Charges:   PT Evaluation $PT Eval Moderate Complexity: 1 Mod PT Treatments $Therapeutic Exercise: 8-22 mins        Greggory Stallion, PT, DPT [phone]   Deroy Noah 12/14/2019, 4:32 PM

## 2019-12-14 NOTE — ED Notes (Signed)
MD Wieting made aware of pt's CBG of 522. Order placed for 28 units of insulin aspart.

## 2019-12-14 NOTE — ED Notes (Signed)
Lunch tray at bedside. ?

## 2019-12-14 NOTE — Progress Notes (Signed)
Pharmacy Antibiotic Note  Marissa Luna is a 64 y.o. female admitted on 01-12-20 with pneumonia.  Pharmacy has been consulted for Vancomycin dosing. Patient was recently hospitalized for Covid and completed a course of Remdesivir.  Plan: Vancomycin 1500mg  IV once followed by Vancomycin 1000 mg IV Q 36 hrs. Goal AUC 400-550. Expected AUC: 512.1 SCr used: 1.00 Expected Cmin: 10.7    Height: 4\' 11"  (149.9 cm) Weight: 157 lb (71.2 kg) IBW/kg (Calculated) : 43.2  Temp (24hrs), Avg:98 F (36.7 C), Min:98 F (36.7 C), Max:98 F (36.7 C)  Recent Labs  Lab 01-12-20 2332  WBC 17.8*  CREATININE 1.00  LATICACIDVEN 1.4    Estimated Creatinine Clearance: 48.8 mL/min (by C-G formula based on SCr of 1 mg/dL).    No Known Allergies  Antimicrobials this admission: Ceftriaxone 1/8 >>  Azithromycin 1/8 >>  Vancomycin 1/9 >>  Dose adjustments this admission:   Microbiology results:  Thank you for allowing pharmacy to be a part of this patient's care.  3/8, PharmD, BCPS 12/14/2019 3:32 AM

## 2019-12-14 NOTE — Progress Notes (Signed)
Patient ID: Marissa Luna, female   DOB: [birth year], 64 y.o.   MRN: [id number] [facility] PROGRESS NOTE  Marissa Luna FWY:[id number] DOB: [birth year] DOA: 12/22/2019 PCP: Carlean Jews, NP  HPI/Subjective: Patient complains of shortness of breath.  She also complains of urinating an awful lot.  Even at night.  She is urinating all the time  Objective: Vitals:   12/14/19 1400 12/14/19 1554  BP: 129/73 (!) 142/75  Pulse: 99 (!) 107  Resp: 19 (!) 27  Temp:    SpO2: 97% 97%    Filed Weights   01/02/2020 2252  Weight: 71.2 kg    ROS: Review of Systems  Constitutional: Negative for chills and fever.  Eyes: Negative for blurred vision.  Respiratory: Positive for cough, shortness of breath and wheezing.   Cardiovascular: Negative for chest pain.  Gastrointestinal: Negative for abdominal pain, constipation, diarrhea, nausea and vomiting.  Genitourinary: Positive for frequency and urgency. Negative for dysuria.  Musculoskeletal: Negative for joint pain.  Neurological: Negative for dizziness and headaches.   Exam: Physical Exam  Constitutional: She is oriented to person, place, and time.  HENT:  Nose: No mucosal edema.  Mouth/Throat: No oropharyngeal exudate or posterior oropharyngeal edema.  Eyes: Pupils are equal, round, and reactive to light. Conjunctivae, EOM and lids are normal.  Neck: No JVD present. Carotid bruit is not present. No thyroid mass and no thyromegaly present.  Cardiovascular: S1 normal and S2 normal. Tachycardia present. Exam reveals no gallop.  No murmur heard. Pulses:      Dorsalis pedis pulses are 2+ on the right side and 2+ on the left side.  Respiratory: No respiratory distress. She has decreased breath sounds in the right lower field and the left lower field. She has wheezes in the right middle field and the left middle field. She has rhonchi in the right lower field and the left lower field. She has no rales.  GI: Soft. Bowel sounds are normal. There  is no abdominal tenderness.  Musculoskeletal:     Cervical back: No edema.     Right ankle: Swelling present.     Left ankle: Swelling present.  Lymphadenopathy:    She has no cervical adenopathy.  Neurological: She is alert and oriented to person, place, and time. No cranial nerve deficit.  Skin: Skin is warm. No rash noted. Nails show no clubbing.  Psychiatric: She has a normal mood and affect.      Data Reviewed: Basic Metabolic Panel: Recent Labs  Lab 12/29/2019 2332  NA 142  K 4.0  CL 107  CO2 25  GLUCOSE 217*  BUN 27*  CREATININE 1.00  CALCIUM 8.5*   Liver Function Tests: Recent Labs  Lab 12/18/2019 2332  AST 31  ALT 30  ALKPHOS 83  BILITOT 0.7  PROT 7.6  ALBUMIN 2.8*   CBC: Recent Labs  Lab 12/15/2019 2332 12/14/19 0344  WBC 17.8* 17.6*  NEUTROABS 15.7*  --   HGB 13.4 13.1  HCT 42.8 41.9  MCV 91.3 90.9  PLT 211 196     Recent Results (from the past 240 hour(s))  Blood Culture (routine x 2)     Status: None (Preliminary result)   Collection Time: 12/19/2019 11:32 PM   Specimen: Right Antecubital; Blood  Result Value Ref Range Status   Specimen Description RIGHT ANTECUBITAL  Final   Special Requests Blood Culture adequate volume  Final   Culture   Final    NO GROWTH < 12 HOURS Performed at  [facility], [address] [place], [place] [address]    Report Status PENDING  Incomplete  Blood Culture (routine x 2)     Status: None (Preliminary result)   Collection Time: 01/01/2020 11:33 PM   Specimen: Left Antecubital; Blood  Result Value Ref Range Status   Specimen Description LEFT ANTECUBITAL  Final   Special Requests   Final    Blood Culture results may not be optimal due to an inadequate volume of blood received in culture bottles   Culture   Final    NO GROWTH < 12 HOURS Performed at [facility], [address], [place], [place] [address]    Report Status PENDING  Incomplete     Studies: CT Angio Chest PE W and/or Wo  Contrast  Result Date: 12/14/2019 CLINICAL DATA:  Shortness of breath EXAM: CT ANGIOGRAPHY CHEST WITH CONTRAST TECHNIQUE: Multidetector CT imaging of the chest was performed using the standard protocol during bolus administration of intravenous contrast. Multiplanar CT image reconstructions and MIPs were obtained to evaluate the vascular anatomy. CONTRAST:  147mL OMNIPAQUE IOHEXOL 350 MG/ML SOLN COMPARISON:  Apr 22, 2010 FINDINGS: Cardiovascular: There is a optimal opacification of the pulmonary arteries. There is no central,segmental, or subsegmental filling defects within the pulmonary arteries. There is mild cardiomegaly. No evidence of right ventricular heart strain. There is normal three-vessel brachiocephalic anatomy without proximal stenosis. The thoracic aorta is normal in appearance. Mediastinum/Nodes: Again noted are extensively calcified mediastinal and bilateral hilar lymph nodes. Thyroid gland, trachea, and esophagus demonstrate no significant findings. Lungs/Pleura: There is extensive bilateral bronchial wall thickening and bronchiectasis with honeycombing most notable in the posterior lower lungs with subpleural bleb formation. There has been interval progression since the prior exam dating back to 20/11. There is new hazy/ground-glass opacity seen within the periphery of the bilateral lungs, left slightly greater than right. No pleural effusion is seen. Upper Abdomen: No acute abnormalities present in the visualized portions of the upper abdomen. Musculoskeletal: No chest wall abnormality. No acute or significant osseous findings. Review of the MIP images confirms the above findings. IMPRESSION: 1. No central, segmental, or subsegmental pulmonary embolism. 2. Extensive bilateral hilar and mediastinal calcified adenopathy with extensive posterior lower lung bronchiectasis, honeycombing, and subpleural bleb formation. There has been interval progression in the lung changes since the prior exam,  consistent with progression of the patient's known sarcoidosis. 3. Hazy/ground-glass opacities seen within both lungs, left greater than right, consistent with COVID pneumonia. 4. Mild cardiomegaly Electronically Signed   By: Prudencio Pair M.D.   On: 12/14/2019 01:18   DG Chest Port 1 View  Result Date: 12/19/2019 CLINICAL DATA:  64 year old female with shortness of breath. EXAM: PORTABLE CHEST 1 VIEW COMPARISON:  Chest radiograph dated 11/30/2019. FINDINGS: Background of fibrotic changes primarily involving the left mid to lower lung field. Slight increased density primarily over the left lung may be chronic but appears more prominent compared to the prior radiograph and developing infiltrate is not excluded. Clinical correlation is recommended. No pleural effusion or pneumothorax. The cardiac silhouette is within normal limits. Calcified hilar and mediastinal granuloma. No acute osseous pathology. IMPRESSION: 1. Slight increased density primarily in the left mid to lower lung field. Developing infiltrate is not excluded. Clinical correlation is recommended. 2. Calcified hilar and mediastinal granuloma. Electronically Signed   By: Anner Crete M.D.   On: 01/03/2020 23:28    Scheduled Meds: . amLODipine  10 mg Oral Daily  . ascorbic acid  500 mg  Oral Daily  . calcium-vitamin D  1 tablet Oral Daily  . enoxaparin (LOVENOX) injection  40 mg Subcutaneous Q24H  . fluticasone  2 spray Each Nare Daily  . fluticasone furoate-vilanterol  1 puff Inhalation Daily   And  . umeclidinium bromide  1 puff Inhalation Daily  . folic acid  1 mg Oral Daily  . furosemide  20 mg Oral Daily  . Ipratropium-Albuterol  1 puff Inhalation Q6H  . loratadine  10 mg Oral Daily  . methylPREDNISolone (SOLU-MEDROL) injection  40 mg Intravenous Q12H  . rOPINIRole  0.25 mg Oral QHS   Continuous Infusions: . azithromycin    . cefTRIAXone (ROCEPHIN)  IV Stopped (12/14/19 1144)  . [START ON 12/15/2019] vancomycin       Assessment/Plan:  1. Acute on chronic hypoxic respiratory failure.  Patient on high flow nasal cannula.  Patient initially admitted to the stepdown unit but if we can do nasal cannula on the floor I am okay with that.  Can keep oxygen saturation around 90%. 2. Bilateral lobar pneumonia.  Recent Covid infection.  On Rocephin Zithromax and vancomycin.  If MRSA PCR is negative we will get rid of the vancomycin. 3. Sarcoidosis.  Start steroids.  Continue inhalers. 4. Essential hypertension on Norvasc 5. Frequent urination all day and all night.  Start low-dose Lasix to get rid of urine during the day so she is not up all night.  Check a bladder scan.  Check a urine analysis. 6. Restless leg syndrome on Requip 7. Weakness.  Physical therapy evaluation 8. Elevated glucose.  Check a hemoglobin A1c and place on sliding scale.  Sugars likely will be higher with steroids.  Code Status:     Code Status Orders  (From admission, onward)         Start     Ordered   12/14/19 0228  Full code  Continuous     12/14/19 0232        Code Status History    Date Active Date Inactive Code Status Order ID Comments User Context   11/30/2019 1316 12/04/2019 1836 Full Code [id number]  Arnetha Courser, MD ED   01/12/2018 0118 01/15/2018 2021 Full Code [id number]  Oralia Manis, MD Inpatient   Advance Care Planning Activity     Family Communication: Spoke with daughter on the phone Disposition Plan: To be determined  Antibiotics:  Rocephin  Zithromax  Vancomycin  Time spent: 35 minutes  [employer]

## 2019-12-15 DIAGNOSIS — E119 Type 2 diabetes mellitus without complications: Secondary | ICD-10-CM

## 2019-12-15 LAB — GLUCOSE, CAPILLARY
Glucose-Capillary: 162 mg/dL — ABNORMAL HIGH (ref 70–99)
Glucose-Capillary: 297 mg/dL — ABNORMAL HIGH (ref 70–99)
Glucose-Capillary: 442 mg/dL — ABNORMAL HIGH (ref 70–99)
Glucose-Capillary: 186 mg/dL — ABNORMAL HIGH (ref 70–99)

## 2019-12-15 LAB — CREATININE, SERUM
Creatinine, Ser: 0.83 mg/dL (ref 0.44–1.00)
GFR calc Af Amer: >60 mL/min (ref >60)
GFR calc non Af Amer: >60 mL/min (ref >60)

## 2019-12-15 LAB — MRSA PCR SCREENING: MRSA by PCR: NEGATIVE

## 2019-12-15 MED ORDER — ADULT MULTIVITAMIN W/MINERALS CH
1.0000 | ORAL_TABLET | Freq: Every day | ORAL | Status: DC
  Administered 2019-12-15 – 2020-01-05 (×21): 1 via ORAL
  Filled 2019-12-15 (×22): qty 1

## 2019-12-15 MED ORDER — ENSURE ENLIVE PO LIQD
237.0000 mL | Freq: Two times a day (BID) | ORAL | Status: DC
  Administered 2019-12-15 – 2020-01-05 (×26): 237 mL via ORAL

## 2019-12-15 MED ORDER — VANCOMYCIN HCL 750 MG/150ML IV SOLN
750.0000 mg | INTRAVENOUS | Status: DC
  Administered 2019-12-15: 750 mg via INTRAVENOUS
  Filled 2019-12-15 (×2): qty 150

## 2019-12-15 MED ORDER — INSULIN ASPART 100 UNIT/ML ~~LOC~~ SOLN
24.0000 [IU] | Freq: Once | SUBCUTANEOUS | Status: AC
  Administered 2019-12-15: 24 [IU] via SUBCUTANEOUS
  Filled 2019-12-15: qty 1

## 2019-12-15 NOTE — Progress Notes (Signed)
Patient ID: Marissa Luna, female   DOB: 04/20/1956, 64 y.o.   MRN: [id number] [facility] PROGRESS NOTE  ARCOLA FRESHOUR KLK:[id number] DOB: 1956-08-14 DOA: December 29, 2019 PCP: Carlean Jews, NP  HPI/Subjective: Patient still with shortness of breath.  Breathing is a little bit better than yesterday.  Still with some cough and congestion.  Still requiring quite a bit of high flow nasal cannula at this time.  Only urinated twice last night.  Objective: Vitals:   12/14/19 2105 12/15/19 0447  BP: 122/63 126/72  Pulse: 100 93  Resp: 18 20  Temp: 98 F (36.7 C) 97.8 F (36.6 C)  SpO2: 97% 97%    Filed Weights   29-Dec-2019 2252  Weight: 71.2 kg    ROS: Review of Systems  Constitutional: Negative for chills and fever.  Eyes: Negative for blurred vision.  Respiratory: Positive for cough, shortness of breath and wheezing.   Cardiovascular: Negative for chest pain.  Gastrointestinal: Negative for abdominal pain, constipation, diarrhea, nausea and vomiting.  Genitourinary: Positive for frequency and urgency. Negative for dysuria.  Musculoskeletal: Negative for joint pain.  Neurological: Negative for dizziness and headaches.   Exam: Physical Exam  Constitutional: She is oriented to person, place, and time.  HENT:  Nose: No mucosal edema.  Mouth/Throat: No oropharyngeal exudate or posterior oropharyngeal edema.  Eyes: Pupils are equal, round, and reactive to light. Conjunctivae, EOM and lids are normal.  Neck: No JVD present. Carotid bruit is not present. No thyroid mass and no thyromegaly present.  Cardiovascular: S1 normal and S2 normal. Exam reveals no gallop.  No murmur heard. Respiratory: No respiratory distress. She has decreased breath sounds in the right lower field and the left lower field. She has wheezes in the right middle field and the left middle field. She has rhonchi in the right lower field and the left lower field. She has no rales.  GI: Soft. Bowel sounds are normal.  There is no abdominal tenderness.  Musculoskeletal:     Cervical back: No edema.     Right ankle: Swelling present.     Left ankle: Swelling present.  Lymphadenopathy:    She has no cervical adenopathy.  Neurological: She is alert and oriented to person, place, and time. No cranial nerve deficit.  Skin: Skin is warm. No rash noted. Nails show no clubbing.  Psychiatric: She has a normal mood and affect.      Data Reviewed: Basic Metabolic Panel: Recent Labs  Lab Dec 29, 2019 2332 12/15/19 0511  NA 142  --   K 4.0  --   CL 107  --   CO2 25  --   GLUCOSE 217*  --   BUN 27*  --   CREATININE 1.00 0.83  CALCIUM 8.5*  --    Liver Function Tests: Recent Labs  Lab Dec 29, 2019 2332  AST 31  ALT 30  ALKPHOS 83  BILITOT 0.7  PROT 7.6  ALBUMIN 2.8*   CBC: Recent Labs  Lab 12/29/19 2332 12/14/19 0344  WBC 17.8* 17.6*  NEUTROABS 15.7*  --   HGB 13.4 13.1  HCT 42.8 41.9  MCV 91.3 90.9  PLT 211 196     Recent Results (from the past 240 hour(s))  Blood Culture (routine x 2)     Status: None (Preliminary result)   Collection Time: 2019/12/29 11:32 PM   Specimen: Right Antecubital; Blood  Result Value Ref Range Status   Specimen Description RIGHT ANTECUBITAL  Final   Special Requests Blood Culture adequate  volume  Final   Culture   Final    NO GROWTH 2 DAYS Performed at [facility], [address] [place], [place] [address]    Report Status PENDING  Incomplete  Blood Culture (routine x 2)     Status: None (Preliminary result)   Collection Time: 01/04/2020 11:33 PM   Specimen: Left Antecubital; Blood  Result Value Ref Range Status   Specimen Description LEFT ANTECUBITAL  Final   Special Requests   Final    Blood Culture results may not be optimal due to an inadequate volume of blood received in culture bottles   Culture   Final    NO GROWTH 2 DAYS Performed at [facility], [address] [place], [place] [address]    Report Status PENDING   Incomplete  Respiratory Panel by PCR     Status: None   Collection Time: 12/14/19  9:05 AM   Specimen: Nasopharyngeal Swab; Respiratory  Result Value Ref Range Status   Adenovirus NOT DETECTED NOT DETECTED Final   Coronavirus 229E NOT DETECTED NOT DETECTED Final    Comment: (NOTE) The Coronavirus on the Respiratory Panel, DOES NOT test for the novel  Coronavirus (2019 nCoV)    Coronavirus HKU1 NOT DETECTED NOT DETECTED Final   Coronavirus NL63 NOT DETECTED NOT DETECTED Final   Coronavirus OC43 NOT DETECTED NOT DETECTED Final   Metapneumovirus NOT DETECTED NOT DETECTED Final   Rhinovirus / Enterovirus NOT DETECTED NOT DETECTED Final   Influenza A NOT DETECTED NOT DETECTED Final   Influenza B NOT DETECTED NOT DETECTED Final   Parainfluenza Virus 1 NOT DETECTED NOT DETECTED Final   Parainfluenza Virus 2 NOT DETECTED NOT DETECTED Final   Parainfluenza Virus 3 NOT DETECTED NOT DETECTED Final   Parainfluenza Virus 4 NOT DETECTED NOT DETECTED Final   Respiratory Syncytial Virus NOT DETECTED NOT DETECTED Final   Bordetella pertussis NOT DETECTED NOT DETECTED Final   Chlamydophila pneumoniae NOT DETECTED NOT DETECTED Final   Mycoplasma pneumoniae NOT DETECTED NOT DETECTED Final    Comment: Performed at [facility], [address] [place], [place] [address]     Studies: CT Angio Chest PE W and/or Wo Contrast  Result Date: 12/14/2019 CLINICAL DATA:  Shortness of breath EXAM: CT ANGIOGRAPHY CHEST WITH CONTRAST TECHNIQUE: Multidetector CT imaging of the chest was performed using the standard protocol during bolus administration of intravenous contrast. Multiplanar CT image reconstructions and MIPs were obtained to evaluate the vascular anatomy. CONTRAST:  OMNIPAQUE IOHEXOL 350 MG/ML SOLN COMPARISON:  Apr 22, 2010 FINDINGS: Cardiovascular: There is a optimal opacification of the pulmonary arteries. There is no central,segmental, or subsegmental filling defects within the pulmonary  arteries. There is mild cardiomegaly. No evidence of right ventricular heart strain. There is normal three-vessel brachiocephalic anatomy without proximal stenosis. The thoracic aorta is normal in appearance. Mediastinum/Nodes: Again noted are extensively calcified mediastinal and bilateral hilar lymph nodes. Thyroid gland, trachea, and esophagus demonstrate no significant findings. Lungs/Pleura: There is extensive bilateral bronchial wall thickening and bronchiectasis with honeycombing most notable in the posterior lower lungs with subpleural bleb formation. There has been interval progression since the prior exam dating back to 20/11. There is new hazy/ground-glass opacity seen within the periphery of the bilateral lungs, left slightly greater than right. No pleural effusion is seen. Upper Abdomen: No acute abnormalities present in the visualized portions of the upper abdomen. Musculoskeletal: No chest wall abnormality. No acute or significant osseous findings. Review of the MIP  images confirms the above findings. IMPRESSION: 1. No central, segmental, or subsegmental pulmonary embolism. 2. Extensive bilateral hilar and mediastinal calcified adenopathy with extensive posterior lower lung bronchiectasis, honeycombing, and subpleural bleb formation. There has been interval progression in the lung changes since the prior exam, consistent with progression of the patient's known sarcoidosis. 3. Hazy/ground-glass opacities seen within both lungs, left greater than right, consistent with COVID pneumonia. 4. Mild cardiomegaly Electronically Signed   By: Prudencio Pair M.D.   On: 12/14/2019 01:18   DG Chest Port 1 View  Result Date: 01/10/20 CLINICAL DATA:  64 year old female with shortness of breath. EXAM: PORTABLE CHEST 1 VIEW COMPARISON:  Chest radiograph dated 11/30/2019. FINDINGS: Background of fibrotic changes primarily involving the left mid to lower lung field. Slight increased density primarily over the left  lung may be chronic but appears more prominent compared to the prior radiograph and developing infiltrate is not excluded. Clinical correlation is recommended. No pleural effusion or pneumothorax. The cardiac silhouette is within normal limits. Calcified hilar and mediastinal granuloma. No acute osseous pathology. IMPRESSION: 1. Slight increased density primarily in the left mid to lower lung field. Developing infiltrate is not excluded. Clinical correlation is recommended. 2. Calcified hilar and mediastinal granuloma. Electronically Signed   By: Anner Crete M.D.   On: 2020-01-10 23:28    Scheduled Meds: . amLODipine  10 mg Oral Daily  . ascorbic acid  500 mg Oral Daily  . calcium-vitamin D  1 tablet Oral Daily  . enoxaparin (LOVENOX) injection  40 mg Subcutaneous Q24H  . feeding supplement (ENSURE ENLIVE)  237 mL Oral BID BM  . fluticasone  2 spray Each Nare Daily  . fluticasone furoate-vilanterol  1 puff Inhalation Daily   And  . umeclidinium bromide  1 puff Inhalation Daily  . folic acid  1 mg Oral Daily  . furosemide  20 mg Oral Daily  . insulin aspart  0-5 Units Subcutaneous QHS  . insulin aspart  0-9 Units Subcutaneous TID WC  . insulin glargine  10 Units Subcutaneous QHS  . Ipratropium-Albuterol  1 puff Inhalation Q6H  . loratadine  10 mg Oral Daily  . methylPREDNISolone (SOLU-MEDROL) injection  40 mg Intravenous Daily  . multivitamin with minerals  1 tablet Oral Daily  . rOPINIRole  0.25 mg Oral QHS   Continuous Infusions: . azithromycin 500 mg (12/14/19 2216)  . cefTRIAXone (ROCEPHIN)  IV 2 g (12/15/19 0855)  . vancomycin 750 mg (12/15/19 1130)    Assessment/Plan:  1. Acute on chronic hypoxic respiratory failure.  Patient on high flow nasal cannula 12 L.  Patient's pulse ox is 97% so we have the ability to taper this down.  Can keep oxygen saturation around 90%. 2. Bilateral lobar pneumonia.  Recent Covid infection.  On Rocephin Zithromax and vancomycin.  If MRSA PCR is  negative we will get rid of the vancomycin. 3. Sarcoidosis.  Continue Solu-Medrol daily dosing..  Continue inhalers.  4. 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  Started Lantus insulin.  Hemoglobin A1c elevated at 8.0.  Low carbohydrate diet discussed. 5. Essential hypertension on Norvasc 6. Restless leg syndrome on Requip 7. Weakness.  Physical therapy evaluation   Code Status:     Code Status Orders  (From admission, onward)         Start     Ordered   12/14/19 0228  Full code  Continuous     12/14/19 0232        Code Status History  Date Active Date Inactive Code Status Order ID Comments User Context   11/30/2019 1316 12/04/2019 1836 Full Code [id number]  Arnetha Courser, MD ED   01/12/2018 0118 01/15/2018 2021 Full Code [id number]  Oralia Manis, MD Inpatient   Advance Care Planning Activity     Family Communication: Spoke with daughter on the phone Disposition Plan: To be determined  Antibiotics:  Rocephin  Zithromax  Vancomycin  Time spent: 28 minutes  [employer]

## 2019-12-15 NOTE — Progress Notes (Signed)
RN notified MD pt BG was 442. Per MD RN to order one time order of 24 units of short acting insulin. RN will  Continue to assess and monitor pt.

## 2019-12-15 NOTE — Progress Notes (Signed)
Initial Nutrition Assessment  RD working remotely.  DOCUMENTATION CODES:   Obesity unspecified  INTERVENTION:   - Ensure Enlive po BID, each supplement provides 350 kcal and 20 grams of protein  - MVI with minerals daily  NUTRITION DIAGNOSIS:   Increased nutrient needs related to acute illness, catabolic illness (COVID-19) as evidenced by estimated needs.  GOAL:   Patient will meet greater than or equal to 90% of their needs  MONITOR:   PO intake, Supplement acceptance, Labs, Weight trends  REASON FOR ASSESSMENT:   Consult COPD Protocol  ASSESSMENT:   64 year old female who presented to the ED on 1/08 with increased cough, weakness, SOB. Pt diagnosed with COVID-19 on 11/30/19 and was hospitalized from 12/26-12/30/20 with pneumonia and respiratory failure secondary to COVID-19. PMH of COPD with chronic respiratory failure on home oxygen, HTN, sarcoidosis.   Pt currently on a heart healthy/carb modified diet. No meal completions recorded at this time.  Spoke with pt via phone call to room. Pt reports that she has a great appetite because of the steroids and that she is eating well. Pt reports she has a decreased sense of smell but that her sense of taste is "coming back." Pt denies any issues with appetite at this time.  Pt denies any recent weight loss and reports her UBW as 157 lbs. Weight on admission appears to be stated rather than measured. Weight trending up over the last year per chart.  Pt willing to consume Ensure Enlive given increased kcal and protein needs related to COVID. RD will also order daily MVI.  Medications reviewed and include: vitamin C, Oscal with D, folic acid, Lasix, SSI, Lantus 10 units daily, solu-medrol, IV abx  Labs reviewed: hemoglobin A1C 8.0 CBG's: 162-522 x 24 hours (trending down)  UOP: 1100 ml x 24 hours  NUTRITION - FOCUSED PHYSICAL EXAM:  Unable to complete at this time. RD working remotely.  Diet Order:   Diet Order       Diet heart healthy/carb modified Room service appropriate? Yes; Fluid consistency: Thin  Diet effective now              EDUCATION NEEDS:   Education needs have been addressed  Skin:  Skin Assessment: Reviewed RN Assessment  Last BM:  12/14/19  Height:   Ht Readings from Last 1 Encounters:  12/20/2019 4\' 11"  (1.499 m)    Weight:   Wt Readings from Last 1 Encounters:  12/30/2019 71.2 kg    Ideal Body Weight:  44.7 kg  BMI:  Body mass index is 31.71 kg/m.  Estimated Nutritional Needs:   Kcal:  1700-1900  Protein:  85-100 grams  Fluid:  >/= 1.7 L    02/10/20, MS, RD, LDN Inpatient Clinical Dietitian Pager: [phone] Weekend/After Hours: [phone]

## 2019-12-15 NOTE — Progress Notes (Signed)
Pharmacy Antibiotic Note  Marissa Luna is a 64 y.o. female admitted on 12/26/2019 with pneumonia.  Pharmacy has been consulted for Vancomycin dosing. Patient was recently hospitalized for Covid and completed a course of Remdesivir.  Plan: Change vancomycin 1000 mg IV q36h to 750 mg q24h.  Goal AUC 400-550 Expected AUC: 488 SCr used: 0.83 Expected Cmin: 12   Height: 4\' 11"  (149.9 cm) Weight: 157 lb (71.2 kg) IBW/kg (Calculated) : 43.2  Temp (24hrs), Avg:98 F (36.7 C), Min:97.8 F (36.6 C), Max:98.2 F (36.8 C)  Recent Labs  Lab 01/01/2020 2332 12/14/19 0344 12/15/19 0511  WBC 17.8* 17.6*  --   CREATININE 1.00  --  0.83  LATICACIDVEN 1.4  --   --     Estimated Creatinine Clearance: 58.8 mL/min (by C-G formula based on SCr of 0.83 mg/dL).    No Known Allergies  Antimicrobials this admission: Ceftriaxone 1/8 >>  Azithromycin 1/8 >>  Vancomycin 1/9 >>  Dose adjustments this admission: 1/10 vanc 1000 mg q36h >> 750 mg q24h  Microbiology results:  Thank you for allowing pharmacy to be a part of this patient's care.  3/9, PharmD 12/15/2019 9:32 AM

## 2019-12-16 DIAGNOSIS — J9621 Acute and chronic respiratory failure with hypoxia: Secondary | ICD-10-CM

## 2019-12-16 DIAGNOSIS — J189 Pneumonia, unspecified organism: Secondary | ICD-10-CM

## 2019-12-16 DIAGNOSIS — D860 Sarcoidosis of lung: Secondary | ICD-10-CM

## 2019-12-16 LAB — EXPECTORATED SPUTUM ASSESSMENT W GRAM STAIN, RFLX TO RESP C

## 2019-12-16 LAB — GLUCOSE, CAPILLARY
Glucose-Capillary: 163 mg/dL — ABNORMAL HIGH (ref 70–99)
Glucose-Capillary: 257 mg/dL — ABNORMAL HIGH (ref 70–99)
Glucose-Capillary: 380 mg/dL — ABNORMAL HIGH (ref 70–99)
Glucose-Capillary: 164 mg/dL — ABNORMAL HIGH (ref 70–99)

## 2019-12-16 LAB — URINALYSIS, COMPLETE (UACMP) WITH MICROSCOPIC
Bacteria, UA: NONE SEEN
Bilirubin Urine: NEGATIVE
Glucose, UA: 50 mg/dL — AB
Hgb urine dipstick: NEGATIVE
Ketones, ur: NEGATIVE mg/dL
Leukocytes,Ua: NEGATIVE
Nitrite: NEGATIVE
Protein, ur: NEGATIVE mg/dL
Specific Gravity, Urine: 1.018 (ref 1.005–1.030)
pH: 7 (ref 5.0–8.0)

## 2019-12-16 LAB — CREATININE, SERUM
Creatinine, Ser: 0.97 mg/dL (ref 0.44–1.00)
GFR calc Af Amer: >60 mL/min (ref >60)
GFR calc non Af Amer: >60 mL/min (ref >60)

## 2019-12-16 MED ORDER — INSULIN ASPART 100 UNIT/ML ~~LOC~~ SOLN
6.0000 [IU] | Freq: Three times a day (TID) | SUBCUTANEOUS | Status: DC
  Administered 2019-12-16 – 2019-12-17 (×3): 6 [IU] via SUBCUTANEOUS
  Filled 2019-12-16 (×3): qty 1

## 2019-12-16 MED ORDER — SALINE SPRAY 0.65 % NA SOLN
1.0000 | NASAL | Status: DC | PRN
  Administered 2020-01-03: 1 via NASAL
  Filled 2019-12-16 (×4): qty 44

## 2019-12-16 MED ORDER — SODIUM CHLORIDE 0.9 % IV SOLN
INTRAVENOUS | Status: DC | PRN
  Administered 2019-12-16 (×2): 250 mL via INTRAVENOUS
  Administered 2019-12-18: 20 mL via INTRAVENOUS

## 2019-12-16 NOTE — Progress Notes (Signed)
Inpatient Diabetes Program Recommendations  AACE/ADA: New Consensus Statement on Inpatient Glycemic Control (2015)  Target Ranges:  Prepandial:   less than 140 mg/dL      Peak postprandial:   less than 180 mg/dL (1-2 hours)      Critically ill patients:  140 - 180 mg/dL   Results for Marissa Luna, Marissa Luna (MRN [id number]) as of 12/16/2019 09:56  Ref. Range 12/15/2019 07:11 12/15/2019 11:24 12/15/2019 16:45 12/15/2019 21:02  Glucose-Capillary Latest Ref Range: 70 - 99 mg/dL 400 (H)  2 units NOVOLOG  297 (H)  5 units NOVOLOG  442 (H)  9 units NOVOLOG +  24 units NOVOLOG given at 7:05pm  186 (H)    10 units LANTUS   Results for Marissa Luna, Marissa Luna (MRN [id number]) as of 12/16/2019 09:56  Ref. Range 12/16/2019 08:12  Glucose-Capillary Latest Ref Range: 70 - 99 mg/dL 326 (H)  2 units NOVOLOG    Results for Marissa Luna, Marissa Luna (MRN [id number]) as of 12/16/2019 09:56  Ref. Range 12/14/2019 03:44  Hemoglobin A1C Latest Ref Range: 4.8 - 5.6 % 8.0 (H)     Admit with: Acute on chronic hypoxic respiratory failure/ Bilateral lobar pneumonia  History: Sarcoidosis, COPD, COVID Infection (hospitalized from 11/30/19 thru 12/04/19)   Current Orders: Lantus 10 units QHS      Novolog Sensitive Correction Scale/ SSI (0-9 units) TID AC + HS      Getting Solumedrol 40 mg Daily.    MD- Note new diagnosis of Diabetes this admission--A1c elevated to 8%.  Plan to order educational materials for patient and call by phone to discuss once patient appropriate and weaned down on O2 requirements.  Will also order RD consult.  While getting Steroids recommend continuation of Insulin.  May be able to d/c home on oral medication regimen at time discharge.  MD- Please consider adding Novolog Meal Coverage while patient remains on IV Steroids and is getting Ensure Enlive PO supplements which contain 45 grams of Carbohydrates per each container: Novolog 6 units TID with meals    --Will follow patient during  hospitalization--  Ambrose Finland RN, MSN, CDE Diabetes Coordinator Inpatient Glycemic Control Team Team Pager: [phone] (8a-5p)

## 2019-12-16 NOTE — Progress Notes (Signed)
Patient ID: Marissa Luna, female   DOB: 1956-06-22, 64 y.o.   MRN: [id number] [facility] PROGRESS NOTE  Marissa Luna [facility]:[id number] DOB: June 01, 1956 DOA: 12/29/2019 PCP: Ronnell Freshwater, NP  HPI/Subjective: Patient gradually feeling a little bit better.  Still with some shortness of breath and cough.  Patient still urinating a lot but not as much at night.  Objective: Vitals:   12/15/19 2105 12/16/19 0519  BP: 123/60 127/69  Pulse: (!) 106 (!) 103  Resp: 16 20  Temp: 98.7 F (37.1 C) 99.2 F (37.3 C)  SpO2: 95% 93%    Filed Weights   12/18/2019 2252  Weight: 71.2 kg    ROS: Review of Systems  Constitutional: Negative for chills and fever.  Eyes: Negative for blurred vision.  Respiratory: Positive for cough and shortness of breath.   Cardiovascular: Negative for chest pain.  Gastrointestinal: Negative for abdominal pain, constipation, diarrhea, nausea and vomiting.  Genitourinary: Positive for frequency. Negative for dysuria.  Musculoskeletal: Negative for joint pain.  Neurological: Negative for dizziness and headaches.   Exam: Physical Exam  Constitutional: She is oriented to person, place, and time.  HENT:  Nose: No mucosal edema.  Mouth/Throat: No oropharyngeal exudate or posterior oropharyngeal edema.  Eyes: Pupils are equal, round, and reactive to light. Conjunctivae, EOM and lids are normal.  Neck: No JVD present. Carotid bruit is not present. No thyroid mass and no thyromegaly present.  Cardiovascular: S1 normal and S2 normal. Exam reveals no gallop.  No murmur heard. Respiratory: No respiratory distress. She has decreased breath sounds in the right lower field and the left lower field. She has no wheezes. She has rhonchi in the right lower field and the left lower field. She has no rales.  GI: Soft. Bowel sounds are normal. There is no abdominal tenderness.  Musculoskeletal:     Cervical back: No edema.     Right ankle: Swelling present.     Left ankle:  Swelling present.  Lymphadenopathy:    She has no cervical adenopathy.  Neurological: She is alert and oriented to person, place, and time. No cranial nerve deficit.  Skin: Skin is warm. No rash noted. Nails show no clubbing.  Psychiatric: She has a normal mood and affect.      Data Reviewed: Basic Metabolic Panel: Recent Labs  Lab 12/27/2019 2332 12/15/19 0511 12/16/19 0508  NA 142  --   --   K 4.0  --   --   CL 107  --   --   CO2 25  --   --   GLUCOSE 217*  --   --   BUN 27*  --   --   CREATININE 1.00 0.83 0.97  CALCIUM 8.5*  --   --    Liver Function Tests: Recent Labs  Lab 12/17/2019 2332  AST 31  ALT 30  ALKPHOS 83  BILITOT 0.7  PROT 7.6  ALBUMIN 2.8*   CBC: Recent Labs  Lab 12/06/2019 2332 12/14/19 0344  WBC 17.8* 17.6*  NEUTROABS 15.7*  --   HGB 13.4 13.1  HCT 42.8 41.9  MCV 91.3 90.9  PLT 211 196     Recent Results (from the past 240 hour(s))  Blood Culture (routine x 2)     Status: None (Preliminary result)   Collection Time: 12/11/2019 11:32 PM   Specimen: Right Antecubital; Blood  Result Value Ref Range Status   Specimen Description RIGHT ANTECUBITAL  Final   Special Requests Blood Culture adequate  volume  Final   Culture   Final    NO GROWTH 3 DAYS Performed at [facility], [address] [place], [place] [address]    Report Status PENDING  Incomplete  Blood Culture (routine x 2)     Status: None (Preliminary result)   Collection Time: 12/10/2019 11:33 PM   Specimen: Left Antecubital; Blood  Result Value Ref Range Status   Specimen Description LEFT ANTECUBITAL  Final   Special Requests   Final    Blood Culture results may not be optimal due to an inadequate volume of blood received in culture bottles   Culture   Final    NO GROWTH 3 DAYS Performed at [facility], [address] [place], [place] [address]    Report Status PENDING  Incomplete  Respiratory Panel by PCR     Status: None   Collection Time: 12/14/19  9:05  AM   Specimen: Nasopharyngeal Swab; Respiratory  Result Value Ref Range Status   Adenovirus NOT DETECTED NOT DETECTED Final   Coronavirus 229E NOT DETECTED NOT DETECTED Final    Comment: (NOTE) The Coronavirus on the Respiratory Panel, DOES NOT test for the novel  Coronavirus (2019 nCoV)    Coronavirus HKU1 NOT DETECTED NOT DETECTED Final   Coronavirus NL63 NOT DETECTED NOT DETECTED Final   Coronavirus OC43 NOT DETECTED NOT DETECTED Final   Metapneumovirus NOT DETECTED NOT DETECTED Final   Rhinovirus / Enterovirus NOT DETECTED NOT DETECTED Final   Influenza A NOT DETECTED NOT DETECTED Final   Influenza B NOT DETECTED NOT DETECTED Final   Parainfluenza Virus 1 NOT DETECTED NOT DETECTED Final   Parainfluenza Virus 2 NOT DETECTED NOT DETECTED Final   Parainfluenza Virus 3 NOT DETECTED NOT DETECTED Final   Parainfluenza Virus 4 NOT DETECTED NOT DETECTED Final   Respiratory Syncytial Virus NOT DETECTED NOT DETECTED Final   Bordetella pertussis NOT DETECTED NOT DETECTED Final   Chlamydophila pneumoniae NOT DETECTED NOT DETECTED Final   Mycoplasma pneumoniae NOT DETECTED NOT DETECTED Final    Comment: Performed at [facility], [address], [place], [place] [address]  MRSA PCR Screening     Status: None   Collection Time: 12/15/19  2:39 PM   Specimen: Nasal Mucosa; Nasopharyngeal  Result Value Ref Range Status   MRSA by PCR NEGATIVE NEGATIVE Final    Comment:        The GeneXpert MRSA Assay (FDA approved for NASAL specimens only), is one component of a comprehensive MRSA colonization surveillance program. It is not intended to diagnose MRSA infection nor to guide or monitor treatment for MRSA infections. Performed at [facility], [address] [place], [place] [address]      Scheduled Meds: . amLODipine  10 mg Oral Daily  . ascorbic acid  500 mg Oral Daily  . calcium-vitamin D  1 tablet Oral Daily  . enoxaparin (LOVENOX) injection  40 mg Subcutaneous  Q24H  . feeding supplement (ENSURE ENLIVE)  237 mL Oral BID BM  . fluticasone  2 spray Each Nare Daily  . fluticasone furoate-vilanterol  1 puff Inhalation Daily   And  . umeclidinium bromide  1 puff Inhalation Daily  . folic acid  1 mg Oral Daily  . furosemide  20 mg Oral Daily  . insulin aspart  0-5 Units Subcutaneous QHS  . insulin aspart  0-9 Units Subcutaneous TID WC  . insulin glargine  10 Units Subcutaneous QHS  . Ipratropium-Albuterol  1 puff  Inhalation Q6H  . loratadine  10 mg Oral Daily  . methylPREDNISolone (SOLU-MEDROL) injection  40 mg Intravenous Daily  . multivitamin with minerals  1 tablet Oral Daily  . rOPINIRole  0.25 mg Oral QHS   Continuous Infusions: . sodium chloride Stopped (12/16/19 1036)  . azithromycin Stopped (12/15/19 2310)  . cefTRIAXone (ROCEPHIN)  IV Stopped (12/16/19 0240)    Assessment/Plan:  1. Acute on chronic hypoxic respiratory failure.  Patient on high flow nasal cannula 7 L.  Pulse ox is 92% so still have a little room to taper down. 2. Bilateral lobar pneumonia.  Recent Covid infection.  On Rocephin Zithromax.  Vancomycin discontinued yesterday with MRSA PCR being negative. 3. Sarcoidosis.  Continue Solu-Medrol daily dosing.  Continue inhalers.  Patient requests Dr. Park Breed her pulmonologist see her while here. 4. 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  Started Lantus insulin.  Hemoglobin A1c elevated at 8.0.  Low carbohydrate diet discussed. 5. Essential hypertension on Norvasc 6. Restless leg syndrome on Requip 7. Weakness.  Physical therapy evaluation appreciated.  Home health PT recommended.   Code Status:     Code Status Orders  (From admission, onward)         Start     Ordered   12/14/19 0228  Full code  Continuous     12/14/19 0232        Code Status History    Date Active Date Inactive Code Status Order ID Comments User Context   11/30/2019 1316 12/04/2019 1836 Full Code [id number]  Arnetha Courser, MD ED    01/12/2018 0118 01/15/2018 2021 Full Code [id number]  Oralia Manis, MD Inpatient   Advance Care Planning Activity     Family Communication: Spoke with daughter on the phone Disposition Plan: Needs to be off high flow nasal cannula prior to any disposition  Antibiotics:  Rocephin  Zithromax  Time spent: 28 minutes  Junaid [employer]

## 2019-12-16 NOTE — Progress Notes (Signed)
Ch called Pt on the phone a couple hours earlier upon receiving a referral from Chaplain. Pt was on another call then. Pt did not pick up the phone call this time.   12/16/19 1947  Clinical Encounter Type  Visited With Patient  Visit Type Follow-up  Referral From Chaplain  Consult/Referral To Chaplain  Spiritual Encounters  Spiritual Needs Prayer;Emotional  Stress Factors  Patient Stress Factors Health changes

## 2019-12-16 NOTE — Progress Notes (Signed)
Physical Therapy Treatment Patient Details Name: Marissa Luna MRN: [id number] DOB: Mar 06, 1956 Today's Date: 12/16/2019    History of Present Illness Pt admitted for acute on chronic respiratory failure with hypoxia secondary to Covid. She complains of cough, weakness, and SOB symptoms. History includes sarcodosis, COPD, CRD on 2L of O2 chronic, HTN and recent hospital stay for covid 12/26- 12/30    PT Comments    Pt is making gradual progress towards goals. Does appear to be breathing easier this date, currently on 7L of HFNC. Still limited in exertion by SOB symptoms. UPon arrival, tele box battery dead, unable to monitor HR/O2 sats with exertion. Called out to RN station for replacement battery, none was brought. Limited session due to inability to carefully monitor sats. Used physical assessment to monitor exertion. Becomes SOB with ambulation around room to recliner. Was able to transfer on/off BSC safely and generally seems stronger than previous session. Will continue to monitor as able.   Follow Up Recommendations  Home health PT;Supervision for mobility/OOB     Equipment Recommendations  Rolling walker with 5" wheels    Recommendations for Other Services       Precautions / Restrictions Precautions Precautions: Fall Restrictions Weight Bearing Restrictions: No    Mobility  Bed Mobility Overal bed mobility: Needs Assistance Bed Mobility: Supine to Sit     Supine to sit: Supervision     General bed mobility comments: improved speed to come to EOB. SLight SOB symptoms with exertion. Cued for energy conservation  Transfers Overall transfer level: Needs assistance Equipment used: 1 person hand held assist Transfers: Sit to/from Stand Sit to Stand: Min guard         General transfer comment: safe technique  Ambulation/Gait Ambulation/Gait assistance: Min guard Gait Distance (Feet): 20 Feet Assistive device: 1 person hand held assist Gait Pattern/deviations:  Step-through pattern     General Gait Details: ambulated around bed to recliner. Very SOB with further ambulation deferred. Safe technique although was reaching out for furniture. Discussed potential RW use   Stairs             Wheelchair Mobility    Modified Rankin (Stroke Patients Only)       Balance Overall balance assessment: Independent                                          Cognition Arousal/Alertness: Awake/alert Behavior During Therapy: WFL for tasks assessed/performed Overall Cognitive Status: Within Functional Limits for tasks assessed                                        Exercises Other Exercises Other Exercises: transfer to [facility] for BM. Able to perform self hygiene with supervision. Requests a bath today, RN notified.    General Comments        Pertinent Vitals/Pain Pain Assessment: No/denies pain    Home Living                      Prior Function            PT Goals (current goals can now be found in the care plan section) Acute Rehab PT Goals Patient Stated Goal: to get stronger PT Goal Formulation: With patient Time For Goal Achievement: 12/28/19 Potential to Achieve  Goals: Good Progress towards PT goals: Progressing toward goals    Frequency    Min 2X/week      PT Plan Current plan remains appropriate    Co-evaluation              AM-PAC PT "6 Clicks" Mobility   Outcome Measure  Help needed turning from your back to your side while in a flat bed without using bedrails?: A Little Help needed moving from lying on your back to sitting on the side of a flat bed without using bedrails?: A Little Help needed moving to and from a bed to a chair (including a wheelchair)?: A Little Help needed standing up from a chair using your arms (e.g., wheelchair or bedside chair)?: A Little Help needed to walk in hospital room?: A Little Help needed climbing 3-5 steps with a railing? : A  Lot 6 Click Score: 17    End of Session Equipment Utilized During Treatment: Gait belt;Oxygen Activity Tolerance: Patient limited by fatigue Patient left: in chair;with chair alarm set Nurse Communication: Mobility status PT Visit Diagnosis: Muscle weakness (generalized) (M62.81);Difficulty in walking, not elsewhere classified (R26.2)     Time: 1110-1140 PT Time Calculation (min) (ACUTE ONLY): 30 min  Charges:  $Gait Training: 8-22 mins $Therapeutic Activity: 8-22 mins                     Elizabeth Palau, PT, DPT [phone]    Dinisha Cai 12/16/2019, 4:31 PM

## 2019-12-16 NOTE — TOC Initial Note (Signed)
Transition of Care [facility]) - Initial/Assessment Note    Patient Details  Name: Marissa Luna MRN: [id number] Date of Birth: [birth year]  [facility]) CM/SW Contact:    Chapman Fitch, RN Phone Number: 12/16/2019, 4:16 PM  Clinical Narrative:                 RNCM assessment complete.  Full note to follow         Patient Goals and CMS Choice        Expected Discharge Plan and Services                                                Prior Living Arrangements/Services                       Activities of Daily Living Home Assistive Devices/Equipment: Dan Humphreys (specify type) ADL Screening (condition at time of admission) Patient's cognitive ability adequate to safely complete daily activities?: Yes Is the patient deaf or have difficulty hearing?: No Does the patient have difficulty seeing, even when wearing glasses/contacts?: No Does the patient have difficulty concentrating, remembering, or making decisions?: No Patient able to express need for assistance with ADLs?: Yes Does the patient have difficulty dressing or bathing?: Yes Independently performs ADLs?: No Communication: Independent Dressing (OT): Needs assistance Is this a change from baseline?: Pre-admission baseline Grooming: Needs assistance Is this a change from baseline?: Pre-admission baseline Feeding: Independent Bathing: Needs assistance Is this a change from baseline?: Pre-admission baseline Toileting: Needs assistance Is this a change from baseline?: Pre-admission baseline In/Out Bed: Needs assistance Is this a change from baseline?: Pre-admission baseline Walks in Home: Needs assistance Is this a change from baseline?: Pre-admission baseline Does the patient have difficulty walking or climbing stairs?: Yes Weakness of Legs: Both Weakness of Arms/Hands: None  Permission Sought/Granted                  Emotional Assessment              Admission  diagnosis:  Acute respiratory failure (HCC) [J96.00] Acute respiratory failure with hypoxia (HCC) [J96.01] Multifocal pneumonia [J18.9] Patient Active Problem List   Diagnosis Date Noted  . Type 2 diabetes mellitus without complication, without long-term current use of insulin (HCC)   . Acute on chronic respiratory failure with hypoxia (HCC) 12/14/2019  . Healthcare-associated pneumonia 12/14/2019  . History of COVID-19 12/14/2019  . Acute respiratory failure (HCC) 12/14/2019  . Multifocal pneumonia   . Frequent urination   . Elevated glucose   . COVID-19 11/30/2019  . Unsatisfactory cervical Papanicolaou smear 10/28/2019  . Pain in both lower extremities 06/30/2019  . Restless leg syndrome 12/20/2018  . Oxygen dependent 10/17/2018  . Weakness 10/17/2018  . Abnormal weight gain 10/17/2018  . Sarcoidosis   . Need for prophylactic vaccination against Streptococcus pneumoniae (pneumococcus) 07/11/2018  . Routine cervical smear 03/25/2018  . Dysuria 03/25/2018  . Chronic respiratory failure with hypoxia (HCC) 03/13/2018  . Acute bronchitis with COPD (HCC) 02/22/2018  . Lymphocytosis 02/22/2018  . Abnormal kidney function 02/22/2018  . Sepsis (HCC) 01/11/2018  . Community acquired pneumonia of left lung 01/11/2018  . Influenza A 01/11/2018  . 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 Hypoxia 01/08/2018  . Allergic rhinitis, unspecified 01/08/2018  . Essential hypertension 01/08/2018  . Sarcoidosis of skin  01/08/2018  . SOB (shortness of breath) 01/08/2018   PCP:  Ronnell Freshwater, NP Pharmacy:   [id number] - [place], [place] - [address] [place] [address] Phone: [phone] Fax: [phone]     Social Determinants of Health (SDOH) Interventions    Readmission Risk Interventions No flowsheet data found.

## 2019-12-16 NOTE — Consult Note (Signed)
Pulmonary Critical Care  Initial Consult Note  Marissa Luna FGH:[id number] DOB: 03/03/1956 DOA: 12/25/2019  Referring physician: Dr. Bobbye Charleston  Chief Complaint: Covid positive  HPI: Marissa Luna is a 64 y.o. female with a past medical history significant for sarcoidosis who is seen in the office by myself.  She presented to the hospital back in December with a pneumonia and was diagnosed with COVID-19 pneumonia at that time.  Patient was treated with remdesivir for 5 days along with Decadron.  Discharged home came back to the hospital.  On January 9 with complaints of increasing cough weakness and also shortness of breath.  At that time she was noted to be significantly hypoxic with saturations in the 80% range.  Patient was placed on 100% nonrebreather with some improvement and then transitioned over to 7 L oxygen.  She is also having increased white count.  Right now she states that she is having no shortness of breath she has some cough and sees feels congestion.  She is stating that she is feeling a little bit better than she did when she came into the hospital but still has a cough noted.  Review of Systems:  Constitutional:  No weight loss, night sweats, Fevers, chills, fatigue.  HEENT:  No headaches, nasal congestion, post nasal drip,  Cardio-vascular:  No chest pain, Orthopnea, PND, swelling in lower extremities, anasarca, dizziness, palpitations  GI:  No heartburn, indigestion, abdominal pain, nausea, vomiting, diarrhea  Resp:  +shortness of breath. +productive cough, No coughing up of blood.No wheezing Skin:  no rash or lesions.  Musculoskeletal:  No joint pain or swelling.   Remainder ROS performed and is unremarkable other than noted in HPI  Past Medical History:  Diagnosis Date  . Asthma   . Environmental allergies   . Hypertension   . Sarcoidosis    Past Surgical History:  Procedure Laterality Date  . CHOLECYSTECTOMY    . COLONOSCOPY N/A 10/26/2015   Procedure:  COLONOSCOPY;  Surgeon: Hulen Luster, MD;  Location: [facility] ENDOSCOPY;  Service: Gastroenterology;  Laterality: N/A;   Social History:  reports that she has never smoked. She has never used smokeless tobacco. She reports that she does not drink alcohol or use drugs.  No Known Allergies  Family History  Problem Relation Age of Onset  . Lung cancer Mother   . Colon cancer Father   . Diabetes Sister   . Breast cancer Sister     Prior to Admission medications   Medication Sig Start Date End Date Taking? Authorizing Provider  albuterol (PROVENTIL) (2.5 MG/3ML) 0.083% nebulizer solution Take 3 mLs (2.5 mg total) by nebulization every 6 (six) hours as needed. Patient taking differently: Take 2.5 mg by nebulization every 6 (six) hours as needed for wheezing or shortness of breath.  06/12/18  Yes Boscia, Heather E, NP  albuterol (VENTOLIN HFA) 108 (90 Base) MCG/ACT inhaler INHALE 2 PUFFS 4 TIMES A DAY AS NEEDED FOR WHEEZING Patient taking differently: Inhale 2 puffs into the lungs every 6 (six) hours as needed for wheezing.  10/08/19  Yes Boscia, Heather E, NP  amLODipine (NORVASC) 10 MG tablet Take 1 tablet (10 mg total) by mouth daily. 10/08/19  Yes Boscia, Greer Ee, NP  azithromycin (ZITHROMAX) 250 MG tablet Take as directed Patient taking differently: Take 250-500 mg by mouth daily. Take 2 tablets (500mg ) on day 1 then 1 tablet (250mg ) on days 2-5 12/10/19  Yes Scarboro, Audie Clear, NP  Calcium Carbonate-Vitamin D 600-400 MG-UNIT tablet  Take 1 tablet by mouth daily.    Yes [provider]  cetirizine (ZYRTEC) 10 MG tablet Take 10 mg by mouth.   Yes [provider]  fluticasone (FLONASE) 50 MCG/ACT nasal spray Place 2 sprays into the nose daily.    Yes [provider]  guaiFENesin-dextromethorphan (ROBITUSSIN DM) 100-10 MG/5ML syrup Take 10 mLs by mouth every 4 (four) hours as needed for cough. 12/04/19  Yes Sreenath, Sudheer B, MD  predniSONE (DELTASONE) 5 MG tablet TAKE 1 TABLET BY  MOUTH EVERY DAY WITH BREAKFAST Patient taking differently: Take 5 mg by mouth daily with breakfast.  08/09/19  Yes Scarboro, Coralee North, NP  rOPINIRole (REQUIP) 0.25 MG tablet Take 1 tablet (0.25 mg total) by mouth at bedtime. 03/19/19  Yes Boscia, Heather E, NP  TRELEGY ELLIPTA 100-62.5-25 MCG/INH AEPB Inhale 1 puff into the lungs daily. 12/13/18  Yes Yevonne Pax, MD  Vitamin D, Ergocalciferol, (DRISDOL) 1.25 MG (50000 UT) CAPS capsule Take 1 capsule (50,000 Units total) by mouth once a week. 10/10/19  Yes Carlean Jews, NP  OXYGEN Inhale 2 L into the lungs.    [provider]   Physical Exam: Vitals:   12/15/19 1450 12/15/19 1453 12/15/19 2105 12/16/19 0519  BP:   123/60 127/69  Pulse:   (!) 106 (!) 103  Resp:   16 20  Temp:   98.7 F (37.1 C) 99.2 F (37.3 C)  TempSrc:   Oral Oral  SpO2: (!) 89% 92% 95% 93%  Weight:      Height:        Wt Readings from Last 3 Encounters:  2020/01/01 71.2 kg  12/04/19 71.4 kg  10/28/19 73.3 kg    General:  Appears calm and comfortable Eyes: PERRL, normal lids, irises & conjunctiva ENT: grossly normal hearing, lips & tongue Neck: no LAD, masses or thyromegaly Cardiovascular: RRR, no m/r/g. No LE edema. Respiratory: Coarse rhonchi are noted at this time       Normal respiratory effort. Abdomen: soft, nontender Skin: no rash or induration seen on limited exam Musculoskeletal: grossly normal tone BUE/BLE Psychiatric: grossly normal mood and affect Neurologic: grossly non-focal.          Labs on Admission:  Basic Metabolic Panel: Recent Labs  Lab January 01, 2020 2332 12/15/19 0511 12/16/19 0508  NA 142  --   --   K 4.0  --   --   CL 107  --   --   CO2 25  --   --   GLUCOSE 217*  --   --   BUN 27*  --   --   CREATININE 1.00 0.83 0.97  CALCIUM 8.5*  --   --    Liver Function Tests: Recent Labs  Lab 01-01-20 2332  AST 31  ALT 30  ALKPHOS 83  BILITOT 0.7  PROT 7.6  ALBUMIN 2.8*   No results for input(s): LIPASE, AMYLASE in  the last 168 hours. No results for input(s): AMMONIA in the last 168 hours. CBC: Recent Labs  Lab 01-Jan-2020 2332 12/14/19 0344  WBC 17.8* 17.6*  NEUTROABS 15.7*  --   HGB 13.4 13.1  HCT 42.8 41.9  MCV 91.3 90.9  PLT 211 196   Cardiac Enzymes: No results for input(s): CKTOTAL, CKMB, CKMBINDEX, TROPONINI in the last 168 hours.  BNP (last 3 results) No results for input(s): BNP in the last 8760 hours.  ProBNP (last 3 results) No results for input(s): PROBNP in the last 8760 hours.  CBG: Recent Labs  Lab 12/15/19 1124 12/15/19 1645 12/15/19 2102 12/16/19 0812 12/16/19 1138  GLUCAP 297* 442* 186* 163* 257*    Radiological Exams on Admission: No results found.  EKG: Independently reviewed.  Assessment/Plan Principal Problem:   Acute on chronic respiratory failure with hypoxia (HCC) Active Problems:   Sarcoidosis of lung (HCC)   Essential hypertension   Weakness   Healthcare-associated pneumonia   History of COVID-19   Acute respiratory failure (HCC)   Multifocal pneumonia   Type 2 diabetes mellitus without complication, without long-term current use of insulin (HCC)   1. Acute on chronic respiratory failure with hypoxia patient had significant decline in her status she has still some shortness of breath noted and still some hypoxia noted.  Recent diagnosis of COVID-19 pneumonia which was treated with remdesivir as well as Decadron.  In addition she has underlying baseline sarcoidosis representing her pre-existing condition.  She has been chronically steroid-dependent also.  Certainly this could represent pneumonia and she is already been started on ceftriaxone as well as azithromycin.  She notes that she has some improvement though not entirely back to her baseline.  I would continue with the antibiotics and oxygen therapy as you are. 2. Sarcoidosis patient has been on chronic steroid therapy she is right now on 40 mg IV daily and this should be continued and would be  converted over to prednisone when she is ready for discharge. Her baseline prednisone dose is 10mg   In addition she is on Breo which should be continued also. 3. Healthcare associated pneumonia currently is on Rocephin as well as azithromycin.  Clinically she is showing some signs of improvement we will continue to follow her off radiologically.  Code Status: Full code  Family Communication: No Disposition Plan: Home  Time spent: 70 minutes  I have personally obtained a history, examined the patient, evaluated laboratory and imaging results, formulated the assessment and plan and placed orders.  The Patient requires high complexity decision making for assessment and support. Total Time Spent   , MD [facility] Pulmonary Critical Care Medicine Sleep Medicine

## 2019-12-16 NOTE — Progress Notes (Signed)
Patient up to chair for most of afternoon.  Assisted patient to [facility] and then back to bed.  Patient noted to be very ShOB with activity which slowly resolved after getting back to bed.  Continue to monitor.

## 2019-12-16 NOTE — Evaluation (Signed)
Occupational Therapy Evaluation Patient Details Name: Marissa Luna MRN: [id number] DOB: 09-16-1956 Today's Date: 12/16/2019    History of Present Illness Pt admitted for acute on chronic respiratory failure with hypoxia secondary to Covid. She complains of cough, weakness, and SOB symptoms. History includes sarcodosis, COPD, CRD on 2L of O2 chronic, HTN and recent hospital stay for covid 12/26- 12/30   Clinical Impression   Pt seen for OT evaluation this date. Pt was independent in all ADLs and mobility prior to admission and recent Covid-19 diagnosis. Pt on 2 liters of O2 at home; currently on 7L via HFNC. Pt reports becoming easily fatigued or out of breath with minimize exertion. Pt currently requires supervision to PRN Min assist for LB ADL and ADL mobility due to poor activity tolerance and SOB. Pt educated in energy conservation conservation strategies including pursed lip breathing, activity pacing, home/routines modifications, work simplification, AE/DME, prioritizing of meaningful occupations, and falls prevention. Handout provided. Pt verbalized understanding but would benefit from additional skilled OT services to maximize recall and carryover of learned techniques and facilitate implementation of learned techniques into daily routines. Upon discharge, recommend [place] services.      Follow Up Recommendations  Home health OT    Equipment Recommendations  Tub/shower seat;Other (comment)(reacher, handheld shower head)    Recommendations for Other Services       Precautions / Restrictions Precautions Precautions: Fall Restrictions Weight Bearing Restrictions: No      Mobility Bed Mobility Overal bed mobility: Needs Assistance Bed Mobility: Supine to Sit     Supine to sit: Supervision     General bed mobility comments: deferred, in recliner  Transfers Overall transfer level: Needs assistance Equipment used: 1 person hand held assist Transfers: Sit to/from Stand Sit to  Stand: Min guard         General transfer comment: safe technique    Balance Overall balance assessment: Independent                                         ADL either performed or assessed with clinical judgement   ADL Overall ADL's : Needs assistance/impaired                                       General ADL Comments: PRN Min A for LB ADL, cues for rest breaks 2/2 SOB, supervision for ADL transfers     Vision Baseline Vision/History: No visual deficits Patient Visual Report: No change from baseline       Perception     Praxis      Pertinent Vitals/Pain Pain Assessment: No/denies pain     Hand Dominance     Extremity/Trunk Assessment Upper Extremity Assessment Upper Extremity Assessment: Generalized weakness   Lower Extremity Assessment Lower Extremity Assessment: Generalized weakness   Cervical / Trunk Assessment Cervical / Trunk Assessment: Normal   Communication Communication Communication: No difficulties   Cognition Arousal/Alertness: Awake/alert Behavior During Therapy: WFL for tasks assessed/performed Overall Cognitive Status: Within Functional Limits for tasks assessed                                     General Comments       Exercises Other Exercises Other Exercises: transfer to  BSC for BM. Able to perform self hygiene with supervision. Requests a bath today, RN notified. Other Exercises: Pt instructed in falls prevention and energy conservation strategies to support safe participation in ADL and IADL Tasks while minimizing risk of over exertion/SOB. Handout provided to support recall and carryover.   Shoulder Instructions      Home Living Family/patient expects to be discharged to:: Private residence Living Arrangements: Spouse/significant other;Children(currently living at daughters house right now) Available Help at Discharge: Family Type of Home: House Home Access: Stairs to  enter Secretary/administrator of Steps: 6 Entrance Stairs-Rails: None Home Layout: One level     Bathroom Shower/Tub: Chief Strategy Officer: Standard     Home Equipment: None          Prior Functioning/Environment Level of Independence: Independent        Comments: wears 2L of O2 at baseline, reports no falls and was indep prior to admission. Does report that she does fatigue quickly with exertion and has difficulty performing ADLs due to low energy and SOB        OT Problem List: Decreased knowledge of use of DME or AE;Decreased activity tolerance;Decreased strength      OT Treatment/Interventions: Self-care/ADL training;Therapeutic exercise;Therapeutic activities;Energy conservation;DME and/or AE instruction;Patient/family education    OT Goals(Current goals can be found in the care plan section) Acute Rehab OT Goals Patient Stated Goal: to get stronger and breathe better OT Goal Formulation: With patient Time For Goal Achievement: 12/30/19 Potential to Achieve Goals: Good ADL Goals Pt Will Perform Lower Body Dressing: with modified independence;sit to/from stand;with adaptive equipment(utilizing learned ECS to minimize SOB) Pt Will Transfer to Toilet: ambulating;with modified independence(utilizing learned ECS to minimize SOB) Pt Will Perform Tub/Shower Transfer: with modified independence;ambulating;shower seat Additional ADL Goal #1: Pt will verbalize plan to implement at least 3 learned energy conservation strategies to maximize safety/independence while minimizing SOB/over exertion.  OT Frequency: Min 1X/week   Barriers to D/C:            Co-evaluation              AM-PAC OT "6 Clicks" Daily Activity     Outcome Measure Help from another person eating meals?: None Help from another person taking care of personal grooming?: None Help from another person toileting, which includes using toliet, bedpan, or urinal?: A Little Help from another  person bathing (including washing, rinsing, drying)?: A Little Help from another person to put on and taking off regular upper body clothing?: None Help from another person to put on and taking off regular lower body clothing?: A Little 6 Click Score: 21   End of Session    Activity Tolerance: Patient tolerated treatment well Patient left: in chair;with call bell/phone within reach;with chair alarm set  OT Visit Diagnosis: Other abnormalities of gait and mobility (R26.89);Muscle weakness (generalized) (M62.81)                Time: 1600-1620 OT Time Calculation (min): 20 min Charges:  OT General Charges $OT Visit: 1 Visit OT Evaluation $OT Eval Low Complexity: 1 Low OT Treatments $Self Care/Home Management : 8-22 mins  Richrd Prime, MPH, MS, OTR/L ascom [phone] 12/16/19, 4:52 PM

## 2019-12-16 NOTE — Progress Notes (Signed)
Pt was on another call. Ch offered to call back. Will pass referral along to next on call Ch in the morning.   12/16/19 1644  Clinical Encounter Type  Visited With Patient  Visit Type Initial  Referral From Chaplain  Consult/Referral To Chaplain  Spiritual Encounters  Spiritual Needs Prayer;Emotional  Stress Factors  Patient Stress Factors Health changes  Family Stress Factors None identified

## 2019-12-17 LAB — GLUCOSE, CAPILLARY
Glucose-Capillary: 107 mg/dL — ABNORMAL HIGH (ref 70–99)
Glucose-Capillary: 93 mg/dL (ref 70–99)
Glucose-Capillary: 470 mg/dL — ABNORMAL HIGH (ref 70–99)
Glucose-Capillary: 227 mg/dL — ABNORMAL HIGH (ref 70–99)

## 2019-12-17 MED ORDER — INSULIN ASPART 100 UNIT/ML ~~LOC~~ SOLN
20.0000 [IU] | Freq: Once | SUBCUTANEOUS | Status: AC
  Administered 2019-12-17: 20 [IU] via SUBCUTANEOUS
  Filled 2019-12-17: qty 1

## 2019-12-17 MED ORDER — INSULIN ASPART 100 UNIT/ML ~~LOC~~ SOLN
3.0000 [IU] | Freq: Three times a day (TID) | SUBCUTANEOUS | Status: DC
  Administered 2019-12-18 – 2019-12-19 (×6): 3 [IU] via SUBCUTANEOUS
  Filled 2019-12-17 (×6): qty 1

## 2019-12-17 MED ORDER — INSULIN GLARGINE 100 UNIT/ML ~~LOC~~ SOLN
6.0000 [IU] | Freq: Every day | SUBCUTANEOUS | Status: DC
  Administered 2019-12-17 – 2019-12-20 (×4): 6 [IU] via SUBCUTANEOUS
  Filled 2019-12-17 (×5): qty 0.06

## 2019-12-17 MED ORDER — LIVING WELL WITH DIABETES BOOK
Freq: Once | Status: AC
  Filled 2019-12-17: qty 1

## 2019-12-17 NOTE — Progress Notes (Signed)
Inpatient Diabetes Program Recommendations  AACE/ADA: New Consensus Statement on Inpatient Glycemic Control (2015)  Target Ranges:  Prepandial:   less than 140 mg/dL      Peak postprandial:   less than 180 mg/dL (1-2 hours)      Critically ill patients:  140 - 180 mg/dL   Results for Marissa Luna, Marissa Luna (MRN [id number]) as of 12/17/2019 14:34  Ref. Range 12/17/2019 08:04 12/17/2019 12:14  Glucose-Capillary Latest Ref Range: 70 - 99 mg/dL 867 (H) 93   Results for Marissa Luna, Marissa Luna (MRN [id number]) as of 12/16/2019 09:56  Ref. Range 12/14/2019 03:44  Hemoglobin A1C Latest Ref Range: 4.8 - 5.6 % 8.0 (H)     Admit with: Acute on chronic hypoxic respiratory failure/ Bilateral lobar pneumonia  History: Sarcoidosis, COPD, COVID Infection (hospitalized from 11/30/19 thru 12/04/19)   Current Orders: Lantus 10 units QHS                            Novolog Sensitive Correction Scale/ SSI (0-9 units) TID AC + HS      Novolog 6 units TID with meals     Getting Solumedrol 40 mg Daily.    Note new diagnosis of Diabetes this admission--A1c elevated to 8%.  Ordered Transport planner for patient.  Called pt by phone this AM to discuss new diagnosis.  Ordered RD consult as well.  While getting Steroids recommend continuation of Insulin.  May be able to d/c home on oral medication regimen at time discharge: Could try low dose Sulfonylurea for home like Amaryl 2 mg Daily when ready for d/c.    Called pt around 11am this morning to discuss new diagnosis of diabetes, Current A1c, CBG levels, why we are giving her insulin, etc.  Pt told me the MD mentioned she was having issues with her glucose due to her illness and the steroids she is getting.  Discussed w/ pt that her A1c of 8% shows [place] that she has been having elevated CBGs for at least 3 months if not longer.  Explained to pt that she will likely need at least an oral diabetes medication for home and will need to check her CBGs at home to  make sure her CBGs are controlled.  Pt has follow appt with her PCP on 03/01.  Encouraged pt to discuss her A1c and need for insulin in the hospital with her PCP.  Told pt I wuld call her again tomorrow (01/13) to see if she has any additional questions.      --Will follow patient during hospitalization--  Ambrose Finland RN, MSN, CDE Diabetes Coordinator Inpatient Glycemic Control Team Team Pager: [phone] (8a-5p)

## 2019-12-17 NOTE — Progress Notes (Signed)
Nutrition Follow Up Note   DOCUMENTATION CODES:   Obesity unspecified  INTERVENTION:   - Ensure Enlive po BID, each supplement provides 350 kcal and 20 grams of protein  - MVI with minerals daily  NUTRITION DIAGNOSIS:   Increased nutrient needs related to acute illness, catabolic illness (COVID-19) as evidenced by estimated needs.  GOAL:   Patient will meet greater than or equal to 90% of their needs -progressing   MONITOR:   PO intake, Supplement acceptance, Labs, Weight trends  ASSESSMENT:   64 year old female who presented to the ED on 1/08 with increased cough, weakness, SOB. Pt diagnosed with COVID-19 on 11/30/19 and was hospitalized from 12/26-12/30/20 with pneumonia and respiratory failure secondary to COVID-19. PMH of COPD with chronic respiratory failure on home oxygen, HTN, sarcoidosis.   Spoke with pt via phone. Pt reports continued good appetite and oral intake; pt reports that she is eating 100% of her meals and drinking her Ensure. No new weight since 1/8; will request weekly weights.   Medications reviewed and include: vitamin C, Oscal with D, lovenox, folic acid, Lasix, insulin, solu-medrol, MVI, ceftriaxone   Labs reviewed: cbgs- 257, 380, 164, 107, 93 x 24 hrs  Diet Order:   Diet Order            Diet Carb Modified Fluid consistency: Thin; Room service appropriate? Yes  Diet effective now             EDUCATION NEEDS:   Education needs have been addressed  Skin:  Skin Assessment: Reviewed RN Assessment  Last BM:  1/11  Height:   Ht Readings from Last 1 Encounters:  12/26/2019 4\' 11"  (1.499 m)    Weight:   Wt Readings from Last 1 Encounters:  12/26/2019 71.2 kg    Ideal Body Weight:  44.7 kg  BMI:  Body mass index is 31.71 kg/m.  Estimated Nutritional Needs:   Kcal:  1700-1900  Protein:  85-100 grams  Fluid:  >/= 1.7 L  02/10/20 MS, RD, LDN Pager #- [phone] Office#- [phone] After Hours Pager: [phone]

## 2019-12-17 NOTE — Progress Notes (Signed)
Patient ID: Marissa Luna, female   DOB: 01-Nov-1956, 64 y.o.   MRN: [id number] [facility] PROGRESS NOTE  Marissa Luna TKP:[id number] DOB: 04/16/1956 DOA: 12/29/2019 PCP: Ronnell Freshwater, NP  HPI/Subjective: Patient feeling better than when she came in.  Still with some cough and shortness of breath.  Urinating a lot during the day but not as much at night.  Objective: Vitals:   12/17/19 0932 12/17/19 1216  BP: 121/69 128/74  Pulse:  (!) 103  Resp:  16  Temp:  98 F (36.7 C)  SpO2:  (!) 88%    Filed Weights   12/26/2019 2252  Weight: 71.2 kg    ROS: Review of Systems  Constitutional: Negative for chills and fever.  Eyes: Negative for blurred vision.  Respiratory: Positive for cough and shortness of breath.   Cardiovascular: Negative for chest pain.  Gastrointestinal: Negative for abdominal pain, constipation, diarrhea, nausea and vomiting.  Genitourinary: Positive for frequency. Negative for dysuria.  Musculoskeletal: Negative for joint pain.  Neurological: Negative for dizziness and headaches.   Exam: Physical Exam  Constitutional: She is oriented to person, place, and time.  HENT:  Nose: No mucosal edema.  Mouth/Throat: No oropharyngeal exudate or posterior oropharyngeal edema.  Eyes: Conjunctivae and lids are normal.  Neck: No JVD present. Carotid bruit is not present. No thyroid mass and no thyromegaly present.  Cardiovascular: S1 normal and S2 normal. Exam reveals no gallop.  No murmur heard. Respiratory: No respiratory distress. She has decreased breath sounds in the right lower field and the left lower field. She has no wheezes. She has no rhonchi. She has no rales.  GI: Soft. Bowel sounds are normal. There is no abdominal tenderness.  Musculoskeletal:     Cervical back: No edema.     Right ankle: Swelling present.     Left ankle: Swelling present.  Lymphadenopathy:    She has no cervical adenopathy.  Neurological: She is alert and oriented to person,  place, and time. No cranial nerve deficit.  Skin: Skin is warm. No rash noted. Nails show no clubbing.  Psychiatric: She has a normal mood and affect.      Data Reviewed: Basic Metabolic Panel: Recent Labs  Lab 12/16/2019 2332 12/15/19 0511 12/16/19 0508  NA 142  --   --   K 4.0  --   --   CL 107  --   --   CO2 25  --   --   GLUCOSE 217*  --   --   BUN 27*  --   --   CREATININE 1.00 0.83 0.97  CALCIUM 8.5*  --   --    Liver Function Tests: Recent Labs  Lab 12/31/2019 2332  AST 31  ALT 30  ALKPHOS 83  BILITOT 0.7  PROT 7.6  ALBUMIN 2.8*   CBC: Recent Labs  Lab 12/29/2019 2332 12/14/19 0344  WBC 17.8* 17.6*  NEUTROABS 15.7*  --   HGB 13.4 13.1  HCT 42.8 41.9  MCV 91.3 90.9  PLT 211 196     Recent Results (from the past 240 hour(s))  Blood Culture (routine x 2)     Status: None (Preliminary result)   Collection Time: 12/29/2019 11:32 PM   Specimen: Right Antecubital; Blood  Result Value Ref Range Status   Specimen Description RIGHT ANTECUBITAL  Final   Special Requests Blood Culture adequate volume  Final   Culture   Final    NO GROWTH 4 DAYS Performed at [facility], [address] [place], [place] [address]    Report Status PENDING  Incomplete  Blood Culture (routine x 2)     Status: None (Preliminary result)   Collection Time: Dec 18, 2019 11:33 PM   Specimen: Left Antecubital; Blood  Result Value Ref Range Status   Specimen Description LEFT ANTECUBITAL  Final   Special Requests   Final    Blood Culture results may not be optimal due to an inadequate volume of blood received in culture bottles   Culture   Final    NO GROWTH 4 DAYS Performed at [facility], [address] [place], [place] [address]    Report Status PENDING  Incomplete  Respiratory Panel by PCR     Status: None   Collection Time: 12/14/19  9:05 AM   Specimen: Nasopharyngeal Swab; Respiratory  Result Value Ref Range Status   Adenovirus NOT DETECTED NOT DETECTED  Final   Coronavirus 229E NOT DETECTED NOT DETECTED Final    Comment: (NOTE) The Coronavirus on the Respiratory Panel, DOES NOT test for the novel  Coronavirus (2019 nCoV)    Coronavirus HKU1 NOT DETECTED NOT DETECTED Final   Coronavirus NL63 NOT DETECTED NOT DETECTED Final   Coronavirus OC43 NOT DETECTED NOT DETECTED Final   Metapneumovirus NOT DETECTED NOT DETECTED Final   Rhinovirus / Enterovirus NOT DETECTED NOT DETECTED Final   Influenza A NOT DETECTED NOT DETECTED Final   Influenza B NOT DETECTED NOT DETECTED Final   Parainfluenza Virus 1 NOT DETECTED NOT DETECTED Final   Parainfluenza Virus 2 NOT DETECTED NOT DETECTED Final   Parainfluenza Virus 3 NOT DETECTED NOT DETECTED Final   Parainfluenza Virus 4 NOT DETECTED NOT DETECTED Final   Respiratory Syncytial Virus NOT DETECTED NOT DETECTED Final   Bordetella pertussis NOT DETECTED NOT DETECTED Final   Chlamydophila pneumoniae NOT DETECTED NOT DETECTED Final   Mycoplasma pneumoniae NOT DETECTED NOT DETECTED Final    Comment: Performed at [facility], [address], [place], [place] [address]  MRSA PCR Screening     Status: None   Collection Time: 12/15/19  2:39 PM   Specimen: Nasal Mucosa; Nasopharyngeal  Result Value Ref Range Status   MRSA by PCR NEGATIVE NEGATIVE Final    Comment:        The GeneXpert MRSA Assay (FDA approved for NASAL specimens only), is one component of a comprehensive MRSA colonization surveillance program. It is not intended to diagnose MRSA infection nor to guide or monitor treatment for MRSA infections. Performed at [facility], [address] [place], [place] [address]   Sputum culture     Status: None   Collection Time: 12/16/19  2:30 AM   Specimen: Sputum  Result Value Ref Range Status   Specimen Description SPUTUM  Final   Special Requests NONE  Final   Sputum evaluation   Final    THIS SPECIMEN IS ACCEPTABLE FOR SPUTUM CULTURE Performed at [facility],  [address] [place], [place] [address]    Report Status 12/16/2019 FINAL  Final  Culture, respiratory     Status: None (Preliminary result)   Collection Time: 12/16/19  2:30 AM   Specimen: SPU  Result Value Ref Range Status   Specimen Description   Final    SPUTUM Performed at [facility] [address] [place], [place] [address]    Special Requests   Final    NONE Reflexed from [phone] Performed at [facility], [address] [place], [place] [address]    Gram Stain   Final    RARE WBC PRESENT, PREDOMINANTLY PMN FEW GRAM POSITIVE COCCI IN PAIRS FEW GRAM NEGATIVE RODS    Culture   Final    NO GROWTH < 12 HOURS Performed at [facility], [address] [place], [place] [address]    Report Status PENDING  Incomplete     Scheduled Meds: . amLODipine  10 mg Oral Daily  . ascorbic acid  500 mg Oral Daily  . calcium-vitamin D  1 tablet Oral Daily  . enoxaparin (LOVENOX) injection  40 mg Subcutaneous Q24H  . feeding supplement (ENSURE ENLIVE)  237 mL Oral BID BM  . fluticasone  2 spray Each Nare Daily  . fluticasone furoate-vilanterol  1 puff Inhalation Daily   And  . umeclidinium bromide  1 puff Inhalation Daily  . folic acid  1 mg Oral Daily  . furosemide  20 mg Oral Daily  . insulin aspart  0-5 Units Subcutaneous QHS  . insulin aspart  0-9 Units Subcutaneous TID WC  . insulin aspart  6 Units Subcutaneous TID WC  . insulin glargine  10 Units Subcutaneous QHS  . Ipratropium-Albuterol  1 puff Inhalation Q6H  . loratadine  10 mg Oral Daily  . methylPREDNISolone (SOLU-MEDROL) injection  40 mg Intravenous Daily  . multivitamin with minerals  1 tablet Oral Daily  . rOPINIRole  0.25 mg Oral QHS   Continuous Infusions: . sodium chloride Stopped (12/17/19 0114)  . cefTRIAXone (ROCEPHIN)  IV Stopped (12/17/19 0959)    Assessment/Plan:  1. Acute on chronic hypoxic respiratory failure.  Patient on high flow nasal cannula 7 L this morning when  I saw her.  I dialed it down to 5 L.  The patient will continue to be in the hospital on high flow nasal cannula.  Patient moving better air.  Appreciate pulmonary consultation. 2. Bilateral lobar pneumonia.  Recent Covid infection.  On Rocephin.  Completed high-dose Zithromax treatment. 3. Sarcoidosis.  Continue Solu-Medrol daily dosing.  Continue inhalers.  Likely will need a little longer steroid taper. 4. 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  Hemoglobin A1c elevated at 8.0.  Low carbohydrate diet discussed.  Decreased dose of Lantus insulin down to 6 units and decreased prior to meal short acting insulin.  Hopefully can do oral medications upon discharge once steroids are tapered. 5. Essential hypertension on Norvasc 6. Restless leg syndrome on Requip 7. Weakness.  Physical therapy evaluation appreciated.  Home health PT recommended.   Code Status:     Code Status Orders  (From admission, onward)         Start     Ordered   12/14/19 0228  Full code  Continuous     12/14/19 0232        Code Status History    Date Active Date Inactive Code Status Order ID Comments User Context   11/30/2019 1316 12/04/2019 1836 Full Code [id number]  Arnetha Courser, MD ED   01/12/2018 0118 01/15/2018 2021 Full Code [id number]  Oralia Manis, MD Inpatient   Advance Care Planning Activity     Family Communication: Spoke with daughter on the phone Disposition Plan: Needs to be off high flow nasal cannula prior to any disposition  Antibiotics:  Rocephin  Zithromax completed  Time spent: 27 minutes  Marissa Luna [employer]

## 2019-12-17 NOTE — TOC Initial Note (Signed)
Transition of Care [facility]) - Initial/Assessment Note    Patient Details  Name: Marissa Luna MRN: [id number] Date of Birth: 11/06/1956  Transition of [facility] CM/SW Contact:    Chapman Fitch, RN Phone Number: 12/17/2019, 3:33 PM  Clinical Narrative:                 Patient admitted from home with acute on chronic respiratory failure  Patient lives at home with daughter  [facility]  Denies issues with transportation or with obtaining medications   Patient states that she has home O2 through [facility]. States that normally she wears 2L at home.  Currently she is requiring increased liter flow.   PT has assessed patient and recommends home health PT, and rolling walker  Patient now has [employer] active on 12/05/18. RNCM updated Epic with her insurance information  [facility] is in network with [facility].  Patient in agreement to services at home.    If patient discharges home on insulin patient will need prescription of  glucometer and testing supplies.   Expected Discharge Plan: Home w Home Health Services     Patient Goals and CMS Choice     Choice offered to / list presented to : Patient  Expected Discharge Plan and Services Expected Discharge Plan: Home w Home Health Services   Discharge Planning Services: CM Consult Post Acute Care Choice: Home Health, Durable Medical Equipment Living arrangements for the past 2 months: Single Family Home                 DME Arranged: [place] rolling DME Agency: [facility]       HH Arranged: RN, PT HH Agency: [facility] Date HH Agency Contacted: 12/17/19   Representative spoke with at [facility] Agency: Barbara Cower  Prior Living Arrangements/Services Living arrangements for the past 2 months: Single Family Home Lives with:: Adult Children Patient language and need for interpreter reviewed:: Yes Do you feel safe going back to the place where you live?: Yes       Need for Family Participation in Patient Care: Yes (Comment) Care giver support system in place?: Yes (comment) Current home services: DME    Activities of Daily Living Home Assistive Devices/Equipment: Dan Humphreys (specify type) ADL Screening (condition at time of admission) Patient's cognitive ability adequate to safely complete daily activities?: Yes Is the patient deaf or have difficulty hearing?: No Does the patient have difficulty seeing, even when wearing glasses/contacts?: No Does the patient have difficulty concentrating, remembering, or making decisions?: No Patient able to express need for assistance with ADLs?: Yes Does the patient have difficulty dressing or bathing?: Yes Independently performs ADLs?: No Communication: Independent Dressing (OT): Needs assistance Is this a change from baseline?: Pre-admission baseline Grooming: Needs assistance Is this a change from baseline?: Pre-admission baseline Feeding: Independent Bathing: Needs assistance Is this a change from baseline?: Pre-admission baseline Toileting: Needs assistance Is this a change from baseline?: Pre-admission baseline In/Out Bed: Needs assistance Is this a change from baseline?: Pre-admission baseline Walks in Home: Needs assistance Is this a change from baseline?: Pre-admission baseline Does the patient have difficulty walking or climbing stairs?: Yes Weakness of Legs: Both Weakness of Arms/Hands: None  Permission Sought/Granted                  Emotional Assessment       Orientation: : Oriented to Self, Oriented to Place, Oriented to  Time, Oriented to Situation  Psych Involvement: No (comment)  Admission diagnosis:  Acute respiratory failure (HCC) [J96.00] Acute respiratory failure with hypoxia ([place]) [J96.01] Multifocal pneumonia [J18.9] Patient Active Problem List   Diagnosis Date Noted  . Type 2 diabetes mellitus without complication, without long-term current use of insulin ([place])    . Acute on chronic respiratory failure with hypoxia ([place]) 12/14/2019  . Healthcare-associated pneumonia 12/14/2019  . History of COVID-19 12/14/2019  . Acute respiratory failure ([place]) 12/14/2019  . Multifocal pneumonia   . Frequent urination   . Elevated glucose   . COVID-19 11/30/2019  . Unsatisfactory cervical Papanicolaou smear 10/28/2019  . Pain in both lower extremities 06/30/2019  . Restless leg syndrome 12/20/2018  . Oxygen dependent 10/17/2018  . Weakness 10/17/2018  . Abnormal weight gain 10/17/2018  . Sarcoidosis   . Need for prophylactic vaccination against Streptococcus pneumoniae (pneumococcus) 07/11/2018  . Routine cervical smear 03/25/2018  . Dysuria 03/25/2018  . Chronic respiratory failure with hypoxia ([place]) 03/13/2018  . Acute bronchitis with COPD ([place]) 02/22/2018  . Lymphocytosis 02/22/2018  . Abnormal kidney function 02/22/2018  . Sepsis ([place]) 01/11/2018  . Community acquired pneumonia of left lung 01/11/2018  . Influenza A 01/11/2018  . Sarcoidosis of lung ([place]) 01/08/2018  . Hypoxia 01/08/2018  . Allergic rhinitis, unspecified 01/08/2018  . Essential hypertension 01/08/2018  . 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 SOB (shortness of breath) 01/08/2018   PCP:  Ronnell Freshwater, NP Pharmacy[facility]   [id number] [place] [place] - [address] [place] [address] Phone: [phone] Fax: [phone]     Social Determinants of Health (SDOH) Interventions    Readmission Risk Interventions No flowsheet data found.

## 2019-12-17 NOTE — Progress Notes (Signed)
I notified Dr. Renae Gloss patient O2 dropped this morning when she got up to [facility] and chair so I put her back at 7L. When the patient got up from the chair to [facility] [place] her O2 dropped to 80% on the 7L. I put her on 8L and her oxygen is staying in the 89-90%.   Notified Dr. Renae Gloss about her glucose being 470 and he ordered to give 20 units as a one time dose.

## 2019-12-18 DIAGNOSIS — Z8616 Personal history of COVID-19: Secondary | ICD-10-CM

## 2019-12-18 LAB — CULTURE, BLOOD (ROUTINE X 2)
Culture: NO GROWTH
Culture: NO GROWTH
Special Requests: ADEQUATE

## 2019-12-18 LAB — GLUCOSE, CAPILLARY
Glucose-Capillary: 118 mg/dL — ABNORMAL HIGH (ref 70–99)
Glucose-Capillary: 369 mg/dL — ABNORMAL HIGH (ref 70–99)
Glucose-Capillary: 293 mg/dL — ABNORMAL HIGH (ref 70–99)
Glucose-Capillary: 131 mg/dL — ABNORMAL HIGH (ref 70–99)

## 2019-12-18 NOTE — Progress Notes (Signed)
Oxygen saturation on 8 liters HFNC was 84%. Increased the rate to 13 liters HFNC and oxygen saturation increased to 88-90%. Esaw Grandchild, DO aware.  Leonides Cave, RN  12/18/2019 12:45 PM

## 2019-12-18 NOTE — Progress Notes (Signed)
Inpatient Diabetes Program Recommendations  AACE/ADA: New Consensus Statement on Inpatient Glycemic Control (2015)  Target Ranges:  Prepandial:   less than 140 mg/dL      Peak postprandial:   less than 180 mg/dL (1-2 hours)      Critically ill patients:  140 - 180 mg/dL   Results for HEDAYA, LATENDRESSE (MRN [id number]) as of 12/18/2019 13:36  Ref. Range 12/17/2019 08:04 12/17/2019 12:14 12/17/2019 16:33 12/17/2019 21:01  Glucose-Capillary Latest Ref Range: 70 - 99 mg/dL 409 (H)  6 units NOVOLOG  93  6 units NOVOLOG  470 (H)  20 units NOVOLOG  227 (H)  2 units NOVOLOG +  6 units LANTUS   Results for KHRYSTINA, BONNES (MRN [id number]) as of 12/18/2019 13:36  Ref. Range 12/18/2019 07:58 12/18/2019 11:38  Glucose-Capillary Latest Ref Range: 70 - 99 mg/dL 956 (H)  3 units NOVOLOG  369 (H)  12 units NOVOLOG     Admit with:Acute on chronic hypoxic respiratory failure/Bilateral lobar pneumonia  History:Sarcoidosis, COPD, COVID Infection (hospitalized from 11/30/19 thru 12/04/19)   Current Orders:Lantus 6 units QHS Novolog Sensitive Correction Scale/ SSI (0-9 units) TID AC + HS                            Novolog 3 units TID with meals     Getting Solumedrol 40 mg Daily.    Note new diagnosis of Diabetes this admission--A1c elevated to 8%. Ordered Transport planner for patient.  Called pt by phone yesterday AM to discuss new diagnosis.  Ordered RD consult as well.    MD- While getting Steroids recommend continuation of Insulin.  Note that Lantus reduced to 6 units QHS last PM--Agree with reduction of Lantus as AM CBGs have been <120 mg/dl.  May need to increase the Novolog Meal Coverage while patient remains on Solumedrol:  Recommend increase back to 6 units TID with meals  May be able to d/c home on oral medication regimen at time discharge: Could try low dose Sulfonylurea for home like Amaryl 2 mg Daily when ready for  d/c.    --Will follow patient during hospitalization--  Ambrose Finland RN, MSN, CDE Diabetes Coordinator Inpatient Glycemic Control Team Team Pager: [phone] (8a-5p)

## 2019-12-18 NOTE — Progress Notes (Addendum)
At 15:55, patient's oxygen saturation on 13 liters nasal cannula was 84% and patient said she was "hungry for air". Increased flow to 15 liters and oxygen saturation increased to 88%. RRT was contacted and put the patient on heated high flow nasal cannula at 40 liters/min and 57% FiO2.Patient's oxygen saturation has been in the low 90's since.  Leonides Cave, RN 12/18/2019 6:30 PM

## 2019-12-18 NOTE — Progress Notes (Signed)
Nutrition Brief Note   RD received consult for Diabetes diet education. Spoke with patient via phone. Pt currently with COVID 19. Pt reports that she is not feeling well today and she would like to discuss diabetes diet at a later time. RD will attempt to provide diet education prior to discharge at a time when patient is feeling better. Will mail handouts with supporting information to patient's home. RD is already following this patient in hospital.   Betsey Holiday MS, RD, LDN Pager #- [phone] Office#- [phone] After Hours Pager: [phone]

## 2019-12-18 NOTE — Progress Notes (Signed)
PROGRESS NOTE    Marissa Luna  SAY:[id number] DOB: August 02, 1956 DOA: 12-20-2019  PCP: Carlean Jews, NP    LOS - 4   Brief Narrative:  64 y.o. female with medical history of sarcoidosis and COPD with chronic respiratory failure on 2L/min home O2, hypertension and recently hospitalized from 11/30/19 - 12/04/19 with COVID-19 pneumonia and acute on chronic respiratory failure treated with 5 days of remdesivir and Decadron.  She presented to the ED on 1/8 with a complaints of increased cough, weakness and shortness of breath.  In the ED, afebrile, mildly tachycardic and tachypneic, hypoxic in 80's on room air.   Labs notable for leukocytosis 17k, elevated inflammatory markers including D-dimer of 1572, ferritin 645, CRP 41.6.  CT chest showed hazy groundglass opacities seen within both lungs consistent with COVID-19 pneumonia, and interval progression of sarcoidosis compared to 2011.  She was started on IV antibiotics, dexamethasone and hospitalist consulted for admission.   Subjective 1/13: Patient seen this AM.  States her breathing feels "stable", no better but no worse.  Intermittent cough productive of brown-ish sputum.  No fever/chills.  No acute events reported.  Requiring 8 L/min HFNC oxygen as of this AM.  Assessment & Plan:   Principal Problem:   Acute on chronic respiratory failure with hypoxia (HCC) Active Problems:   Sarcoidosis of lung (HCC)   Essential hypertension   Weakness   Healthcare-associated pneumonia   History of COVID-19   Acute respiratory failure (HCC)   Multifocal pneumonia   Type 2 diabetes mellitus without complication, without long-term current use of insulin (HCC)   Acute on chronic respiratory failure with hypoxia Patient reports to using 2 L/min oxygen only at night, at baseline.  Requiring 8 L/min high flow nasal cannula this morning.  Patient also had recent COVID-19 infection, admitted 12/26 through 12/30.  Completed treatment with 5 days remdesivir  and 10 days Decadron. --Pulmonology following given underlying progressive sarcoidosis  --Continue Solu-Medrol per pulmonary --Expect she will need a longer steroid taper  Bilateral lobar pneumonia In setting of recent COVID-19 pneumonia.  Completed course of Zithromax. --Continue Rocephin  Sarcoidosis Noted to have progressed since 2011 on CT scan obtained on admission --Continue steroids as above --Follow with pulmonology  Type 2 diabetes A1c 8.0%.  Not on medications for diabetes as outpatient. --Continue Lantus 6 units at bedtime, NovoLog 3 units with meals, sliding scale NovoLog --Should be discharged on an oral medication and follow-up with PCP closely --Carb modified diet and patient counseled regarding this.  Essential hypertension -chronic, stable -Continue Norvasc  Restless leg syndrome -chronic, stable -Continue Requip  Generalized weakness -secondary to recent acute illnesses and underlying comorbidities. --PT eval --Home health PT recommended, TOC consulted to arrange   DVT prophylaxis: Lovenox   Code Status: Full Code  Family Communication: None at bedside Disposition Plan: Pending further clinical improvement.  Expect discharge home with home health PT.  Patient currently continues to require hospital care given significant oxygen requirement and still needing high flow nasal cannula to treat her hypoxia.  Consultants:   Pulmonology  Procedures:   None  Antimicrobials:   Rocephin   Zithromax completed   Objective: Vitals:   12/17/19 2019 12/18/19 0646 12/18/19 0859 12/18/19 1141  BP:  130/64 117/68 126/61  Pulse: (!) 101 98 (!) 106 (!) 116  Resp:  20  18  Temp:  98 F (36.7 C)  97.8 F (36.6 C)  TempSrc:  Oral  Oral  SpO2: 93% 94% 91% 93%  Weight:      Height:        Intake/Output Summary (Last 24 hours) at 12/18/2019 1320 Last data filed at 12/18/2019 0648 Gross per 24 hour  Intake 180 ml  Output 800 ml  Net -620 ml   Filed  Weights   12/24/2019 2252  Weight: 71.2 kg    Examination:  General exam: awake, alert, no acute distress HEENT: moist mucus membranes, hearing grossly normal  Respiratory system: bibasilar rales without rhonchi, normal respiratory effort.  On 8L/min HFNC. Cardiovascular system: normal S1/S2, RRR, no JVD, murmurs, rubs, gallops, no pedal edema.   Central nervous system: alert and oriented x4. no gross focal neurologic deficits, normal speech Extremities: moves all, no cyanosis, normal tone Skin: dry, intact, normal temperature Psychiatry: normal mood, congruent affect, judgement and insight appear normal    Data Reviewed: I have personally reviewed following labs and imaging studies  CBC: Recent Labs  Lab 12/29/2019 2332 12/14/19 0344  WBC 17.8* 17.6*  NEUTROABS 15.7*  --   HGB 13.4 13.1  HCT 42.8 41.9  MCV 91.3 90.9  PLT 211 379   Basic Metabolic Panel: Recent Labs  Lab 12/20/2019 2332 12/15/19 0511 12/16/19 0508  NA 142  --   --   K 4.0  --   --   CL 107  --   --   CO2 25  --   --   GLUCOSE 217*  --   --   BUN 27*  --   --   CREATININE 1.00 0.83 0.97  CALCIUM 8.5*  --   --    GFR: Estimated Creatinine Clearance: 50.3 mL/min (by C-G formula based on SCr of 0.97 mg/dL). Liver Function Tests: Recent Labs  Lab 12/22/2019 2332  AST 31  ALT 30  ALKPHOS 83  BILITOT 0.7  PROT 7.6  ALBUMIN 2.8*   No results for input(s): LIPASE, AMYLASE in the last 168 hours. No results for input(s): AMMONIA in the last 168 hours. Coagulation Profile: No results for input(s): INR, PROTIME in the last 168 hours. Cardiac Enzymes: No results for input(s): CKTOTAL, CKMB, CKMBINDEX, TROPONINI in the last 168 hours. BNP (last 3 results) No results for input(s): PROBNP in the last 8760 hours. HbA1C: No results for input(s): HGBA1C in the last 72 hours. CBG: Recent Labs  Lab 12/17/19 1214 12/17/19 1633 12/17/19 2101 12/18/19 0758 12/18/19 1138  GLUCAP 93 470* 227* 118* 369*    Lipid Profile: No results for input(s): CHOL, HDL, LDLCALC, TRIG, CHOLHDL, LDLDIRECT in the last 72 hours. Thyroid Function Tests: No results for input(s): TSH, T4TOTAL, FREET4, T3FREE, THYROIDAB in the last 72 hours. Anemia Panel: No results for input(s): VITAMINB12, FOLATE, FERRITIN, TIBC, IRON, RETICCTPCT in the last 72 hours. Sepsis Labs: Recent Labs  Lab 12/18/2019 2332  PROCALCITON 0.25  LATICACIDVEN 1.4    Recent Results (from the past 240 hour(s))  Blood Culture (routine x 2)     Status: None   Collection Time: 12/26/2019 11:32 PM   Specimen: Right Antecubital; Blood  Result Value Ref Range Status   Specimen Description RIGHT ANTECUBITAL  Final   Special Requests Blood Culture adequate volume  Final   Culture   Final    NO GROWTH 5 DAYS Performed at [facility], [address] [place], [place] [address]    Report Status 12/18/2019 FINAL  Final  Blood Culture (routine x 2)     Status: None   Collection Time: 12/20/2019 11:33 PM   Specimen: Left  Antecubital; Blood  Result Value Ref Range Status   Specimen Description LEFT ANTECUBITAL  Final   Special Requests   Final    Blood Culture results may not be optimal due to an inadequate volume of blood received in culture bottles   Culture   Final    NO GROWTH 5 DAYS Performed at [facility], [address] [place], [place] [address]    Report Status 12/18/2019 FINAL  Final  Respiratory Panel by PCR     Status: None   Collection Time: 12/14/19  9:05 AM   Specimen: Nasopharyngeal Swab; Respiratory  Result Value Ref Range Status   Adenovirus NOT DETECTED NOT DETECTED Final   Coronavirus 229E NOT DETECTED NOT DETECTED Final    Comment: (NOTE) The Coronavirus on the Respiratory Panel, DOES NOT test for the novel  Coronavirus (2019 nCoV)    Coronavirus HKU1 NOT DETECTED NOT DETECTED Final   Coronavirus NL63 NOT DETECTED NOT DETECTED Final   Coronavirus OC43 NOT DETECTED NOT DETECTED Final    Metapneumovirus NOT DETECTED NOT DETECTED Final   Rhinovirus / Enterovirus NOT DETECTED NOT DETECTED Final   Influenza A NOT DETECTED NOT DETECTED Final   Influenza B NOT DETECTED NOT DETECTED Final   Parainfluenza Virus 1 NOT DETECTED NOT DETECTED Final   Parainfluenza Virus 2 NOT DETECTED NOT DETECTED Final   Parainfluenza Virus 3 NOT DETECTED NOT DETECTED Final   Parainfluenza Virus 4 NOT DETECTED NOT DETECTED Final   Respiratory Syncytial Virus NOT DETECTED NOT DETECTED Final   Bordetella pertussis NOT DETECTED NOT DETECTED Final   Chlamydophila pneumoniae NOT DETECTED NOT DETECTED Final   Mycoplasma pneumoniae NOT DETECTED NOT DETECTED Final    Comment: Performed at [facility], [address], [place], [place] [address]  MRSA PCR Screening     Status: None   Collection Time: 12/15/19  2:39 PM   Specimen: Nasal Mucosa; Nasopharyngeal  Result Value Ref Range Status   MRSA by PCR NEGATIVE NEGATIVE Final    Comment:        The GeneXpert MRSA Assay (FDA approved for NASAL specimens only), is one component of a comprehensive MRSA colonization surveillance program. It is not intended to diagnose MRSA infection nor to guide or monitor treatment for MRSA infections. Performed at [facility], [address] [place], [place] [address]   Sputum culture     Status: None   Collection Time: 12/16/19  2:30 AM   Specimen: Sputum  Result Value Ref Range Status   Specimen Description SPUTUM  Final   Special Requests NONE  Final   Sputum evaluation   Final    THIS SPECIMEN IS ACCEPTABLE FOR SPUTUM CULTURE Performed at [facility], [address] [place], [place] [address]    Report Status 12/16/2019 FINAL  Final  Culture, respiratory     Status: None (Preliminary result)   Collection Time: 12/16/19  2:30 AM   Specimen: SPU  Result Value Ref Range Status   Specimen Description   Final    SPUTUM Performed at [facility], [address]  [place], [place] [address]    Special Requests   Final    NONE Reflexed from [id number] Performed at [facility], [address] [place], [place] [address]    Gram Stain   Final    RARE WBC PRESENT, PREDOMINANTLY PMN FEW GRAM POSITIVE COCCI IN PAIRS FEW GRAM NEGATIVE RODS    Culture   Final  CULTURE REINCUBATED FOR BETTER GROWTH Performed at [facility], [address] [place], [place] [address]    Report Status PENDING  Incomplete         Radiology Studies: No results found.      Scheduled Meds: . amLODipine  10 mg Oral Daily  . ascorbic acid  500 mg Oral Daily  . calcium-vitamin D  1 tablet Oral Daily  . enoxaparin (LOVENOX) injection  40 mg Subcutaneous Q24H  . feeding supplement (ENSURE ENLIVE)  237 mL Oral BID BM  . fluticasone  2 spray Each Nare Daily  . fluticasone furoate-vilanterol  1 puff Inhalation Daily   And  . umeclidinium bromide  1 puff Inhalation Daily  . folic acid  1 mg Oral Daily  . furosemide  20 mg Oral Daily  . insulin aspart  0-5 Units Subcutaneous QHS  . insulin aspart  0-9 Units Subcutaneous TID WC  . insulin aspart  3 Units Subcutaneous TID WC  . insulin glargine  6 Units Subcutaneous QHS  . Ipratropium-Albuterol  1 puff Inhalation Q6H  . loratadine  10 mg Oral Daily  . methylPREDNISolone (SOLU-MEDROL) injection  40 mg Intravenous Daily  . multivitamin with minerals  1 tablet Oral Daily  . rOPINIRole  0.25 mg Oral QHS   Continuous Infusions: . sodium chloride 20 mL (12/18/19 1006)     LOS: 4 days    Time spent: 30-35 minutes    Pennie Banter, DO [facility]   If 7PM-7AM, please contact night-coverage [email or web address] Password [email or web address] 12/18/2019, 1:20 PM

## 2019-12-19 ENCOUNTER — Inpatient Hospital Stay (HOSPITAL_COMMUNITY): Admission: AD | Admit: 2019-12-19 | Payer: 59 | Source: Other Acute Inpatient Hospital | Admitting: Internal Medicine

## 2019-12-19 LAB — CBC WITH DIFFERENTIAL/PLATELET
Abs Immature Granulocytes: 0.16 10*3/uL — ABNORMAL HIGH (ref 0.00–0.07)
Basophils Absolute: 0.1 10*3/uL (ref 0.0–0.1)
Basophils Relative: 0 %
Eosinophils Absolute: 0 10*3/uL (ref 0.0–0.5)
Eosinophils Relative: 0 %
HCT: 40.1 % (ref 36.0–46.0)
Hemoglobin: 12.7 g/dL (ref 12.0–15.0)
Immature Granulocytes: 1 %
Lymphocytes Relative: 4 %
Lymphs Abs: 0.9 10*3/uL (ref 0.7–4.0)
MCH: 28.7 pg (ref 26.0–34.0)
MCHC: 31.7 g/dL (ref 30.0–36.0)
MCV: 90.5 fL (ref 80.0–100.0)
Monocytes Absolute: 0.3 10*3/uL (ref 0.1–1.0)
Monocytes Relative: 1 %
Neutro Abs: 21.7 10*3/uL — ABNORMAL HIGH (ref 1.7–7.7)
Neutrophils Relative %: 94 %
Platelets: 133 10*3/uL — ABNORMAL LOW (ref 150–400)
RBC: 4.43 MIL/uL (ref 3.87–5.11)
RDW: 14.1 % (ref 11.5–15.5)
WBC: 23.2 10*3/uL — ABNORMAL HIGH (ref 4.0–10.5)
nRBC: 0 % (ref 0.0–0.2)

## 2019-12-19 LAB — CULTURE, RESPIRATORY W GRAM STAIN: Culture: NORMAL

## 2019-12-19 LAB — COMPREHENSIVE METABOLIC PANEL
ALT: 27 U/L (ref 0–44)
AST: 22 U/L (ref 15–41)
Albumin: 2.6 g/dL — ABNORMAL LOW (ref 3.5–5.0)
Alkaline Phosphatase: 104 U/L (ref 38–126)
Anion gap: 9 (ref 5–15)
BUN: 41 mg/dL — ABNORMAL HIGH (ref 8–23)
CO2: 30 mmol/L (ref 22–32)
Calcium: 8.6 mg/dL — ABNORMAL LOW (ref 8.9–10.3)
Chloride: 107 mmol/L (ref 98–111)
Creatinine, Ser: 0.85 mg/dL (ref 0.44–1.00)
GFR calc Af Amer: >60 mL/min (ref >60)
GFR calc non Af Amer: >60 mL/min (ref >60)
Glucose, Bld: 125 mg/dL — ABNORMAL HIGH (ref 70–99)
Potassium: 4.2 mmol/L (ref 3.5–5.1)
Sodium: 146 mmol/L — ABNORMAL HIGH (ref 135–145)
Total Bilirubin: 0.9 mg/dL (ref 0.3–1.2)
Total Protein: 6.4 g/dL — ABNORMAL LOW (ref 6.5–8.1)

## 2019-12-19 LAB — GLUCOSE, CAPILLARY
Glucose-Capillary: 147 mg/dL — ABNORMAL HIGH (ref 70–99)
Glucose-Capillary: 157 mg/dL — ABNORMAL HIGH (ref 70–99)
Glucose-Capillary: 338 mg/dL — ABNORMAL HIGH (ref 70–99)
Glucose-Capillary: 172 mg/dL — ABNORMAL HIGH (ref 70–99)

## 2019-12-19 LAB — PROCALCITONIN: Procalcitonin: 0.52 ng/mL

## 2019-12-19 LAB — MAGNESIUM: Magnesium: 2.5 mg/dL — ABNORMAL HIGH (ref 1.7–2.4)

## 2019-12-19 MED ORDER — ADULT MULTIVITAMIN W/MINERALS CH: 1.0000 | ORAL_TABLET | Freq: Every day | ORAL | Status: AC

## 2019-12-19 MED ORDER — METHYLPREDNISOLONE SODIUM SUCC 40 MG IJ SOLR: 40.0000 mg | Freq: Every day | INTRAMUSCULAR | 0 refills | Status: AC

## 2019-12-19 MED ORDER — INSULIN ASPART 100 UNIT/ML ~~LOC~~ SOLN: 0.0000 [IU] | Freq: Every day | SUBCUTANEOUS | 11 refills | Status: AC

## 2019-12-19 MED ORDER — FOLIC ACID 1 MG PO TABS: 1.0000 mg | ORAL_TABLET | Freq: Every day | ORAL | 0 refills | Status: AC

## 2019-12-19 MED ORDER — ENSURE ENLIVE PO LIQD: 237.0000 mL | Freq: Two times a day (BID) | ORAL | 12 refills | Status: AC

## 2019-12-19 MED ORDER — INSULIN ASPART 100 UNIT/ML ~~LOC~~ SOLN
6.0000 [IU] | Freq: Three times a day (TID) | SUBCUTANEOUS | Status: DC
  Administered 2019-12-20 (×2): 6 [IU] via SUBCUTANEOUS
  Filled 2019-12-19 (×2): qty 1

## 2019-12-19 MED ORDER — INSULIN ASPART 100 UNIT/ML ~~LOC~~ SOLN: 0.0000 [IU] | Freq: Three times a day (TID) | SUBCUTANEOUS | 11 refills | Status: AC

## 2019-12-19 MED ORDER — SODIUM CHLORIDE 0.9 % IV SOLN: 10.0000 mL | INTRAVENOUS | 0 refills | Status: AC | PRN

## 2019-12-19 MED ORDER — SALINE SPRAY 0.65 % NA SOLN: 1.0000 | NASAL | 0 refills | Status: AC | PRN

## 2019-12-19 MED ORDER — FUROSEMIDE 20 MG PO TABS: 20.0000 mg | ORAL_TABLET | Freq: Every day | ORAL | Status: AC

## 2019-12-19 MED ORDER — INSULIN ASPART 100 UNIT/ML ~~LOC~~ SOLN: 3.0000 [IU] | Freq: Three times a day (TID) | SUBCUTANEOUS | 11 refills | Status: AC

## 2019-12-19 MED ORDER — FLUTICASONE PROPIONATE 50 MCG/ACT NA SUSP: 2.0000 | Freq: Every day | NASAL | 2 refills | Status: AC

## 2019-12-19 MED ORDER — UMECLIDINIUM BROMIDE 62.5 MCG/INH IN AEPB: 1.0000 | INHALATION_SPRAY | Freq: Every day | RESPIRATORY_TRACT | Status: AC

## 2019-12-19 MED ORDER — LORATADINE 10 MG PO TABS: 10.0000 mg | ORAL_TABLET | Freq: Every day | ORAL | Status: AC

## 2019-12-19 MED ORDER — IPRATROPIUM-ALBUTEROL 20-100 MCG/ACT IN AERS: 1.0000 | INHALATION_SPRAY | Freq: Four times a day (QID) | RESPIRATORY_TRACT | Status: AC

## 2019-12-19 MED ORDER — ENOXAPARIN SODIUM 40 MG/0.4ML ~~LOC~~ SOLN: 40.0000 mg | SUBCUTANEOUS | Status: AC

## 2019-12-19 MED ORDER — FLUTICASONE FUROATE-VILANTEROL 100-25 MCG/INH IN AEPB: 1.0000 | INHALATION_SPRAY | Freq: Every day | RESPIRATORY_TRACT | Status: AC

## 2019-12-19 MED ORDER — ASCORBIC ACID 500 MG PO TABS: 500.0000 mg | ORAL_TABLET | Freq: Every day | ORAL | 0 refills | Status: AC

## 2019-12-19 MED ORDER — INSULIN GLARGINE 100 UNIT/ML ~~LOC~~ SOLN: 6.0000 [IU] | Freq: Every day | SUBCUTANEOUS | 11 refills | Status: AC

## 2019-12-19 NOTE — Progress Notes (Signed)
OT Cancellation Note  Patient Details Name: Marissa Luna MRN: [id number] DOB: 1956/07/03   Cancelled Treatment:    Reason Eval/Treat Not Completed: Other (comment). Spoke with RN by phone who reports pt continues to be very SOB with exertional activity. PT just finished working with pt and reported to the RN the same. Per RN, requests hold OT tx this date and re-attempt next date as medically appropriate.   Richrd Prime, MPH, MS, OTR/L ascom [phone] 12/19/19, 10:08 AM

## 2019-12-19 NOTE — Progress Notes (Addendum)
PROGRESS NOTE    Marissa Luna  KKX:[id number] DOB: 1955-12-31 DOA: 2019/12/29  PCP: Ronnell Freshwater, NP    LOS - 5   Brief Narrative:  64 y.o.femalewith medical history of sarcoidosis and COPD with chronic respiratory failure on 2L/min home O2, hypertension and recently hospitalizedfrom 11/30/19 - 12/30/20with COVID-19 pneumonia and acute on chronic respiratory failure treated with 5 days of remdesivir and Decadron.  She presented to the ED on 1/8 with a complaints of increased cough, weakness and shortness of breath.In the ED, afebrile, mildly tachycardic and tachypneic, hypoxic in 80's on room air.   Labs notable for leukocytosis 17k, elevated inflammatory markers including D-dimer of 1572, ferritin 645, CRP 41.6.  CT chest showed hazy groundglass opacities seen within both lungs consistent with COVID-19 pneumonia, and interval progression of sarcoidosis compared to 2011.  She was started on IV antibiotics, dexamethasone and hospitalist consulted for admission.   Subjective 1/14: Patient had increased oxygen requirements up to 40 L/min HFNC, since weaned to 15 L/min.  She reports she felt short of breath, almost like a panic feeling.  We discussed potential transfer to [facility] and she is agreeable.  States feeling a little better today than overnight and earlier AM.  Assessment & Plan:   Principal Problem:   Acute on chronic respiratory failure with hypoxia (HCC) Active Problems:   Sarcoidosis of lung (HCC)   Essential hypertension   Weakness   Healthcare-associated pneumonia   History of COVID-19   Acute respiratory failure (HCC)   Multifocal pneumonia   Type 2 diabetes mellitus without complication, without long-term current use of insulin (HCC)   Acute on chronic respiratory failure with hypoxia in setting of recent Covid-19 pneumonia, admitted 12/26 to 12/30.  Completed treatment with 5 days remdesivir and 10 days Decadron. Patient has been using 2 L/min nocturnal oxygen at  her baseline. Increased oxygen requirements 1/13-1/14, up to 40 L/min HFNC but now back down to 15 L, this from 8 L/min yesterday.   --Pulmonology following given underlying progressive sarcoidosis  --Continue Solu-Medrol per pulmonary --Expect she will need a longer steroid taper --supplemental oxygen to keep O2 sat > 90% --since patient is 2 days away from coming off isolation (positive test was 19 days ago), will defer transfer to [place] --procal and CRP with Am labs  Bilateral lobar pneumonia In setting of recent COVID-19 pneumonia.   Completed course of Zithromax. --Continue Rocephin  Sarcoidosis Noted to have progressed since 2011 on CT scan obtained on admission --Continue steroids as above --Follow with pulmonology  Type 2 diabetes A1c 8.0%.  Not on medications for diabetes as outpatient. --continue Lantus 6 units at bedtime --increase NovoLog to 6 units with meals --continue sensitive slliding scale NovoLog --Should be discharged on an oral medication and follow-up with PCP closely --Carb modified diet   Essential hypertension -chronic, stable -Continue Norvasc  Restless leg syndrome -chronic, stable -Continue Requip  Generalized weakness -secondary to recent acute illnesses and underlying comorbidities. --PT eval --Home health PT recommended, TOC consulted to arrange   DVT prophylaxis: Lovenox   Code Status: Full Code  Family Communication: daughter, Paulita Cradle, updated by phone, discussed potential [place] transfer and she was agreeable Disposition Plan: Pending further clinical improvement.  May require LTAC if unable to wean down oxygen.  Otherwise plan would be for discharge home with home health PT.  Patient currently continues to require hospital care given significant oxygen requirements.  Consultants:   Pulmonology  Procedures:   None  Antimicrobials:  Rocephin   Zithromax completed   Objective: Vitals:   12/19/19 0420 12/19/19 0553  12/19/19 1129 12/19/19 1418  BP: 119/66   118/64  Pulse: (!) 109   (!) 108  Resp: 18     Temp: 99.9 F (37.7 C) 98.1 F (36.7 C)  98.8 F (37.1 C)  TempSrc: Oral Oral  Oral  SpO2: 97%  95% 95%  Weight:      Height:        Intake/Output Summary (Last 24 hours) at 12/19/2019 1529 Last data filed at 12/19/2019 1417 Gross per 24 hour  Intake 180 ml  Output 775 ml  Net -595 ml   Filed Weights   01/01/2020 2252  Weight: 71.2 kg    Examination:  General exam: awake, alert, no acute distress, appears comfortable Respiratory system: bibasilar crackles and overall diminished, no wheezes, rales or rhonchi, normal respiratory effort. Cardiovascular system: normal S1/S2, RRR, no JVD, murmurs, rubs, gallops, no pedal edema.   Central nervous system: alert and oriented x4. no gross focal neurologic deficits, normal speech Extremities: moves all, no edema, normal tone Skin: dry, intact, normal temperature Psychiatry: normal mood, congruent affect, judgement and insight appear normal    Data Reviewed: I have personally reviewed following labs and imaging studies  CBC: Recent Labs  Lab 12/25/2019 2332 12/14/19 0344 12/19/19 0435  WBC 17.8* 17.6* 23.2*  NEUTROABS 15.7*  --  21.7*  HGB 13.4 13.1 12.7  HCT 42.8 41.9 40.1  MCV 91.3 90.9 90.5  PLT 211 196 133*   Basic Metabolic Panel: Recent Labs  Lab 12/18/2019 2332 12/15/19 0511 12/16/19 0508 12/19/19 0435  NA 142  --   --  146*  K 4.0  --   --  4.2  CL 107  --   --  107  CO2 25  --   --  30  GLUCOSE 217*  --   --  125*  BUN 27*  --   --  41*  CREATININE 1.00 0.83 0.97 0.85  CALCIUM 8.5*  --   --  8.6*  MG  --   --   --  2.5*   GFR: Estimated Creatinine Clearance: 57.4 mL/min (by C-G formula based on SCr of 0.85 mg/dL). Liver Function Tests: Recent Labs  Lab 12/16/2019 2332 12/19/19 0435  AST 31 22  ALT 30 27  ALKPHOS 83 104  BILITOT 0.7 0.9  PROT 7.6 6.4*  ALBUMIN 2.8* 2.6*   No results for input(s): LIPASE,  AMYLASE in the last 168 hours. No results for input(s): AMMONIA in the last 168 hours. Coagulation Profile: No results for input(s): INR, PROTIME in the last 168 hours. Cardiac Enzymes: No results for input(s): CKTOTAL, CKMB, CKMBINDEX, TROPONINI in the last 168 hours. BNP (last 3 results) No results for input(s): PROBNP in the last 8760 hours. HbA1C: No results for input(s): HGBA1C in the last 72 hours. CBG: Recent Labs  Lab 12/18/19 1138 12/18/19 1641 12/18/19 2131 12/19/19 0814 12/19/19 1217  GLUCAP 369* 293* 131* 147* 157*   Lipid Profile: No results for input(s): CHOL, HDL, LDLCALC, TRIG, CHOLHDL, LDLDIRECT in the last 72 hours. Thyroid Function Tests: No results for input(s): TSH, T4TOTAL, FREET4, T3FREE, THYROIDAB in the last 72 hours. Anemia Panel: No results for input(s): VITAMINB12, FOLATE, FERRITIN, TIBC, IRON, RETICCTPCT in the last 72 hours. Sepsis Labs: Recent Labs  Lab 12/26/2019 2332  PROCALCITON 0.25  LATICACIDVEN 1.4    Recent Results (from the past 240 hour(s))  Blood Culture (routine x  2)     Status: None   Collection Time: 12/11/2019 11:32 PM   Specimen: Right Antecubital; Blood  Result Value Ref Range Status   Specimen Description RIGHT ANTECUBITAL  Final   Special Requests Blood Culture adequate volume  Final   Culture   Final    NO GROWTH 5 DAYS Performed at [facility], [address] [place], [place] [address]    Report Status 12/18/2019 FINAL  Final  Blood Culture (routine x 2)     Status: None   Collection Time: 12/09/2019 11:33 PM   Specimen: Left Antecubital; Blood  Result Value Ref Range Status   Specimen Description LEFT ANTECUBITAL  Final   Special Requests   Final    Blood Culture results may not be optimal due to an inadequate volume of blood received in culture bottles   Culture   Final    NO GROWTH 5 DAYS Performed at [facility], [address] [place], [place] [address]    Report Status 12/18/2019 FINAL   Final  Respiratory Panel by PCR     Status: None   Collection Time: 12/14/19  9:05 AM   Specimen: Nasopharyngeal Swab; Respiratory  Result Value Ref Range Status   Adenovirus NOT DETECTED NOT DETECTED Final   Coronavirus 229E NOT DETECTED NOT DETECTED Final    Comment: (NOTE) The Coronavirus on the Respiratory Panel, DOES NOT test for the novel  Coronavirus (2019 nCoV)    Coronavirus HKU1 NOT DETECTED NOT DETECTED Final   Coronavirus NL63 NOT DETECTED NOT DETECTED Final   Coronavirus OC43 NOT DETECTED NOT DETECTED Final   Metapneumovirus NOT DETECTED NOT DETECTED Final   Rhinovirus / Enterovirus NOT DETECTED NOT DETECTED Final   Influenza A NOT DETECTED NOT DETECTED Final   Influenza B NOT DETECTED NOT DETECTED Final   Parainfluenza Virus 1 NOT DETECTED NOT DETECTED Final   Parainfluenza Virus 2 NOT DETECTED NOT DETECTED Final   Parainfluenza Virus 3 NOT DETECTED NOT DETECTED Final   Parainfluenza Virus 4 NOT DETECTED NOT DETECTED Final   Respiratory Syncytial Virus NOT DETECTED NOT DETECTED Final   Bordetella pertussis NOT DETECTED NOT DETECTED Final   Chlamydophila pneumoniae NOT DETECTED NOT DETECTED Final   Mycoplasma pneumoniae NOT DETECTED NOT DETECTED Final    Comment: Performed at [facility], [address] [place], [place] [address]  MRSA PCR Screening     Status: None   Collection Time: 12/15/19  2:39 PM   Specimen: Nasal Mucosa; Nasopharyngeal  Result Value Ref Range Status   MRSA by PCR NEGATIVE NEGATIVE Final    Comment:        The GeneXpert MRSA Assay (FDA approved for NASAL specimens only), is one component of a comprehensive MRSA colonization surveillance program. It is not intended to diagnose MRSA infection nor to guide or monitor treatment for MRSA infections. Performed at [facility], [address] [place], [place] [address]   Sputum culture     Status: None   Collection Time: 12/16/19  2:30 AM   Specimen: Sputum  Result Value  Ref Range Status   Specimen Description SPUTUM  Final   Special Requests NONE  Final   Sputum evaluation   Final    THIS SPECIMEN IS ACCEPTABLE FOR SPUTUM CULTURE Performed at [facility], [address], [place], [place] [address]    Report Status 12/16/2019 FINAL  Final  Culture, respiratory     Status: None   Collection Time: 12/16/19  2:30 AM   Specimen: SPU  Result Value Ref Range Status   Specimen Description   Final    SPUTUM Performed at [facility], [address] [place], [place] [address]    Special Requests   Final    NONE Reflexed from [phone] Performed at [facility], [address] [place], [place] [address]    Gram Stain   Final    RARE WBC PRESENT, PREDOMINANTLY PMN FEW GRAM POSITIVE COCCI IN PAIRS FEW GRAM NEGATIVE RODS    Culture   Final    FEW Consistent with normal respiratory flora. Performed at [facility] [place], [address], [place], [place] [address]    Report Status 12/19/2019 FINAL  Final         Radiology Studies: No results found.      Scheduled Meds: . amLODipine  10 mg Oral Daily  . ascorbic acid  500 mg Oral Daily  . calcium-vitamin D  1 tablet Oral Daily  . enoxaparin (LOVENOX) injection  40 mg Subcutaneous Q24H  . feeding supplement (ENSURE ENLIVE)  237 mL Oral BID BM  . fluticasone  2 spray Each Nare Daily  . fluticasone furoate-vilanterol  1 puff Inhalation Daily   And  . umeclidinium bromide  1 puff Inhalation Daily  . folic acid  1 mg Oral Daily  . furosemide  20 mg Oral Daily  . insulin aspart  0-5 Units Subcutaneous QHS  . insulin aspart  0-9 Units Subcutaneous TID WC  . insulin aspart  3 Units Subcutaneous TID WC  . insulin glargine  6 Units Subcutaneous QHS  . Ipratropium-Albuterol  1 puff Inhalation Q6H  . loratadine  10 mg Oral Daily  . methylPREDNISolone (SOLU-MEDROL) injection  40 mg Intravenous Daily  . multivitamin with minerals  1 tablet Oral Daily  . rOPINIRole  0.25  mg Oral QHS   Continuous Infusions: . sodium chloride 20 mL (12/18/19 1006)     LOS: 5 days    Time spent: 40-45 minutes    Pennie Banter, DO [facility]   If 7PM-7AM, please contact night-coverage [email or web address] Password [facility] 12/19/2019, 3:29 PM

## 2019-12-19 NOTE — Progress Notes (Signed)
Physical Therapy Treatment Patient Details Name: Marissa Luna MRN: [id number] DOB: 1956/09/27 Today's Date: 12/19/2019    History of Present Illness Pt admitted for acute on chronic respiratory failure with hypoxia secondary to Covid. She complains of cough, weakness, and SOB symptoms. History includes sarcodosis, COPD, CRD on 2L of O2 chronic, HTN and recent hospital stay for covid 12/26- 12/30    PT Comments    Patient alert, agreeable to PT. Noticeable increased work of breathing at rest and with exertion. Since last PT session patient has had a significant increase in oxygen needs; currently on 40L at 58%. Pt performed supine bed level exercises with multimodal cues, desatted to mid 80s with multi-joint exercises, and patient needed extensive rest breaks, further mobility held. Discharge recommendations needed updating; recommendation of LTACH or SNF as able, RN, MD, and CSW notified.       Follow Up Recommendations  LTACH;SNF     Equipment Recommendations  Rolling walker with 5" wheels    Recommendations for Other Services       Precautions / Restrictions Precautions Precautions: Fall Precaution Comments: watch O2 Restrictions Weight Bearing Restrictions: No    Mobility  Bed Mobility               General bed mobility comments: deferred, exercises performed in bed. Pt desatted with exercises, further mobility deferred  Transfers                 General transfer comment: deferred  Ambulation/Gait                 Stairs             Wheelchair Mobility    Modified Rankin (Stroke Patients Only)       Balance                                            Cognition Arousal/Alertness: Awake/alert Behavior During Therapy: WFL for tasks assessed/performed Overall Cognitive Status: Within Functional Limits for tasks assessed                                        Exercises General Exercises - Lower  Extremity Ankle Circles/Pumps: AROM;Both;10 reps;Strengthening Quad Sets: AROM;Strengthening;Both;10 reps Gluteal Sets: AROM;Strengthening;Both;10 reps Short Arc Quad: AROM;Strengthening;Both;10 reps Heel Slides: AROM;Strengthening;Both;10 reps Hip ABduction/ADduction: AROM;Strengthening;Both;10 reps Other Exercises Other Exercises: Pt instructed in PLB as well as activity pacing    General Comments        Pertinent Vitals/Pain Pain Assessment: No/denies pain    Home Living                      Prior Function            PT Goals (current goals can now be found in the care plan section) Progress towards PT goals: Progressing toward goals    Frequency    Min 2X/week      PT Plan Discharge plan needs to be updated    Co-evaluation              AM-PAC PT "6 Clicks" Mobility   Outcome Measure  Help needed turning from your back to your side while in a flat bed without using bedrails?: A Lot Help needed moving from lying on  your back to sitting on the side of a flat bed without using bedrails?: A Lot Help needed moving to and from a bed to a chair (including a wheelchair)?: A Lot Help needed standing up from a chair using your arms (e.g., wheelchair or bedside chair)?: A Lot Help needed to walk in hospital room?: A Lot Help needed climbing 3-5 steps with a railing? : A Lot 6 Click Score: 12    End of Session Equipment Utilized During Treatment: Oxygen(40L at 58%) Activity Tolerance: Other (comment);Treatment limited secondary to medical complications (Comment)(oxygen status) Patient left: in bed;with call bell/phone within reach Nurse Communication: Mobility status PT Visit Diagnosis: Muscle weakness (generalized) (M62.81);Difficulty in walking, not elsewhere classified (R26.2)     Time: 4199-1444 PT Time Calculation (min) (ACUTE ONLY): 31 min  Charges:  $Therapeutic Exercise: 23-37 mins                    Olga Coaster PT, DPT 11:15  AM,12/19/19

## 2019-12-20 LAB — CBC WITH DIFFERENTIAL/PLATELET
Abs Immature Granulocytes: 0.12 10*3/uL — ABNORMAL HIGH (ref 0.00–0.07)
Basophils Absolute: 0.1 10*3/uL (ref 0.0–0.1)
Basophils Relative: 0 %
Eosinophils Absolute: 0 10*3/uL (ref 0.0–0.5)
Eosinophils Relative: 0 %
HCT: 37.7 % (ref 36.0–46.0)
Hemoglobin: 11.9 g/dL — ABNORMAL LOW (ref 12.0–15.0)
Immature Granulocytes: 1 %
Lymphocytes Relative: 3 %
Lymphs Abs: 0.6 10*3/uL — ABNORMAL LOW (ref 0.7–4.0)
MCH: 28.8 pg (ref 26.0–34.0)
MCHC: 31.6 g/dL (ref 30.0–36.0)
MCV: 91.3 fL (ref 80.0–100.0)
Monocytes Absolute: 0.4 10*3/uL (ref 0.1–1.0)
Monocytes Relative: 2 %
Neutro Abs: 18.6 10*3/uL — ABNORMAL HIGH (ref 1.7–7.7)
Neutrophils Relative %: 94 %
Platelets: 118 10*3/uL — ABNORMAL LOW (ref 150–400)
RBC: 4.13 MIL/uL (ref 3.87–5.11)
RDW: 13.9 % (ref 11.5–15.5)
WBC: 19.8 10*3/uL — ABNORMAL HIGH (ref 4.0–10.5)
nRBC: 0 % (ref 0.0–0.2)

## 2019-12-20 LAB — BASIC METABOLIC PANEL
Anion gap: 7 (ref 5–15)
BUN: 41 mg/dL — ABNORMAL HIGH (ref 8–23)
CO2: 30 mmol/L (ref 22–32)
Calcium: 8.4 mg/dL — ABNORMAL LOW (ref 8.9–10.3)
Chloride: 106 mmol/L (ref 98–111)
Creatinine, Ser: 0.96 mg/dL (ref 0.44–1.00)
GFR calc Af Amer: >60 mL/min (ref >60)
GFR calc non Af Amer: >60 mL/min (ref >60)
Glucose, Bld: 201 mg/dL — ABNORMAL HIGH (ref 70–99)
Potassium: 4.3 mmol/L (ref 3.5–5.1)
Sodium: 143 mmol/L (ref 135–145)

## 2019-12-20 LAB — GLUCOSE, CAPILLARY
Glucose-Capillary: 136 mg/dL — ABNORMAL HIGH (ref 70–99)
Glucose-Capillary: 207 mg/dL — ABNORMAL HIGH (ref 70–99)
Glucose-Capillary: 422 mg/dL — ABNORMAL HIGH (ref 70–99)
Glucose-Capillary: 127 mg/dL — ABNORMAL HIGH (ref 70–99)

## 2019-12-20 LAB — MAGNESIUM: Magnesium: 2.6 mg/dL — ABNORMAL HIGH (ref 1.7–2.4)

## 2019-12-20 LAB — PROCALCITONIN: Procalcitonin: 0.64 ng/mL

## 2019-12-20 LAB — C-REACTIVE PROTEIN: CRP: 29.6 mg/dL — ABNORMAL HIGH (ref <1.0)

## 2019-12-20 MED ORDER — METHYLPREDNISOLONE SODIUM SUCC 125 MG IJ SOLR
60.0000 mg | Freq: Two times a day (BID) | INTRAMUSCULAR | Status: DC
  Administered 2019-12-20 – 2019-12-23 (×6): 60 mg via INTRAVENOUS
  Filled 2019-12-20 (×6): qty 2

## 2019-12-20 MED ORDER — INSULIN ASPART 100 UNIT/ML ~~LOC~~ SOLN
9.0000 [IU] | Freq: Three times a day (TID) | SUBCUTANEOUS | Status: DC
  Administered 2019-12-20 – 2019-12-21 (×4): 9 [IU] via SUBCUTANEOUS
  Filled 2019-12-20 (×4): qty 1

## 2019-12-20 MED ORDER — INSULIN ASPART 100 UNIT/ML ~~LOC~~ SOLN
10.0000 [IU] | Freq: Once | SUBCUTANEOUS | Status: AC
  Administered 2019-12-20: 10 [IU] via SUBCUTANEOUS

## 2019-12-20 MED ORDER — LIVING WELL WITH DIABETES BOOK
Freq: Once | Status: AC
  Filled 2019-12-20 (×2): qty 1

## 2019-12-20 NOTE — Progress Notes (Signed)
Physical Therapy Treatment Patient Details Name: Marissa Luna MRN: [id number] DOB: 05-17-1956 Today's Date: 12/20/2019    History of Present Illness Pt admitted for acute on chronic respiratory failure with hypoxia secondary to Covid. She complains of cough, weakness, and SOB symptoms. History includes sarcodosis, COPD, CRD on 2L of O2 chronic, HTN and recent hospital stay for covid 12/26- 12/30      PT Comments    Patient alert, oriented, very motivated to participate with therapy. Pt stated she has been doing her exercises all day and last night. Pt now on 15L via HFNC via green tubing. Supine to sit with supervision. Patient sat EOB >82minutes with supervision to participate in exercises and ADLs (face washing, teeth brushed, etc). SpO2 monitored continuously, occasional desat to 86-87%, biggest drop to 85% upon returning to supine with HOB elevated. Recovered to 90% with cues for PLB within 2 minutes. The patient would benefit from further skilled PT intervention to maximize mobility, safety, and activity tolerance/endurance. Instructed to sit on side of bed during the day for medication administration with nursing staff.    Follow Up Recommendations  LTACH;SNF     Equipment Recommendations  Rolling walker with 5" wheels    Recommendations for Other Services       Precautions / Restrictions Precautions Precautions: Fall Precaution Comments: watch O2    Mobility  Bed Mobility   Bed Mobility: Supine to Sit     Supine to sit: Supervision        Transfers                 General transfer comment: deferred  Ambulation/Gait                 Stairs             Wheelchair Mobility    Modified Rankin (Stroke Patients Only)       Balance Overall balance assessment: Needs assistance Sitting-balance support: Feet supported Sitting balance-Leahy Scale: Good                                      Cognition Arousal/Alertness:  Awake/alert Behavior During Therapy: WFL for tasks assessed/performed Overall Cognitive Status: Within Functional Limits for tasks assessed                                        Exercises Other Exercises Other Exercises: Patient performed seated LAQ x10 BLE, ankle pumps BLE x15, seated marching x10 BLE, arm raises x10 BLE. Also at EOB brushed teeth, washed face. SpO2 monitoring continuously, occasional desat to 86-87%, biggest drop to 85% upon returning to supine with HOB elevated. Recovered to90% with cues for PLB within 2 minutes.    General Comments        Pertinent Vitals/Pain Pain Assessment: No/denies pain    Home Living                      Prior Function            PT Goals (current goals can now be found in the care plan section) Progress towards PT goals: Progressing toward goals    Frequency    Min 2X/week      PT Plan Current plan remains appropriate    Co-evaluation  AM-PAC PT "6 Clicks" Mobility   Outcome Measure  Help needed turning from your back to your side while in a flat bed without using bedrails?: A Little Help needed moving from lying on your back to sitting on the side of a flat bed without using bedrails?: A Little Help needed moving to and from a bed to a chair (including a wheelchair)?: A Little Help needed standing up from a chair using your arms (e.g., wheelchair or bedside chair)?: A Little Help needed to walk in hospital room?: A Little Help needed climbing 3-5 steps with a railing? : A Little 6 Click Score: 18    End of Session Equipment Utilized During Treatment: Oxygen(15L via green tube HFNC) Activity Tolerance: Patient tolerated treatment well Patient left: in bed;with call bell/phone within reach;with bed alarm set Nurse Communication: Mobility status PT Visit Diagnosis: Muscle weakness (generalized) (M62.81);Difficulty in walking, not elsewhere classified (R26.2)     Time:  3086-5784 PT Time Calculation (min) (ACUTE ONLY): 38 min  Charges:  $Therapeutic Exercise: 23-37 mins $Therapeutic Activity: 8-22 mins                     Lieutenant Diego PT, DPT 3:00 PM,12/20/19

## 2019-12-20 NOTE — Progress Notes (Signed)
Inpatient Diabetes Program Recommendations  AACE/ADA: New Consensus Statement on Inpatient Glycemic Control (2015)  Target Ranges:  Prepandial:   less than 140 mg/dL      Peak postprandial:   less than 180 mg/dL (1-2 hours)      Critically ill patients:  140 - 180 mg/dL   Lab Results  Component Value Date   GLUCAP 422 (H) 12/20/2019   HGBA1C 8.0 (H) 12/14/2019    Review of Glycemic Control  Diabetes history: New DM Outpatient Diabetes medications: None Current orders for Inpatient glycemic control: Novolog 0-9 TID with meals+ Novolog 0-5 QHS + Lantus 6 units QHS + Novolog 9 units TID with meals  Note: followed up with patient today about her new Diagnoses of DM2.  She states she does not have any further questions at this time.  She would like some literature on diabetes.  Attaching education to AVS and ordering Living Well with Diabetes booklet.  Thank you, Dulce Sellar, RN, BSN Diabetes Coordinator Inpatient Diabetes Program [phone] (team pager from 8a-5p)

## 2019-12-20 NOTE — Progress Notes (Signed)
PROGRESS NOTE    Marissa Luna  XLK:[id number] DOB: 1956-11-25 DOA: 2019/12/26  PCP: Carlean Jews, NP    LOS - 6   Brief Narrative:  64 y.o.femalewith medical historyofsarcoidosis and COPD with chronic respiratory failure on2L/min homeO2, hypertension and recently hospitalizedfrom 11/30/19 - 12/30/20withCOVID-19pneumonia andacute on chronicrespiratory failure treated with 5 days of remdesivir and Decadron. She presented to the ED on 1/8with a complaintsof increased cough, weakness and shortness of breath.In the ED, afebrile, mildly tachycardic and tachypneic, hypoxic in 80's on room air. Labs notable for leukocytosis 17k, elevated inflammatory markers including D-dimer of 1572, ferritin 645, CRP 41.6. CT chest showed hazy groundglass opacities seen within both lungs consistent with COVID-19 pneumonia,and interval progression of sarcoidosis compared to 2011. She was started on IV antibiotics, dexamethasone and admitted to hospitalist service.  Subjective 1/15: Patient seen this morning at bedside.  She reports she feels okay today.  States she is eating well.  Denies any worsening shortness of breath.  No fevers or chills, chest pain, nausea vomiting or diarrhea.  No acute events reported.  Assessment & Plan:   Principal Problem:   Acute on chronic respiratory failure with hypoxia (HCC) Active Problems:   Sarcoidosis of lung (HCC)   Essential hypertension   Weakness   Healthcare-associated pneumonia   History of COVID-19   Acute respiratory failure (HCC)   Multifocal pneumonia   Type 2 diabetes mellitus without complication, without long-term current use of insulin (HCC)   Acute on chronic respiratory failure with hypoxia in setting of recent Covid-19 pneumonia, admitted 12/26 to 12/30.  Completed treatment with 5 days remdesivir and 10 days Decadron. Patient has been using 2 L/min nocturnal oxygen at her baseline. Increased oxygen requirements 1/13-1/14, up to  40 L/min HFNC but now back down to 15 L, this from 8 L/min  previously.  --Pulmonology following given underlying progressive sarcoidosis --Increased Solu-Medrol to 60 mg twice daily --Expect she will need a longer steroid taper --supplemental oxygen to keep O2 sat > 90% --Transfer to [facility] deferred because patient is close to coming off isolation (positive test was 20 days ago) --procal and CRP with Am labs --May need LTAC if unable to wean down oxygen  Bilateral lobar pneumonia In setting of recent COVID-19 pneumonia.  Completed course of Zithromax and Rocephin.  Sarcoidosis Noted to have progressed since 2011 on CT scan obtained on admission.   Appears quite extensive and severe on personal review of CT scan. --Continue steroids as above --Follow with pulmonology  Type 2 diabetes A1c 8.0%. Not on medications for diabetes as outpatient. --continue Lantus 6 units at bedtime --increase NovoLog to 9 units with meals  --continue sensitive slliding scale NovoLog --Should be discharged on an oral medication and follow-up with PCP closely --Carb modified diet   Essential hypertension-chronic, stable -Continue Norvasc  Restless leg syndrome-chronic, stable -Continue Requip  Generalized weakness-secondary to recent acute illnesses and underlying comorbidities. --PT eval --Home health PT recommended,TOC consulted to arrange   DVT prophylaxis:Lovenox Code Status: Full Code Family Communication:None at bedside Disposition Plan:Pending further clinical improvement.  May require LTAC if unable to wean down oxygen.  Otherwise plan would be for discharge home with home health PT. Patient currently continues to require hospital care given significant oxygen requirements.  Consultants:  Pulmonology  Procedures:  None  Antimicrobials:  Rocephin  Zithromax completed   Objective: Vitals:   12/19/19 1418 12/19/19 1958 12/19/19 2012 12/20/19 0521   BP: 118/64  136/70 (!) 114/58  Pulse: Marland Kitchen)  108  (!) 103 (!) 108  Resp:   20 20  Temp: 98.8 F (37.1 C)  (!) 97.5 F (36.4 C) 97.7 F (36.5 C)  TempSrc: Oral  Oral Oral  SpO2: 95% 95% 96% 100%  Weight:      Height:        Intake/Output Summary (Last 24 hours) at 12/20/2019 0850 Last data filed at 12/20/2019 0525 Gross per 24 hour  Intake --  Output 1025 ml  Net -1025 ml   Filed Weights   2019/12/23 2252  Weight: 71.2 kg    Examination:  General exam: awake, alert, no acute distress HEENT: clear conjunctiva, anicteric sclera, moist mucus membranes, hearing grossly normal  Respiratory system: Bilateral crackles, no wheezes normal respiratory effort, on 15 L/min HFNC oxygen. Cardiovascular system: normal S1/S2, RRR, no JVD, murmurs, rubs, gallops, no pedal edema.   Central nervous system: alert and oriented x4. no gross focal neurologic deficits, normal speech Extremities: moves all, no edema, normal tone Psychiatry: normal mood, congruent affect, judgement and insight appear normal    Data Reviewed: I have personally reviewed following labs and imaging studies  CBC: Recent Labs  Lab 12-23-2019 2332 12/14/19 0344 12/19/19 0435 12/20/19 0611  WBC 17.8* 17.6* 23.2* 19.8*  NEUTROABS 15.7*  --  21.7* 18.6*  HGB 13.4 13.1 12.7 11.9*  HCT 42.8 41.9 40.1 37.7  MCV 91.3 90.9 90.5 91.3  PLT 211 196 133* 118*   Basic Metabolic Panel: Recent Labs  Lab 12-23-19 2332 12/15/19 0511 12/16/19 0508 12/19/19 0435 12/20/19 0611  NA 142  --   --  146* 143  K 4.0  --   --  4.2 4.3  CL 107  --   --  107 106  CO2 25  --   --  30 30  GLUCOSE 217*  --   --  125* 201*  BUN 27*  --   --  41* 41*  CREATININE 1.00 0.83 0.97 0.85 0.96  CALCIUM 8.5*  --   --  8.6* 8.4*  MG  --   --   --  2.5* 2.6*   GFR: Estimated Creatinine Clearance: 50.8 mL/min (by C-G formula based on SCr of 0.96 mg/dL). Liver Function Tests: Recent Labs  Lab 2019/12/23 2332 12/19/19 0435  AST 31 22  ALT 30  27  ALKPHOS 83 104  BILITOT 0.7 0.9  PROT 7.6 6.4*  ALBUMIN 2.8* 2.6*   No results for input(s): LIPASE, AMYLASE in the last 168 hours. No results for input(s): AMMONIA in the last 168 hours. Coagulation Profile: No results for input(s): INR, PROTIME in the last 168 hours. Cardiac Enzymes: No results for input(s): CKTOTAL, CKMB, CKMBINDEX, TROPONINI in the last 168 hours. BNP (last 3 results) No results for input(s): PROBNP in the last 8760 hours. HbA1C: No results for input(s): HGBA1C in the last 72 hours. CBG: Recent Labs  Lab 12/19/19 0814 12/19/19 1217 12/19/19 1720 12/19/19 2152 12/20/19 0824  GLUCAP 147* 157* 338* 172* 136*   Lipid Profile: No results for input(s): CHOL, HDL, LDLCALC, TRIG, CHOLHDL, LDLDIRECT in the last 72 hours. Thyroid Function Tests: No results for input(s): TSH, T4TOTAL, FREET4, T3FREE, THYROIDAB in the last 72 hours. Anemia Panel: No results for input(s): VITAMINB12, FOLATE, FERRITIN, TIBC, IRON, RETICCTPCT in the last 72 hours. Sepsis Labs: Recent Labs  Lab 2019-12-23 2332 12/19/19 2208 12/20/19 0611  PROCALCITON 0.25 0.52 0.64  LATICACIDVEN 1.4  --   --     Recent Results (from the past  240 hour(s))  Blood Culture (routine x 2)     Status: None   Collection Time: 01/01/2020 11:32 PM   Specimen: Right Antecubital; Blood  Result Value Ref Range Status   Specimen Description RIGHT ANTECUBITAL  Final   Special Requests Blood Culture adequate volume  Final   Culture   Final    NO GROWTH 5 DAYS Performed at [facility], [address] [place], [place] [address]    Report Status 12/18/2019 FINAL  Final  Blood Culture (routine x 2)     Status: None   Collection Time: 12/14/2019 11:33 PM   Specimen: Left Antecubital; Blood  Result Value Ref Range Status   Specimen Description LEFT ANTECUBITAL  Final   Special Requests   Final    Blood Culture results may not be optimal due to an inadequate volume of blood received in culture  bottles   Culture   Final    NO GROWTH 5 DAYS Performed at [facility], [address] [place], [place] [address]    Report Status 12/18/2019 FINAL  Final  Respiratory Panel by PCR     Status: None   Collection Time: 12/14/19  9:05 AM   Specimen: Nasopharyngeal Swab; Respiratory  Result Value Ref Range Status   Adenovirus NOT DETECTED NOT DETECTED Final   Coronavirus 229E NOT DETECTED NOT DETECTED Final    Comment: (NOTE) The Coronavirus on the Respiratory Panel, DOES NOT test for the novel  Coronavirus (2019 nCoV)    Coronavirus HKU1 NOT DETECTED NOT DETECTED Final   Coronavirus NL63 NOT DETECTED NOT DETECTED Final   Coronavirus OC43 NOT DETECTED NOT DETECTED Final   Metapneumovirus NOT DETECTED NOT DETECTED Final   Rhinovirus / Enterovirus NOT DETECTED NOT DETECTED Final   Influenza A NOT DETECTED NOT DETECTED Final   Influenza B NOT DETECTED NOT DETECTED Final   Parainfluenza Virus 1 NOT DETECTED NOT DETECTED Final   Parainfluenza Virus 2 NOT DETECTED NOT DETECTED Final   Parainfluenza Virus 3 NOT DETECTED NOT DETECTED Final   Parainfluenza Virus 4 NOT DETECTED NOT DETECTED Final   Respiratory Syncytial Virus NOT DETECTED NOT DETECTED Final   Bordetella pertussis NOT DETECTED NOT DETECTED Final   Chlamydophila pneumoniae NOT DETECTED NOT DETECTED Final   Mycoplasma pneumoniae NOT DETECTED NOT DETECTED Final    Comment: Performed at [facility], [place] [address], [place], [place] [address]  MRSA PCR Screening     Status: None   Collection Time: 12/15/19  2:39 PM   Specimen: Nasal Mucosa; Nasopharyngeal  Result Value Ref Range Status   MRSA by PCR NEGATIVE NEGATIVE Final    Comment:        The GeneXpert MRSA Assay (FDA approved for NASAL specimens only), is one component of a comprehensive MRSA colonization surveillance program. It is not intended to diagnose MRSA infection nor to guide or monitor treatment for MRSA infections. Performed at [facility], [address] [place], [place] [address]   Sputum culture     Status: None   Collection Time: 12/16/19  2:30 AM   Specimen: Sputum  Result Value Ref Range Status   Specimen Description SPUTUM  Final   Special Requests NONE  Final   Sputum evaluation   Final    THIS SPECIMEN IS ACCEPTABLE FOR SPUTUM CULTURE Performed at [facility], [address] [place], [place] [address]    Report Status 12/16/2019 FINAL  Final  Culture, respiratory  Status: None   Collection Time: 12/16/19  2:30 AM   Specimen: SPU  Result Value Ref Range Status   Specimen Description   Final    SPUTUM Performed at [facility], [address] [place], [place] [address]    Special Requests   Final    NONE Reflexed from [id number] Performed at [facility], [address] [place], [place] [address]    Gram Stain   Final    RARE WBC PRESENT, PREDOMINANTLY PMN FEW GRAM POSITIVE COCCI IN PAIRS FEW GRAM NEGATIVE RODS    Culture   Final    FEW Consistent with normal respiratory flora. Performed at [facility], [address], [place], [place] [address]    Report Status 12/19/2019 FINAL  Final         Radiology Studies: No results found.      Scheduled Meds: . amLODipine  10 mg Oral Daily  . ascorbic acid  500 mg Oral Daily  . calcium-vitamin D  1 tablet Oral Daily  . enoxaparin (LOVENOX) injection  40 mg Subcutaneous Q24H  . feeding supplement (ENSURE ENLIVE)  237 mL Oral BID BM  . fluticasone  2 spray Each Nare Daily  . fluticasone furoate-vilanterol  1 puff Inhalation Daily   And  . umeclidinium bromide  1 puff Inhalation Daily  . folic acid  1 mg Oral Daily  . furosemide  20 mg Oral Daily  . insulin aspart  0-5 Units Subcutaneous QHS  . insulin aspart  0-9 Units Subcutaneous TID WC  . insulin aspart  6 Units Subcutaneous TID WC  . insulin glargine  6 Units Subcutaneous QHS  . Ipratropium-Albuterol  1 puff Inhalation Q6H  .  loratadine  10 mg Oral Daily  . methylPREDNISolone (SOLU-MEDROL) injection  40 mg Intravenous Daily  . multivitamin with minerals  1 tablet Oral Daily  . rOPINIRole  0.25 mg Oral QHS   Continuous Infusions: . sodium chloride 20 mL (12/18/19 1006)     LOS: 6 days    Time spent: 30 minutes    Pennie Banter, DO [facility]   If 7PM-7AM, please contact night-coverage [email or web address] Password [facility] 12/20/2019, 8:50 AM

## 2019-12-21 LAB — BASIC METABOLIC PANEL
Anion gap: 12 (ref 5–15)
BUN: 40 mg/dL — ABNORMAL HIGH (ref 8–23)
CO2: 28 mmol/L (ref 22–32)
Calcium: 8.8 mg/dL — ABNORMAL LOW (ref 8.9–10.3)
Chloride: 104 mmol/L (ref 98–111)
Creatinine, Ser: 0.96 mg/dL (ref 0.44–1.00)
GFR calc Af Amer: >60 mL/min (ref >60)
GFR calc non Af Amer: >60 mL/min (ref >60)
Glucose, Bld: 229 mg/dL — ABNORMAL HIGH (ref 70–99)
Potassium: 4.5 mmol/L (ref 3.5–5.1)
Sodium: 144 mmol/L (ref 135–145)

## 2019-12-21 LAB — CBC WITH DIFFERENTIAL/PLATELET
Abs Immature Granulocytes: 0.15 10*3/uL — ABNORMAL HIGH (ref 0.00–0.07)
Basophils Absolute: 0 10*3/uL (ref 0.0–0.1)
Basophils Relative: 0 %
Eosinophils Absolute: 0 10*3/uL (ref 0.0–0.5)
Eosinophils Relative: 0 %
HCT: 40.2 % (ref 36.0–46.0)
Hemoglobin: 12.6 g/dL (ref 12.0–15.0)
Immature Granulocytes: 1 %
Lymphocytes Relative: 3 %
Lymphs Abs: 0.5 10*3/uL — ABNORMAL LOW (ref 0.7–4.0)
MCH: 28.6 pg (ref 26.0–34.0)
MCHC: 31.3 g/dL (ref 30.0–36.0)
MCV: 91.4 fL (ref 80.0–100.0)
Monocytes Absolute: 0.2 10*3/uL (ref 0.1–1.0)
Monocytes Relative: 1 %
Neutro Abs: 16.6 10*3/uL — ABNORMAL HIGH (ref 1.7–7.7)
Neutrophils Relative %: 95 %
Platelets: 122 10*3/uL — ABNORMAL LOW (ref 150–400)
RBC: 4.4 MIL/uL (ref 3.87–5.11)
RDW: 13.7 % (ref 11.5–15.5)
WBC: 17.6 10*3/uL — ABNORMAL HIGH (ref 4.0–10.5)
nRBC: 0 % (ref 0.0–0.2)

## 2019-12-21 LAB — MAGNESIUM: Magnesium: 2.5 mg/dL — ABNORMAL HIGH (ref 1.7–2.4)

## 2019-12-21 LAB — PROCALCITONIN: Procalcitonin: 0.47 ng/mL

## 2019-12-21 LAB — GLUCOSE, CAPILLARY
Glucose-Capillary: 211 mg/dL — ABNORMAL HIGH (ref 70–99)
Glucose-Capillary: 273 mg/dL — ABNORMAL HIGH (ref 70–99)
Glucose-Capillary: 262 mg/dL — ABNORMAL HIGH (ref 70–99)
Glucose-Capillary: 189 mg/dL — ABNORMAL HIGH (ref 70–99)

## 2019-12-21 MED ORDER — INSULIN ASPART 100 UNIT/ML ~~LOC~~ SOLN
13.0000 [IU] | Freq: Three times a day (TID) | SUBCUTANEOUS | Status: DC
  Administered 2019-12-22 (×2): 13 [IU] via SUBCUTANEOUS
  Filled 2019-12-21 (×2): qty 1

## 2019-12-21 MED ORDER — INSULIN GLARGINE 100 UNIT/ML ~~LOC~~ SOLN
12.0000 [IU] | Freq: Every day | SUBCUTANEOUS | Status: DC
  Administered 2019-12-21 – 2020-01-05 (×16): 12 [IU] via SUBCUTANEOUS
  Filled 2019-12-21 (×17): qty 0.12

## 2019-12-21 NOTE — Plan of Care (Signed)
Continuing with plan of care. 

## 2019-12-21 NOTE — Progress Notes (Addendum)
PROGRESS NOTE    Marissa Luna  PPJ:[id number] DOB: 01-16-1956 DOA: 12/14/2019  PCP: Carlean Jews, NP    LOS - 7   Brief Narrative:  64 y.o.femalewith medical historyofsarcoidosis and COPD with chronic respiratory failure on2L/min homeO2, hypertension and recently hospitalizedfrom 11/30/19 - 12/30/20withCOVID-19pneumonia andacute on chronicrespiratory failure treated with 5 days of remdesivir and Decadron. She presented to the ED on 1/8with a complaintsof increased cough, weakness and shortness of breath.In the ED, afebrile, mildly tachycardic and tachypneic, hypoxic in 80's on room air. Labs notable for leukocytosis 17k, elevated inflammatory markers including D-dimer of 1572, ferritin 645, CRP 41.6. CT chest showed hazy groundglass opacities seen within both lungs consistent with COVID-19 pneumonia,and interval progression of sarcoidosis compared to 2011. She was started on IV antibiotics, dexamethasone and admitted to hospitalist service.  Subjective 1/16: Patient seen this AM, sitting up edge of bed.  No acute events reported overnight.  She reports feeling about the same.  Still requiring 15 L of oxygen.  We discussed her prolonged need for oxygen due to her underlying sarcoidosis, and we reviewed the CT scan together so she could see how her lungs look.  Discussed potential need for LTAC placement if unable to wean down oxygen to a reasonable level.  No fevers or chills, no chest pain or other acute complaints.    Assessment & Plan:   Principal Problem:   Acute on chronic respiratory failure with hypoxia (HCC) Active Problems:   Sarcoidosis of lung (HCC)   Essential hypertension   Weakness   Healthcare-associated pneumonia   History of COVID-19   Acute respiratory failure (HCC)   Multifocal pneumonia   Type 2 diabetes mellitus without complication, without long-term current use of insulin (HCC)   Acute on chronic respiratory failure with hypoxiain  setting of recent Covid-19 pneumonia, admitted 12/26 to 12/30.Completed treatment with 5 days remdesivir and 10 days Decadron. Patienthas beenusing 2 L/min nocturnaloxygen atherbaseline.Increased oxygen requirements 1/13-1/14, up to 40 L/min HFNC but now back down to 15 L, this from8 L/min previously. --Pulmonology following given underlying progressive sarcoidosis --Solu-Medrol to 60 mg twice daily (increased from 40 daily on 1/15) --Expect she will need a long steroid taper --supplemental oxygen to keep O2 sat > 90% --May need LTAC if unable to wean down oxygen  Bilateral lobar pneumonia In setting of recent COVID-19 pneumonia.  Completed course of Zithromax and Rocephin.  Sarcoidosis Noted to have progressed since 2011 on CT scan obtained on admission.   Appears quite extensive and severe on personal review of CT scan. --Continue steroids as above --Follow with pulmonology  Type 2 diabetes Hyperglycemia - secondary to steroids A1c 8.0%. Not on medications for diabetes as outpatient. --increase Lantus to 10 units at bedtime --increaseNovoLogto 13units with meals  --continue sensitive slliding scale NovoLog --Should be discharged on an oral medication and follow-up with PCP closely --Carb modified diet   Essential hypertension-chronic, stable -Continue Norvasc  Restless leg syndrome-chronic, stable -Continue Requip  Generalized weakness-secondary to recent acute illnesses and underlying comorbidities. --PT eval --Home health PT recommended,TOC consulted to arrange   DVT prophylaxis:Lovenox Code Status: Full Code Family Communication:attempted to reach daughter, unsuccessful Disposition Plan:Pending further clinical improvement.May require LTAC if unable to wean down oxygen. Otherwise planwould be fordischarge home with home health PT. Patient currently continues to require hospital care given significant oxygen requirements and  ongoing management as above.  Consultants:  Pulmonology  Procedures:  None  Antimicrobials:  Rocephincompleted  Zithromax completed  Objective: Vitals:  12/21/19 0511 12/21/19 0734 12/21/19 1157 12/21/19 1224  BP:   122/71   Pulse: (!) 108  92   Resp:   18   Temp:   97.6 F (36.4 C)   TempSrc:   Oral   SpO2: 90% 90% 99% 95%  Weight:      Height:        Intake/Output Summary (Last 24 hours) at 12/21/2019 1255 Last data filed at 12/21/2019 0702 Gross per 24 hour  Intake 720 ml  Output 850 ml  Net -130 ml   Filed Weights   2020-01-08 2252  Weight: 71.2 kg    Examination:  General exam: awake, alert, no acute distress HEENT: moist mucus membranes, hearing grossly normal  Respiratory system: diffuse coarse crackles, no wheezes or rhonchi, mildly increased respiratory effort. Cardiovascular system: normal S1/S2, RRR, no JVD, murmurs, rubs, gallops, no pedal edema.   Central nervous system: alert and oriented x3. no gross focal neurologic deficits, normal speech Extremities: moves all, no edema, normal tone Psychiatry: normal mood, congruent affect, judgement and insight appear normal    Data Reviewed: I have personally reviewed following labs and imaging studies  CBC: Recent Labs  Lab 12/19/19 0435 12/20/19 0611 12/21/19 0546  WBC 23.2* 19.8* 17.6*  NEUTROABS 21.7* 18.6* 16.6*  HGB 12.7 11.9* 12.6  HCT 40.1 37.7 40.2  MCV 90.5 91.3 91.4  PLT 133* 118* 086*   Basic Metabolic Panel: Recent Labs  Lab 12/15/19 0511 12/16/19 0508 12/19/19 0435 12/20/19 0611 12/21/19 0546  NA  --   --  146* 143 144  K  --   --  4.2 4.3 4.5  CL  --   --  107 106 104  CO2  --   --  30 30 28   GLUCOSE  --   --  125* 201* 229*  BUN  --   --  41* 41* 40*  CREATININE 0.83 0.97 0.85 0.96 0.96  CALCIUM  --   --  8.6* 8.4* 8.8*  MG  --   --  2.5* 2.6* 2.5*   GFR: Estimated Creatinine Clearance: 50.8 mL/min (by C-G formula based on SCr of 0.96 mg/dL). Liver  Function Tests: Recent Labs  Lab 12/19/19 0435  AST 22  ALT 27  ALKPHOS 104  BILITOT 0.9  PROT 6.4*  ALBUMIN 2.6*   No results for input(s): LIPASE, AMYLASE in the last 168 hours. No results for input(s): AMMONIA in the last 168 hours. Coagulation Profile: No results for input(s): INR, PROTIME in the last 168 hours. Cardiac Enzymes: No results for input(s): CKTOTAL, CKMB, CKMBINDEX, TROPONINI in the last 168 hours. BNP (last 3 results) No results for input(s): PROBNP in the last 8760 hours. HbA1C: No results for input(s): HGBA1C in the last 72 hours. CBG: Recent Labs  Lab 12/20/19 1211 12/20/19 1709 12/20/19 2103 12/21/19 0729 12/21/19 1155  GLUCAP 207* 422* 127* 211* 273*   Lipid Profile: No results for input(s): CHOL, HDL, LDLCALC, TRIG, CHOLHDL, LDLDIRECT in the last 72 hours. Thyroid Function Tests: No results for input(s): TSH, T4TOTAL, FREET4, T3FREE, THYROIDAB in the last 72 hours. Anemia Panel: No results for input(s): VITAMINB12, FOLATE, FERRITIN, TIBC, IRON, RETICCTPCT in the last 72 hours. Sepsis Labs: Recent Labs  Lab 12/19/19 2208 12/20/19 0611 12/21/19 0546  PROCALCITON 0.52 0.64 0.47    Recent Results (from the past 240 hour(s))  Blood Culture (routine x 2)     Status: None   Collection Time: Jan 08, 2020 11:32 PM   Specimen: Right  Antecubital; Blood  Result Value Ref Range Status   Specimen Description RIGHT ANTECUBITAL  Final   Special Requests Blood Culture adequate volume  Final   Culture   Final    NO GROWTH 5 DAYS Performed at [facility], [address] [place], [place] [address]    Report Status 12/18/2019 FINAL  Final  Blood Culture (routine x 2)     Status: None   Collection Time: 12/10/2019 11:33 PM   Specimen: Left Antecubital; Blood  Result Value Ref Range Status   Specimen Description LEFT ANTECUBITAL  Final   Special Requests   Final    Blood Culture results may not be optimal due to an inadequate volume of blood  received in culture bottles   Culture   Final    NO GROWTH 5 DAYS Performed at [facility], [address] [place], [place] [address]    Report Status 12/18/2019 FINAL  Final  Respiratory Panel by PCR     Status: None   Collection Time: 12/14/19  9:05 AM   Specimen: Nasopharyngeal Swab; Respiratory  Result Value Ref Range Status   Adenovirus NOT DETECTED NOT DETECTED Final   Coronavirus 229E NOT DETECTED NOT DETECTED Final    Comment: (NOTE) The Coronavirus on the Respiratory Panel, DOES NOT test for the novel  Coronavirus (2019 nCoV)    Coronavirus HKU1 NOT DETECTED NOT DETECTED Final   Coronavirus NL63 NOT DETECTED NOT DETECTED Final   Coronavirus OC43 NOT DETECTED NOT DETECTED Final   Metapneumovirus NOT DETECTED NOT DETECTED Final   Rhinovirus / Enterovirus NOT DETECTED NOT DETECTED Final   Influenza A NOT DETECTED NOT DETECTED Final   Influenza B NOT DETECTED NOT DETECTED Final   Parainfluenza Virus 1 NOT DETECTED NOT DETECTED Final   Parainfluenza Virus 2 NOT DETECTED NOT DETECTED Final   Parainfluenza Virus 3 NOT DETECTED NOT DETECTED Final   Parainfluenza Virus 4 NOT DETECTED NOT DETECTED Final   Respiratory Syncytial Virus NOT DETECTED NOT DETECTED Final   Bordetella pertussis NOT DETECTED NOT DETECTED Final   Chlamydophila pneumoniae NOT DETECTED NOT DETECTED Final   Mycoplasma pneumoniae NOT DETECTED NOT DETECTED Final    Comment: Performed at [facility], [address], [place], [place] [address]  MRSA PCR Screening     Status: None   Collection Time: 12/15/19  2:39 PM   Specimen: Nasal Mucosa; Nasopharyngeal  Result Value Ref Range Status   MRSA by PCR NEGATIVE NEGATIVE Final    Comment:        The GeneXpert MRSA Assay (FDA approved for NASAL specimens only), is one component of a comprehensive MRSA colonization surveillance program. It is not intended to diagnose MRSA infection nor to guide or monitor treatment for MRSA  infections. Performed at [facility], [address] [place], [place] [address]   Sputum culture     Status: None   Collection Time: 12/16/19  2:30 AM   Specimen: Sputum  Result Value Ref Range Status   Specimen Description SPUTUM  Final   Special Requests NONE  Final   Sputum evaluation   Final    THIS SPECIMEN IS ACCEPTABLE FOR SPUTUM CULTURE Performed at [facility], [address] [place], [place] [address]    Report Status 12/16/2019 FINAL  Final  Culture, respiratory     Status: None   Collection Time: 12/16/19  2:30 AM   Specimen: SPU  Result Value Ref Range Status   Specimen Description  Final    SPUTUM Performed at [facility], [address] [place], [place] [address]    Special Requests   Final    NONE Reflexed from [id number] Performed at [facility], [address] [place], [place] [address]    Gram Stain   Final    RARE WBC PRESENT, PREDOMINANTLY PMN FEW GRAM POSITIVE COCCI IN PAIRS FEW GRAM NEGATIVE RODS    Culture   Final    FEW Consistent with normal respiratory flora. Performed at [facility], [address], [place], [place] [address]    Report Status 12/19/2019 FINAL  Final         Radiology Studies: No results found.      Scheduled Meds: . amLODipine  10 mg Oral Daily  . ascorbic acid  500 mg Oral Daily  . calcium-vitamin D  1 tablet Oral Daily  . enoxaparin (LOVENOX) injection  40 mg Subcutaneous Q24H  . feeding supplement (ENSURE ENLIVE)  237 mL Oral BID BM  . fluticasone  2 spray Each Nare Daily  . fluticasone furoate-vilanterol  1 puff Inhalation Daily   And  . umeclidinium bromide  1 puff Inhalation Daily  . folic acid  1 mg Oral Daily  . furosemide  20 mg Oral Daily  . insulin aspart  0-5 Units Subcutaneous QHS  . insulin aspart  0-9 Units Subcutaneous TID WC  . insulin aspart  9 Units Subcutaneous TID WC  . insulin glargine  6 Units Subcutaneous QHS  . Ipratropium-Albuterol   1 puff Inhalation Q6H  . loratadine  10 mg Oral Daily  . methylPREDNISolone (SOLU-MEDROL) injection  60 mg Intravenous BID  . multivitamin with minerals  1 tablet Oral Daily  . rOPINIRole  0.25 mg Oral QHS   Continuous Infusions: . sodium chloride 20 mL (12/18/19 1006)     LOS: 7 days    Time spent: 30-35 minutes    Pennie Banter, DO [facility]   If 7PM-7AM, please contact night-coverage [email or web address] Password [facility] 12/21/2019, 12:55 PM

## 2019-12-22 LAB — BASIC METABOLIC PANEL
Anion gap: 10 (ref 5–15)
BUN: 47 mg/dL — ABNORMAL HIGH (ref 8–23)
CO2: 28 mmol/L (ref 22–32)
Calcium: 8.6 mg/dL — ABNORMAL LOW (ref 8.9–10.3)
Chloride: 105 mmol/L (ref 98–111)
Creatinine, Ser: 1.02 mg/dL — ABNORMAL HIGH (ref 0.44–1.00)
GFR calc Af Amer: >60 mL/min (ref >60)
GFR calc non Af Amer: 58 mL/min — ABNORMAL LOW (ref >60)
Glucose, Bld: 239 mg/dL — ABNORMAL HIGH (ref 70–99)
Potassium: 4.7 mmol/L (ref 3.5–5.1)
Sodium: 143 mmol/L (ref 135–145)

## 2019-12-22 LAB — CBC WITH DIFFERENTIAL/PLATELET
Abs Immature Granulocytes: 0.08 10*3/uL — ABNORMAL HIGH (ref 0.00–0.07)
Basophils Absolute: 0.1 10*3/uL (ref 0.0–0.1)
Basophils Relative: 0 %
Eosinophils Absolute: 0.3 10*3/uL (ref 0.0–0.5)
Eosinophils Relative: 2 %
HCT: 39.5 % (ref 36.0–46.0)
Hemoglobin: 12.3 g/dL (ref 12.0–15.0)
Immature Granulocytes: 1 %
Lymphocytes Relative: 3 %
Lymphs Abs: 0.5 10*3/uL — ABNORMAL LOW (ref 0.7–4.0)
MCH: 28.7 pg (ref 26.0–34.0)
MCHC: 31.1 g/dL (ref 30.0–36.0)
MCV: 92.3 fL (ref 80.0–100.0)
Monocytes Absolute: 0.5 10*3/uL (ref 0.1–1.0)
Monocytes Relative: 3 %
Neutro Abs: 14.4 10*3/uL — ABNORMAL HIGH (ref 1.7–7.7)
Neutrophils Relative %: 91 %
Platelets: 127 10*3/uL — ABNORMAL LOW (ref 150–400)
RBC: 4.28 MIL/uL (ref 3.87–5.11)
RDW: 13.9 % (ref 11.5–15.5)
WBC: 15.8 10*3/uL — ABNORMAL HIGH (ref 4.0–10.5)
nRBC: 0 % (ref 0.0–0.2)

## 2019-12-22 LAB — GLUCOSE, CAPILLARY
Glucose-Capillary: 212 mg/dL — ABNORMAL HIGH (ref 70–99)
Glucose-Capillary: 217 mg/dL — ABNORMAL HIGH (ref 70–99)
Glucose-Capillary: 367 mg/dL — ABNORMAL HIGH (ref 70–99)
Glucose-Capillary: 156 mg/dL — ABNORMAL HIGH (ref 70–99)
Glucose-Capillary: 216 mg/dL — ABNORMAL HIGH (ref 70–99)

## 2019-12-22 LAB — MAGNESIUM: Magnesium: 2.6 mg/dL — ABNORMAL HIGH (ref 1.7–2.4)

## 2019-12-22 MED ORDER — ACETAMINOPHEN 325 MG PO TABS
650.0000 mg | ORAL_TABLET | ORAL | Status: DC | PRN
  Administered 2019-12-22 – 2020-01-01 (×4): 650 mg via ORAL
  Filled 2019-12-22 (×4): qty 2

## 2019-12-22 MED ORDER — INSULIN ASPART 100 UNIT/ML ~~LOC~~ SOLN
18.0000 [IU] | Freq: Three times a day (TID) | SUBCUTANEOUS | Status: DC
  Administered 2019-12-22 – 2019-12-23 (×3): 18 [IU] via SUBCUTANEOUS
  Filled 2019-12-22 (×4): qty 1

## 2019-12-22 NOTE — Progress Notes (Signed)
PROGRESS NOTE    Marissa Luna  TDD:[id number] DOB: 05-10-1956 DOA: 2020-01-08  PCP: Ronnell Freshwater, NP    LOS - 8   Brief Narrative:  64 y.o.femalewith medical historyofsarcoidosis and COPD with chronic respiratory failure on2L/min homeO2, hypertension and recently hospitalizedfrom 11/30/19 - 12/30/20withCOVID-19pneumonia andacute on chronicrespiratory failure treated with 5 days of remdesivir and Decadron. She presented to the ED on 1/8with a complaintsof increased cough, weakness and shortness of breath.In the ED, afebrile, mildly tachycardic and tachypneic, hypoxic in 80's on room air. Labs notable for leukocytosis 17k, elevated inflammatory markers including D-dimer of 1572, ferritin 645, CRP 41.6. CT chest showed hazy groundglass opacities seen within both lungs consistent with COVID-19 pneumonia,and interval progression of sarcoidosis compared to 2011. She was started on IV antibiotics, dexamethasone andadmitted tohospitalistservice.  Subjective 1/17: Patient seen this morning, states feeling a little better today.  No acute issues reported overnight.  We have been able to wean oxygen down somewhat today, currently at 11 L/min with O2 sat 90%.  Patient denies fevers, chills, chest pain, nausea vomiting or diarrhea  Assessment & Plan:   Principal Problem:   Acute on chronic respiratory failure with hypoxia (HCC) Active Problems:   Sarcoidosis of lung (HCC)   Essential hypertension   Weakness   Healthcare-associated pneumonia   History of COVID-19   Acute respiratory failure (HCC)   Multifocal pneumonia   Type 2 diabetes mellitus without complication, without long-term current use of insulin (HCC)   Acute on chronic respiratory failure with hypoxiain setting of recent Covid-19 pneumonia, admitted 12/26 to 12/30.Completed treatment with 5 days remdesivir and 10 days Decadron.  Had beenusing 2 L/min nocturnaloxygen atbaseline.Increased oxygen  requirements 1/13-1/14, up to 40 L/min HFNC, from8 L/minpreviously. Continue to wean, currently 11 L/min HFNC --Pulmonology following given underlying progressive sarcoidosis --Solu-Medrol to 60 mg twice daily  --Expect she will need a long steroid taper --supplemental oxygen to keep O2 sat > 88% --May need LTAC if unable to wean down oxygen  Bilateral lobar pneumonia In setting of recent COVID-19 pneumonia.  Completed course of Zithromaxand Rocephin.  Sarcoidosis Noted to have progressed since 2011 on CT scan obtained on admission. Appears quite extensive and severe on personal review of CT scan. --Continue steroids as above --Follow with pulmonology  Type 2 diabetes Hyperglycemia - secondary to steroids A1c 8.0%. Not on medications for diabetes as outpatient. --increase Lantus to 10 units at bedtime --increaseNovoLogto13units with meals  --continue sensitive slliding scale NovoLog --Should be discharged on an oral medication and follow-up with PCP closely --Carb modified diet   Essential hypertension-chronic, stable -Continue Norvasc  Restless leg syndrome-chronic, stable -Continue Requip  Generalized weakness-secondary to recent acute illnesses and underlying comorbidities. --PT eval --Home health PT recommended,TOC consulted to arrange   DVT prophylaxis:Lovenox Code Status: Full Code Family Communication:none at bedside Disposition Plan:Pending further clinical improvement.Plan for going home health PT, hopefully won't need LTAC. Patient currently continues to require hospital care given significant oxygen requirements and ongoing management as above.  Consultants:  Pulmonology  Procedures:  None  Antimicrobials:  Rocephincompleted  Zithromax completed   Objective: Vitals:   12/21/19 1157 12/21/19 1224 12/21/19 2046 12/22/19 0625  BP: 122/71  124/70 128/60  Pulse: 92  98 93  Resp: 18  20 20   Temp: 97.6 F  (36.4 C)  97.6 F (36.4 C) 98.9 F (37.2 C)  TempSrc: Oral  Oral   SpO2: 99% 95% 97% 98%  Weight:      Height:  Intake/Output Summary (Last 24 hours) at 12/22/2019 0755 Last data filed at 12/22/2019 5176 Gross per 24 hour  Intake --  Output 700 ml  Net -700 ml   Filed Weights   12/31/2019 2252  Weight: 71.2 kg    Examination:  General exam: awake, alert, no acute distress Respiratory system: better air movement, bilateral crackles, no wheezes or rhonchi, mildly increased respiratory effort. Cardiovascular system: normal S1/S2, RRR, no pedal edema.   Central nervous system: alert and oriented x4. no gross focal neurologic deficits, normal speech Extremities: moves all, no edema, normal tone Psychiatry: normal mood, congruent affect, judgement and insight appear normal   Data Reviewed: I have personally reviewed following labs and imaging studies  CBC: Recent Labs  Lab 12/19/19 0435 12/20/19 0611 12/21/19 0546  WBC 23.2* 19.8* 17.6*  NEUTROABS 21.7* 18.6* 16.6*  HGB 12.7 11.9* 12.6  HCT 40.1 37.7 40.2  MCV 90.5 91.3 91.4  PLT 133* 118* 122*   Basic Metabolic Panel: Recent Labs  Lab 12/16/19 0508 12/19/19 0435 12/20/19 0611 12/21/19 0546  NA  --  146* 143 144  K  --  4.2 4.3 4.5  CL  --  107 106 104  CO2  --  30 30 28   GLUCOSE  --  125* 201* 229*  BUN  --  41* 41* 40*  CREATININE 0.97 0.85 0.96 0.96  CALCIUM  --  8.6* 8.4* 8.8*  MG  --  2.5* 2.6* 2.5*   GFR: Estimated Creatinine Clearance: 50.8 mL/min (by C-G formula based on SCr of 0.96 mg/dL). Liver Function Tests: Recent Labs  Lab 12/19/19 0435  AST 22  ALT 27  ALKPHOS 104  BILITOT 0.9  PROT 6.4*  ALBUMIN 2.6*   No results for input(s): LIPASE, AMYLASE in the last 168 hours. No results for input(s): AMMONIA in the last 168 hours. Coagulation Profile: No results for input(s): INR, PROTIME in the last 168 hours. Cardiac Enzymes: No results for input(s): CKTOTAL, CKMB, CKMBINDEX,  TROPONINI in the last 168 hours. BNP (last 3 results) No results for input(s): PROBNP in the last 8760 hours. HbA1C: No results for input(s): HGBA1C in the last 72 hours. CBG: Recent Labs  Lab 12/21/19 0729 12/21/19 1155 12/21/19 1651 12/21/19 2059 12/22/19 0622  GLUCAP 211* 273* 262* 189* 212*   Lipid Profile: No results for input(s): CHOL, HDL, LDLCALC, TRIG, CHOLHDL, LDLDIRECT in the last 72 hours. Thyroid Function Tests: No results for input(s): TSH, T4TOTAL, FREET4, T3FREE, THYROIDAB in the last 72 hours. Anemia Panel: No results for input(s): VITAMINB12, FOLATE, FERRITIN, TIBC, IRON, RETICCTPCT in the last 72 hours. Sepsis Labs: Recent Labs  Lab 12/19/19 2208 12/20/19 0611 12/21/19 0546  PROCALCITON 0.52 0.64 0.47    Recent Results (from the past 240 hour(s))  Blood Culture (routine x 2)     Status: None   Collection Time: 12/16/2019 11:32 PM   Specimen: Right Antecubital; Blood  Result Value Ref Range Status   Specimen Description RIGHT ANTECUBITAL  Final   Special Requests Blood Culture adequate volume  Final   Culture   Final    NO GROWTH 5 DAYS Performed at [facility], [address], [place], [place]    Report Status 12/18/2019 FINAL  Final  Blood Culture (routine x 2)     Status: None   Collection Time: 12/14/2019 11:33 PM   Specimen: Left Antecubital; Blood  Result Value Ref Range Status   Specimen Description LEFT ANTECUBITAL  Final   Special Requests  Final    Blood Culture results may not be optimal due to an inadequate volume of blood received in culture bottles   Culture   Final    NO GROWTH 5 DAYS Performed at [facility], [address] [place], [place] [address]    Report Status 12/18/2019 FINAL  Final  Respiratory Panel by PCR     Status: None   Collection Time: 12/14/19  9:05 AM   Specimen: Nasopharyngeal Swab; Respiratory  Result Value Ref Range Status   Adenovirus NOT DETECTED NOT DETECTED Final    Coronavirus 229E NOT DETECTED NOT DETECTED Final    Comment: (NOTE) The Coronavirus on the Respiratory Panel, DOES NOT test for the novel  Coronavirus (2019 nCoV)    Coronavirus HKU1 NOT DETECTED NOT DETECTED Final   Coronavirus NL63 NOT DETECTED NOT DETECTED Final   Coronavirus OC43 NOT DETECTED NOT DETECTED Final   Metapneumovirus NOT DETECTED NOT DETECTED Final   Rhinovirus / Enterovirus NOT DETECTED NOT DETECTED Final   Influenza A NOT DETECTED NOT DETECTED Final   Influenza B NOT DETECTED NOT DETECTED Final   Parainfluenza Virus 1 NOT DETECTED NOT DETECTED Final   Parainfluenza Virus 2 NOT DETECTED NOT DETECTED Final   Parainfluenza Virus 3 NOT DETECTED NOT DETECTED Final   Parainfluenza Virus 4 NOT DETECTED NOT DETECTED Final   Respiratory Syncytial Virus NOT DETECTED NOT DETECTED Final   Bordetella pertussis NOT DETECTED NOT DETECTED Final   Chlamydophila pneumoniae NOT DETECTED NOT DETECTED Final   Mycoplasma pneumoniae NOT DETECTED NOT DETECTED Final    Comment: Performed at [facility], [address], [place], [place] [address]  MRSA PCR Screening     Status: None   Collection Time: 12/15/19  2:39 PM   Specimen: Nasal Mucosa; Nasopharyngeal  Result Value Ref Range Status   MRSA by PCR NEGATIVE NEGATIVE Final    Comment:        The GeneXpert MRSA Assay (FDA approved for NASAL specimens only), is one component of a comprehensive MRSA colonization surveillance program. It is not intended to diagnose MRSA infection nor to guide or monitor treatment for MRSA infections. Performed at [facility], [address] [place], [place] [address]   Sputum culture     Status: None   Collection Time: 12/16/19  2:30 AM   Specimen: Sputum  Result Value Ref Range Status   Specimen Description SPUTUM  Final   Special Requests NONE  Final   Sputum evaluation   Final    THIS SPECIMEN IS ACCEPTABLE FOR SPUTUM CULTURE Performed at [facility], [address] [place], [place] [address]    Report Status 12/16/2019 FINAL  Final  Culture, respiratory     Status: None   Collection Time: 12/16/19  2:30 AM   Specimen: SPU  Result Value Ref Range Status   Specimen Description   Final    SPUTUM Performed at [facility], [address] [place], [place] [address]    Special Requests   Final    NONE Reflexed from [phone] Performed at [facility], [address] [place], [place] [address]    Gram Stain   Final    RARE WBC PRESENT, PREDOMINANTLY PMN FEW GRAM POSITIVE COCCI IN PAIRS FEW GRAM NEGATIVE RODS    Culture   Final    FEW Consistent with normal respiratory flora. Performed at [facility], [address], [place], [place] [address]    Report Status  12/19/2019 FINAL  Final         Radiology Studies: No results found.      Scheduled Meds: . amLODipine  10 mg Oral Daily  . ascorbic acid  500 mg Oral Daily  . calcium-vitamin D  1 tablet Oral Daily  . enoxaparin (LOVENOX) injection  40 mg Subcutaneous Q24H  . feeding supplement (ENSURE ENLIVE)  237 mL Oral BID BM  . fluticasone  2 spray Each Nare Daily  . fluticasone furoate-vilanterol  1 puff Inhalation Daily   And  . umeclidinium bromide  1 puff Inhalation Daily  . folic acid  1 mg Oral Daily  . furosemide  20 mg Oral Daily  . insulin aspart  0-5 Units Subcutaneous QHS  . insulin aspart  0-9 Units Subcutaneous TID WC  . insulin aspart  13 Units Subcutaneous TID WC  . insulin glargine  12 Units Subcutaneous QHS  . Ipratropium-Albuterol  1 puff Inhalation Q6H  . loratadine  10 mg Oral Daily  . methylPREDNISolone (SOLU-MEDROL) injection  60 mg Intravenous BID  . multivitamin with minerals  1 tablet Oral Daily  . rOPINIRole  0.25 mg Oral QHS   Continuous Infusions: . sodium chloride 20 mL (12/18/19 1006)     LOS: 8 days    Time spent: 25-30 minutes    Pennie Banter, DO [facility]   If 7PM-7AM, please contact  night-coverage [email or web address] Password [facility] 12/22/2019, 7:55 AM

## 2019-12-22 NOTE — Plan of Care (Signed)
Continuing with plan of care. 

## 2019-12-22 NOTE — Plan of Care (Signed)

## 2019-12-23 LAB — CBC WITH DIFFERENTIAL/PLATELET
Abs Immature Granulocytes: 0.09 10*3/uL — ABNORMAL HIGH (ref 0.00–0.07)
Basophils Absolute: 0 10*3/uL (ref 0.0–0.1)
Basophils Relative: 0 %
Eosinophils Absolute: 0 10*3/uL (ref 0.0–0.5)
Eosinophils Relative: 0 %
HCT: 36.8 % (ref 36.0–46.0)
Hemoglobin: 11.4 g/dL — ABNORMAL LOW (ref 12.0–15.0)
Immature Granulocytes: 1 %
Lymphocytes Relative: 4 %
Lymphs Abs: 0.4 10*3/uL — ABNORMAL LOW (ref 0.7–4.0)
MCH: 28.2 pg (ref 26.0–34.0)
MCHC: 31 g/dL (ref 30.0–36.0)
MCV: 91.1 fL (ref 80.0–100.0)
Monocytes Absolute: 0.5 10*3/uL (ref 0.1–1.0)
Monocytes Relative: 5 %
Neutro Abs: 10.3 10*3/uL — ABNORMAL HIGH (ref 1.7–7.7)
Neutrophils Relative %: 90 %
Platelets: 113 10*3/uL — ABNORMAL LOW (ref 150–400)
RBC: 4.04 MIL/uL (ref 3.87–5.11)
RDW: 13.6 % (ref 11.5–15.5)
WBC: 11.4 10*3/uL — ABNORMAL HIGH (ref 4.0–10.5)
nRBC: 0 % (ref 0.0–0.2)

## 2019-12-23 LAB — GLUCOSE, CAPILLARY
Glucose-Capillary: 197 mg/dL — ABNORMAL HIGH (ref 70–99)
Glucose-Capillary: 303 mg/dL — ABNORMAL HIGH (ref 70–99)
Glucose-Capillary: 133 mg/dL — ABNORMAL HIGH (ref 70–99)
Glucose-Capillary: 149 mg/dL — ABNORMAL HIGH (ref 70–99)

## 2019-12-23 LAB — BASIC METABOLIC PANEL
Anion gap: 10 (ref 5–15)
BUN: 42 mg/dL — ABNORMAL HIGH (ref 8–23)
CO2: 29 mmol/L (ref 22–32)
Calcium: 8.2 mg/dL — ABNORMAL LOW (ref 8.9–10.3)
Chloride: 101 mmol/L (ref 98–111)
Creatinine, Ser: 0.9 mg/dL (ref 0.44–1.00)
GFR calc Af Amer: >60 mL/min (ref >60)
GFR calc non Af Amer: >60 mL/min (ref >60)
Glucose, Bld: 253 mg/dL — ABNORMAL HIGH (ref 70–99)
Potassium: 4.4 mmol/L (ref 3.5–5.1)
Sodium: 140 mmol/L (ref 135–145)

## 2019-12-23 LAB — MAGNESIUM: Magnesium: 2.4 mg/dL (ref 1.7–2.4)

## 2019-12-23 MED ORDER — METHYLPREDNISOLONE SODIUM SUCC 40 MG IJ SOLR
40.0000 mg | Freq: Two times a day (BID) | INTRAMUSCULAR | Status: DC
  Administered 2019-12-23 – 2019-12-25 (×4): 40 mg via INTRAVENOUS
  Filled 2019-12-23 (×4): qty 1

## 2019-12-23 MED ORDER — INSULIN ASPART 100 UNIT/ML ~~LOC~~ SOLN
22.0000 [IU] | Freq: Three times a day (TID) | SUBCUTANEOUS | Status: DC
  Administered 2019-12-23 – 2019-12-24 (×3): 22 [IU] via SUBCUTANEOUS
  Filled 2019-12-23 (×2): qty 1

## 2019-12-23 MED ORDER — METHOCARBAMOL 500 MG PO TABS
1000.0000 mg | ORAL_TABLET | Freq: Three times a day (TID) | ORAL | Status: DC | PRN
  Administered 2019-12-23 – 2020-01-02 (×3): 1000 mg via ORAL
  Filled 2019-12-23 (×4): qty 2

## 2019-12-23 MED ORDER — HYDROCODONE-ACETAMINOPHEN 5-325 MG PO TABS
1.0000 | ORAL_TABLET | Freq: Four times a day (QID) | ORAL | Status: DC | PRN
  Administered 2019-12-24: 1 via ORAL
  Administered 2019-12-24: 2 via ORAL
  Administered 2019-12-25 (×2): 1 via ORAL
  Administered 2019-12-26 – 2019-12-28 (×4): 2 via ORAL
  Administered 2019-12-29 – 2019-12-30 (×2): 1 via ORAL
  Administered 2019-12-30 – 2019-12-31 (×3): 2 via ORAL
  Administered 2020-01-01: 1 via ORAL
  Administered 2020-01-01: 2 via ORAL
  Administered 2020-01-02: 1 via ORAL
  Administered 2020-01-03 – 2020-01-04 (×2): 2 via ORAL
  Filled 2019-12-23 (×3): qty 2
  Filled 2019-12-23: qty 1
  Filled 2019-12-23 (×3): qty 2
  Filled 2019-12-23: qty 1
  Filled 2019-12-23: qty 2
  Filled 2019-12-23: qty 1
  Filled 2019-12-23: qty 2
  Filled 2019-12-23 (×3): qty 1
  Filled 2019-12-23 (×4): qty 2

## 2019-12-23 NOTE — Progress Notes (Signed)
Inpatient Diabetes Program Recommendations  AACE/ADA: New Consensus Statement on Inpatient Glycemic Control   Target Ranges:  Prepandial:   less than 140 mg/dL      Peak postprandial:   less than 180 mg/dL (1-2 hours)      Critically ill patients:  140 - 180 mg/dL   Results for Marissa Luna, Marissa Luna (MRN [id number]) as of 12/23/2019 11:50  Ref. Range 12/22/2019 08:31 12/22/2019 12:14 12/22/2019 17:53 12/22/2019 21:14 12/23/2019 09:41  Glucose-Capillary Latest Ref Range: 70 - 99 mg/dL 549 (H) 826 (H) 415 (H) 216 (H) 197 (H)   Review of Glycemic Control  Current orders for Inpatient glycemic control: Lantus 12 units QHS, Novolog 18 units TID with meals, Novolog 0-9 units TID with meals, Novolog 0-5 units QHS; Solumedrol 60 mg BID  Inpatient Diabetes Program Recommendations:   Insulin - Basal: If steroids are continued as ordered, please consider increasing Lantus to 14 units QHS.  Correction (SSI): Please consider increasing Novolog correction to Novolog 0-15 units TID with meals.  Thanks, Orlando Penner, RN, MSN, CDE Diabetes Coordinator Inpatient Diabetes Program [phone] (Team Pager from 8am to 5pm)

## 2019-12-23 NOTE — Progress Notes (Addendum)
PROGRESS NOTE    Marissa Luna  NOM:[id number] DOB: 1956-02-04 DOA: 12/12/2019  PCP: Carlean Jews, NP    LOS - 9   Brief Narrative:  64 y.o.femalewith medical historyofsarcoidosis and COPD with chronic respiratory failure on2L/min homeO2, hypertension and recently hospitalizedfrom 11/30/19 - 12/30/20withCOVID-19pneumonia andacute on chronicrespiratory failure treated with 5 days of remdesivir and Decadron. She presented to the ED on 1/8with a complaintsof increased cough, weakness and shortness of breath.In the ED, afebrile, mildly tachycardic and tachypneic, hypoxic in 80's on room air. Labs notable for leukocytosis 17k, elevated inflammatory markers including D-dimer of 1572, ferritin 645, CRP 41.6. CT chest showed hazy groundglass opacities seen within both lungs consistent with COVID-19 pneumonia,and interval progression of sarcoidosis compared to 2011. She was started on IV antibiotics, dexamethasone andadmitted tohospitalistservice.  Subjective 1/18: Patient seen up in chair this AM.  Reports back pain, maybe from way she was laying in bed.  States legs are pretty weak.  Breathing stable, little better.  Weaned to 5 L/min today.  No acute events reported.  Assessment & Plan:   Principal Problem:   Acute on chronic respiratory failure with hypoxia (HCC) Active Problems:   Sarcoidosis of lung (HCC)   Essential hypertension   Weakness   Healthcare-associated pneumonia   History of COVID-19   Acute respiratory failure (HCC)   Multifocal pneumonia   Type 2 diabetes mellitus without complication, without long-term current use of insulin (HCC)   Acute on chronic respiratory failure with hypoxiain setting of recent Covid-19 pneumonia, admitted 12/26 to 12/30.Completed treatment with 5 days remdesivir and 10 days Decadron.  Had beenusing 2 L/min nocturnaloxygen atbaseline.Increased oxygen requirements 1/13-1/14, up to 40 L/min HFNC, from8  L/minpreviously.1/18 weaned down to 5 L/min after few days higher dose steroids. Continue to wean, currently 11 L/min HFNC --will reach out to Dr. Park Breed, Pulmonology regarding steroid taper needed, will need long taper --decrease Solu-Medrol to 40 mg twice daily --supplemental oxygen to keep O2 sat > 88%  Bilateral lobar pneumonia In setting of recent COVID-19 pneumonia.  Completed course of Zithromaxand Rocephin.  Sarcoidosis Noted to have progressed since 2011 on CT scan obtained on admission. Appears quite extensive and severe on personal review of CT scan. --Continue steroids as above --Follow with pulmonology  Type 2 diabetes Hyperglycemia - secondary to steroids A1c 8.0%. Not on medications for diabetes as outpatient. --continueLantusto 10units at bedtime --increaseNovoLogto22units with meals  --continue sensitive slliding scale NovoLog --Should be discharged on an oral medication and follow-up with PCP closely --Carb modified diet  --Diabetes coordinator consulted as patient likely to need insulin on discharge given long steroid taper  Back Pain - likely from immobility this admission --heating pad --Norco PRN --Robaxin PRN  Essential hypertension-chronic, stable -Continue Norvasc  Restless leg syndrome-chronic, stable -Continue Requip  Generalized weakness-secondary to recent acute illnesses and underlying comorbidities. --PT eval --Home health PT recommended,TOC consulted to arrange   DVT prophylaxis:Lovenox Code Status: Full Code Family Communication:none at bedside Disposition Plan:Pending further clinical improvement.Plan for going home health PT, hopefully won't need LTAC. Patient currently continues to require hospital care given significant oxygen requirementsand ongoing management as above.  Consultants:  Pulmonology  Procedures:  None  Antimicrobials:  Rocephincompleted  Zithromax  completed   Objective: Vitals:   12/22/19 1514 12/22/19 1830 12/22/19 2019 12/23/19 0451  BP:   126/67 127/77  Pulse:   (!) 107 98  Resp:   (!) 22 18  Temp:   98.2 F (36.8 C) 98 F (36.7  C)  TempSrc:   Oral Oral  SpO2: 95% 95% 94% 97%  Weight:      Height:        Intake/Output Summary (Last 24 hours) at 12/23/2019 0817 Last data filed at 12/23/2019 0454 Gross per 24 hour  Intake --  Output 1700 ml  Net -1700 ml   Filed Weights   12/24/2019 2252  Weight: 71.2 kg    Examination:  General exam: awake, alert, no acute distress HEENT: clear conjunctiva, anicteric sclera, moist mucus membranes, hearing grossly normal  Respiratory system: better air movement, basilar crackles improved, no wheezes or rhonchi, normal respiratory effort. Cardiovascular system: normal S1/S2, RRR, no JVD, no pedal edema.   Central nervous system: alert and oriented x4. no gross focal neurologic deficits, normal speech Extremities: moves all, no edema, normal tone Psychiatry: normal mood, congruent affect, judgement and insight appear normal    Data Reviewed: I have personally reviewed following labs and imaging studies  CBC: Recent Labs  Lab 12/19/19 0435 12/20/19 0611 12/21/19 0546 12/22/19 0637 12/23/19 0638  WBC 23.2* 19.8* 17.6* 15.8* 11.4*  NEUTROABS 21.7* 18.6* 16.6* 14.4* 10.3*  HGB 12.7 11.9* 12.6 12.3 11.4*  HCT 40.1 37.7 40.2 39.5 36.8  MCV 90.5 91.3 91.4 92.3 91.1  PLT 133* 118* 122* 127* 284*   Basic Metabolic Panel: Recent Labs  Lab 12/19/19 0435 12/20/19 0611 12/21/19 0546 12/22/19 0637 12/23/19 0638  NA 146* 143 144 143 140  K 4.2 4.3 4.5 4.7 4.4  CL 107 106 104 105 101  CO2 30 30 28 28 29   GLUCOSE 125* 201* 229* 239* 253*  BUN 41* 41* 40* 47* 42*  CREATININE 0.85 0.96 0.96 1.02* 0.90  CALCIUM 8.6* 8.4* 8.8* 8.6* 8.2*  MG 2.5* 2.6* 2.5* 2.6* 2.4   GFR: Estimated Creatinine Clearance: 54.2 mL/min (by C-G formula based on SCr of 0.9 mg/dL). Liver Function  Tests: Recent Labs  Lab 12/19/19 0435  AST 22  ALT 27  ALKPHOS 104  BILITOT 0.9  PROT 6.4*  ALBUMIN 2.6*   No results for input(s): LIPASE, AMYLASE in the last 168 hours. No results for input(s): AMMONIA in the last 168 hours. Coagulation Profile: No results for input(s): INR, PROTIME in the last 168 hours. Cardiac Enzymes: No results for input(s): CKTOTAL, CKMB, CKMBINDEX, TROPONINI in the last 168 hours. BNP (last 3 results) No results for input(s): PROBNP in the last 8760 hours. HbA1C: No results for input(s): HGBA1C in the last 72 hours. CBG: Recent Labs  Lab 12/22/19 0622 12/22/19 0831 12/22/19 1214 12/22/19 1753 12/22/19 2114  GLUCAP 212* 217* 367* 156* 216*   Lipid Profile: No results for input(s): CHOL, HDL, LDLCALC, TRIG, CHOLHDL, LDLDIRECT in the last 72 hours. Thyroid Function Tests: No results for input(s): TSH, T4TOTAL, FREET4, T3FREE, THYROIDAB in the last 72 hours. Anemia Panel: No results for input(s): VITAMINB12, FOLATE, FERRITIN, TIBC, IRON, RETICCTPCT in the last 72 hours. Sepsis Labs: Recent Labs  Lab 12/19/19 2208 12/20/19 0611 12/21/19 0546  PROCALCITON 0.52 0.64 0.47    Recent Results (from the past 240 hour(s))  Blood Culture (routine x 2)     Status: None   Collection Time: 01/04/2020 11:32 PM   Specimen: Right Antecubital; Blood  Result Value Ref Range Status   Specimen Description RIGHT ANTECUBITAL  Final   Special Requests Blood Culture adequate volume  Final   Culture   Final    NO GROWTH 5 DAYS Performed at [facility], [address] [place],  [place] [address]    Report Status 12/18/2019 FINAL  Final  Blood Culture (routine x 2)     Status: None   Collection Time: 01/01/2020 11:33 PM   Specimen: Left Antecubital; Blood  Result Value Ref Range Status   Specimen Description LEFT ANTECUBITAL  Final   Special Requests   Final    Blood Culture results may not be optimal due to an inadequate volume of blood received in  culture bottles   Culture   Final    NO GROWTH 5 DAYS Performed at [facility], [address] [place], [place] [address]    Report Status 12/18/2019 FINAL  Final  Respiratory Panel by PCR     Status: None   Collection Time: 12/14/19  9:05 AM   Specimen: Nasopharyngeal Swab; Respiratory  Result Value Ref Range Status   Adenovirus NOT DETECTED NOT DETECTED Final   Coronavirus 229E NOT DETECTED NOT DETECTED Final    Comment: (NOTE) The Coronavirus on the Respiratory Panel, DOES NOT test for the novel  Coronavirus (2019 nCoV)    Coronavirus HKU1 NOT DETECTED NOT DETECTED Final   Coronavirus NL63 NOT DETECTED NOT DETECTED Final   Coronavirus OC43 NOT DETECTED NOT DETECTED Final   Metapneumovirus NOT DETECTED NOT DETECTED Final   Rhinovirus / Enterovirus NOT DETECTED NOT DETECTED Final   Influenza A NOT DETECTED NOT DETECTED Final   Influenza B NOT DETECTED NOT DETECTED Final   Parainfluenza Virus 1 NOT DETECTED NOT DETECTED Final   Parainfluenza Virus 2 NOT DETECTED NOT DETECTED Final   Parainfluenza Virus 3 NOT DETECTED NOT DETECTED Final   Parainfluenza Virus 4 NOT DETECTED NOT DETECTED Final   Respiratory Syncytial Virus NOT DETECTED NOT DETECTED Final   Bordetella pertussis NOT DETECTED NOT DETECTED Final   Chlamydophila pneumoniae NOT DETECTED NOT DETECTED Final   Mycoplasma pneumoniae NOT DETECTED NOT DETECTED Final    Comment: Performed at [facility], [address] [place], [place] [address]  MRSA PCR Screening     Status: None   Collection Time: 12/15/19  2:39 PM   Specimen: Nasal Mucosa; Nasopharyngeal  Result Value Ref Range Status   MRSA by PCR NEGATIVE NEGATIVE Final    Comment:        The GeneXpert MRSA Assay (FDA approved for NASAL specimens only), is one component of a comprehensive MRSA colonization surveillance program. It is not intended to diagnose MRSA infection nor to guide or monitor treatment for MRSA infections. Performed at  [facility], [address] [place], [place] [address]   Sputum culture     Status: None   Collection Time: 12/16/19  2:30 AM   Specimen: Sputum  Result Value Ref Range Status   Specimen Description SPUTUM  Final   Special Requests NONE  Final   Sputum evaluation   Final    THIS SPECIMEN IS ACCEPTABLE FOR SPUTUM CULTURE Performed at [facility], [address], [place], [place] [address]    Report Status 12/16/2019 FINAL  Final  Culture, respiratory     Status: None   Collection Time: 12/16/19  2:30 AM   Specimen: SPU  Result Value Ref Range Status   Specimen Description   Final    SPUTUM Performed at [facility], [address] [place], [place] [address]    Special Requests   Final    NONE Reflexed from [id number] Performed at [facility], [address] [place], [place] [address]    Gram  Stain   Final    RARE WBC PRESENT, PREDOMINANTLY PMN FEW GRAM POSITIVE COCCI IN PAIRS FEW GRAM NEGATIVE RODS    Culture   Final    FEW Consistent with normal respiratory flora. Performed at [facility], [address], [place], [place] [address]    Report Status 12/19/2019 FINAL  Final         Radiology Studies: No results found.      Scheduled Meds: . amLODipine  10 mg Oral Daily  . ascorbic acid  500 mg Oral Daily  . calcium-vitamin D  1 tablet Oral Daily  . enoxaparin (LOVENOX) injection  40 mg Subcutaneous Q24H  . feeding supplement (ENSURE ENLIVE)  237 mL Oral BID BM  . fluticasone  2 spray Each Nare Daily  . fluticasone furoate-vilanterol  1 puff Inhalation Daily   And  . umeclidinium bromide  1 puff Inhalation Daily  . folic acid  1 mg Oral Daily  . furosemide  20 mg Oral Daily  . insulin aspart  0-5 Units Subcutaneous QHS  . insulin aspart  0-9 Units Subcutaneous TID WC  . insulin aspart  18 Units Subcutaneous TID WC  . insulin glargine  12 Units Subcutaneous QHS  . Ipratropium-Albuterol  1 puff Inhalation Q6H   . loratadine  10 mg Oral Daily  . methylPREDNISolone (SOLU-MEDROL) injection  60 mg Intravenous BID  . multivitamin with minerals  1 tablet Oral Daily  . rOPINIRole  0.25 mg Oral QHS   Continuous Infusions: . sodium chloride 20 mL (12/18/19 1006)     LOS: 9 days    Time spent: 25-30 minutes    Pennie Banter, DO [facility]   If 7PM-7AM, please contact night-coverage [email or web address] 12/23/2019, 8:17 AM

## 2019-12-23 NOTE — Progress Notes (Signed)
Occupational Therapy Treatment Patient Details Name: Marissa Luna MRN: [id number] DOB: 20-Apr-1956 Today's Date: 12/23/2019    History of present illness Pt admitted for acute on chronic respiratory failure with hypoxia secondary to Covid. She complains of cough, weakness, and SOB symptoms. History includes sarcodosis, COPD, CRD on 2L of O2 chronic, HTN and recent hospital stay for covid 12/26- 12/30   OT comments  Pt seen for OT tx this date to f/u re: safety with ADLs/ADL mobility. Extended time requires for most aspects of treatment as pt does become SOB/Desat with activity requiring seated rest break and recover time (see precaution section below for extended detail). Pt requires MIN A for sit to stand t/f and MIN/MOD A for stand pivot (pt does c/o L hip pain which she feels interferes with her transfers somewhat this date). Pt able to participate in seated grooming with some desaturation (to 88%) but quickly recovers with seated rest, and requires MIN A with lateral lean technique to don socks. Anticipate pt still most appropriate to d/c home with HHOT and supv for OOB activity for fall prevention (at least initially).    Follow Up Recommendations  Home health OT;Supervision - Intermittent(supv for OOB)    Equipment Recommendations  Tub/shower seat;Other (comment)(recher, handheld shower head)    Recommendations for Other Services      Precautions / Restrictions Precautions Precautions: Fall Precaution Comments: watch O2, on 8L HFNC this date, desat to 86% with t/f to chair, 88% after ~30 seconds sitting, 89% after 1 min sitting. Bumped to 9L temporarily and recovers to >90%. Returned to 8L and maintained while sitting in recliner. Restrictions Weight Bearing Restrictions: No       Mobility Bed Mobility Overal bed mobility: Needs Assistance Bed Mobility: Supine to Sit     Supine to sit: Supervision;HOB elevated        Transfers Overall transfer level: Needs  assistance Equipment used: Rolling walker (2 wheeled) Transfers: Sit to/from Stand Sit to Stand: Min assist              Balance Overall balance assessment: Needs assistance Sitting-balance support: Feet supported Sitting balance-Leahy Scale: Good     Standing balance support: Bilateral upper extremity supported Standing balance-Leahy Scale: Fair Standing balance comment: requires CGA for static standing, MIN A for fxl activity/fxl mobility and requires UE support through RW.                           ADL either performed or assessed with clinical judgement   ADL Overall ADL's : Needs assistance/impaired     Grooming: Set up;Sitting Grooming Details (indicate cue type and reason): slight desat to ~88% with seated activity, recovers within ~10 secs to >90%, questionable pleth/accuracy.             Lower Body Dressing: Minimal assistance;Sitting/lateral leans Lower Body Dressing Details (indicate cue type and reason): to thread socks, extended time required Toilet Transfer: Minimal assistance;Moderate assistance;Stand-pivot;BSC           Functional mobility during ADLs: Minimal assistance;Moderate assistance;Rolling walker(pt c/o some pain in L hip, requires extended time for weight shifting to take 2-3 small shuffle steps from bed to chair adjacent.)       Vision Patient Visual Report: No change from baseline     Perception     Praxis      Cognition Arousal/Alertness: Awake/alert Behavior During Therapy: WFL for tasks assessed/performed Overall Cognitive Status: Within Functional Limits for tasks  assessed                                          Exercises Other Exercises Other Exercises: OT faciltiates education with pt re: PLB techniques. Pt demos good understanding. OT also reinforces pt understanding for use of incentive spirometer and PEP device use. Pt verbalized and demo'ed understanding. Other Exercises: OT facilitates  education with pt re: importance of OOB activity for lung health/prevention of further complication of PNA/skin issues from extended time in bed. Pt verbalized understanding.   Shoulder Instructions       General Comments      Pertinent Vitals/ Pain       Pain Assessment: 0-10 Pain Score: 5  Pain Location: L hip Pain Descriptors / Indicators: Aching Pain Intervention(s): Monitored during session;Repositioned;Patient requesting pain meds-RN notified  Home Living                                          Prior Functioning/Environment              Frequency  Min 1X/week        Progress Toward Goals  OT Goals(current goals can now be found in the care plan section)  Progress towards OT goals: Progressing toward goals  Acute Rehab OT Goals Patient Stated Goal: to get stronger and breathe better OT Goal Formulation: With patient Time For Goal Achievement: 12/30/19 Potential to Achieve Goals: Good  Plan Discharge plan remains appropriate    Co-evaluation                 AM-PAC OT "6 Clicks" Daily Activity     Outcome Measure   Help from another person eating meals?: None Help from another person taking care of personal grooming?: None Help from another person toileting, which includes using toliet, bedpan, or urinal?: A Little Help from another person bathing (including washing, rinsing, drying)?: A Little Help from another person to put on and taking off regular upper body clothing?: None Help from another person to put on and taking off regular lower body clothing?: A Little 6 Click Score: 21    End of Session Equipment Utilized During Treatment: Gait belt;Rolling walker  OT Visit Diagnosis: Other abnormalities of gait and mobility (R26.89);Muscle weakness (generalized) (M62.81)   Activity Tolerance Patient tolerated treatment well   Patient Left in chair;with call bell/phone within reach;with chair alarm set   Nurse Communication  Mobility status;Patient requests pain meds        Time: 6010-9323 OT Time Calculation (min): 42 min  Charges: OT General Charges $OT Visit: 1 Visit OT Treatments $Self Care/Home Management : 23-37 mins $Therapeutic Activity: 8-22 mins  Gerrianne Scale, MS, OTR/L ascom [phone] 12/23/19, 12:00 PM

## 2019-12-24 LAB — GLUCOSE, CAPILLARY
Glucose-Capillary: 143 mg/dL — ABNORMAL HIGH (ref 70–99)
Glucose-Capillary: 382 mg/dL — ABNORMAL HIGH (ref 70–99)
Glucose-Capillary: 109 mg/dL — ABNORMAL HIGH (ref 70–99)
Glucose-Capillary: 52 mg/dL — ABNORMAL LOW (ref 70–99)
Glucose-Capillary: 107 mg/dL — ABNORMAL HIGH (ref 70–99)

## 2019-12-24 MED ORDER — INSULIN STARTER KIT- SYRINGES (ENGLISH)
1.0000 | Freq: Once | Status: AC
  Administered 2019-12-24: 1
  Filled 2019-12-24: qty 1

## 2019-12-24 NOTE — Progress Notes (Signed)
Physical Therapy Treatment Patient Details Name: Marissa Luna MRN: [id number] DOB: Nov 15, 1956 Today's Date: 12/24/2019    History of Present Illness Pt admitted for acute on chronic respiratory failure with hypoxia secondary to Covid. She complains of cough, weakness, and SOB symptoms. History includes sarcodosis, COPD, CRD on 2L of O2 chronic, HTN and recent hospital stay for covid 12/26- 12/30    PT Comments    Pt in recliner, initially declines session due to Left hip pain and general fatigue but agrees with mod encouragement.  Stood with min guard to RW but only stands briefly and declines gait due to pain and fatigue.  Discussed discharge plan.  Pt dives with daughter but stated she works during the day and is not available to help.  She stated she does not feel comfortable at this time returning home until her leg and mobility improve.   Pt is open to further therapy at SNF if available and given limitations it would be appropriate for her upon discharge unless mobility  improves.   Follow Up Recommendations  SNF     Equipment Recommendations  Rolling walker with 5" wheels    Recommendations for Other Services       Precautions / Restrictions Precautions Precautions: Fall Restrictions Weight Bearing Restrictions: No    Mobility  Bed Mobility               General bed mobility comments: in recliner before and after session  Transfers Overall transfer level: Needs assistance Equipment used: Rolling walker (2 wheeled) Transfers: Sit to/from Stand Sit to Stand: Min assist            Ambulation/Gait             General Gait Details: refused gait due to LEft hip pain   Stairs             Wheelchair Mobility    Modified Rankin (Stroke Patients Only)       Balance Overall balance assessment: Needs assistance Sitting-balance support: Feet supported Sitting balance-Leahy Scale: Good     Standing balance support: Bilateral upper extremity  supported Standing balance-Leahy Scale: Fair                              Cognition Arousal/Alertness: Awake/alert Behavior During Therapy: WFL for tasks assessed/performed Overall Cognitive Status: Within Functional Limits for tasks assessed                                        Exercises      General Comments        Pertinent Vitals/Pain Pain Assessment: 0-10 Pain Score: 6  Pain Location: L hip Pain Descriptors / Indicators: Aching Pain Intervention(s): Monitored during session;Limited activity within patient's tolerance    Home Living                      Prior Function            PT Goals (current goals can now be found in the care plan section) Progress towards PT goals: Not progressing toward goals - comment    Frequency    Min 2X/week      PT Plan Current plan remains appropriate    Co-evaluation              AM-PAC PT "6 Clicks"  Mobility   Outcome Measure  Help needed turning from your back to your side while in a flat bed without using bedrails?: A Little Help needed moving from lying on your back to sitting on the side of a flat bed without using bedrails?: A Little Help needed moving to and from a bed to a chair (including a wheelchair)?: A Little Help needed standing up from a chair using your arms (e.g., wheelchair or bedside chair)?: A Little Help needed to walk in hospital room?: A Lot Help needed climbing 3-5 steps with a railing? : Total 6 Click Score: 15    End of Session Equipment Utilized During Treatment: Oxygen Activity Tolerance: Patient tolerated treatment well Patient left: in chair;with call bell/phone within reach;with chair alarm set         Time: 1432-1500 PT Time Calculation (min) (ACUTE ONLY): 28 min  Charges:  $Therapeutic Activity: 23-37 mins                    Danielle Dess, PTA 12/24/19, 3:12 PM

## 2019-12-24 NOTE — TOC Progression Note (Signed)
Transition of Care [facility]) - Progression Note    Patient Details  Name: Marissa Luna MRN: [id number] Date of Birth: 1956-08-11  [facility] CM/SW Contact  Chapman Fitch, RN Phone Number: 12/24/2019, 5:01 PM  Clinical Narrative:    Patient agreeable for bed search PASRR obtained Fl2 sent for signature Bed search initiated    Expected Discharge Plan: Home w Home Health Services    Expected Discharge Plan and Services Expected Discharge Plan: Home w Home Health Services   Discharge Planning Services: CM Consult Post Acute Care Choice: Home Health, Durable Medical Equipment Living arrangements for the past 2 months: Single Family Home Expected Discharge Date: 12/19/19               DME Arranged: Dan Humphreys rolling DME Agency: [employer]       HH Arranged: RN, PT HH Agency: [facility] Date HH Agency Contacted: 12/17/19   [facility]ative [facility] with at [facility] Agency: Barbara Cower   Social Determinants of Health (SDOH) Interventions    Readmission Risk Interventions No flowsheet data found.

## 2019-12-24 NOTE — Progress Notes (Addendum)
Inpatient Diabetes Program Recommendations  AACE/ADA: New Consensus Statement on Inpatient Glycemic Control  Target Ranges:  Prepandial:   less than 140 mg/dL      Peak postprandial:   less than 180 mg/dL (1-2 hours)      Critically ill patients:  140 - 180 mg/dL   Results for Marissa Luna, Marissa Luna (MRN [id number]) as of 12/24/2019 10:50  Ref. Range 12/23/2019 09:41 12/23/2019 12:10 12/23/2019 17:19 12/23/2019 21:04 12/24/2019 07:37  Glucose-Capillary Latest Ref Range: 70 - 99 mg/dL 197 (H) 303 (H) 133 (H) 149 (H) 143 (H)  Results for Marissa Luna, Marissa Luna (MRN [id number]) as of 12/24/2019 10:50  Ref. Range 02/22/2018 09:29 12/14/2019 03:44  Hemoglobin A1C Latest Ref Range: 4.8 - 5.6 % 6.6 8.0 (H)   Review of Glycemic Control  Current orders for Inpatient glycemic control: Lantus 12 units QHS, Novolog 22 units TID with meals, Novolog 0-9 units TID with meals, Novolog 0-5 units QHS; Solumedrol 40 mg BID  NOTE: Noted consult for Diabetes Coordinator. Patient newly dx with DM this admission. Inpatient Diabetes Coordinator spoke with patient on 12/17/19 and another Coordinator followed up and spoke with patient on 12/20/19. MD notes patient will likely discharge on insulin due to long taper of steroids due to COVID. Ordered insulin starter kit for teaching and patient education by bedside RNs. Will plan to call patient again today to discuss insulin and how it may need to be adjusted if steroids are tapered and she experiences any hypoglycemia.  Addendum 12/24/19-Spoke with patient over the phone regarding DM, insulin, and steroids.  Explained that MD notes she will be discharged on steroids over a long taper and she will be discharged on insulin. Reviewed how steroids impact glycemic control and explained that while she is taking the steroids she will need to take insulin at home. Patient has already been given the insulin starter kit and RN has instructed on insulin injections and allowed patient to self inject. Patient  states she felt "Okay" with self injecting and stated "I still don't like it. However, I am willing to do whatever I need to do." Discussed Lantus and Novolog insulin as they are currently ordered and explained how each insulin works. Discussed insulin storage, insulin injection rotation, and how insulin needs will likely change once she is able to stop steroids. Encouraged patient to check glucose 4 times a day and to keep a log of glucose readings and insulin taken. Encouraged patient to follow up with PCP regarding DM control and to reach out to her PCP if she experiences any issues with hypoglycemia as she will likely need to have insulin dosages decreased if she has any hypoglycemia. Reviewed hypoglycemia along with proper treatment. Explained to patient that given her A1C was 8%, once she is able to stop the steroids, she may be able to take oral DM medications for DM; just depends on how glucose trends after steroids are completed. Informed patient that RNs will be continuing to work with her on insulin administration and asking her to self administer insulin.  Patient verbalized understanding of information and reports she has no questions at this time.  Thanks, Barnie Alderman, RN, MSN, CDE Diabetes Coordinator Inpatient Diabetes Program [phone] (Team Pager from 8am to 5pm)

## 2019-12-24 NOTE — NC FL2 (Signed)
[place] LEVEL OF CARE SCREENING TOOL     IDENTIFICATION  Patient Name: Marissa Luna Birthdate: 1956/06/25 Sex: female Admission Date (Current Location): 30-Dec-2019  [facility] and [place] Number:  Engineering geologist and Address:         Provider Number: [id number]  Attending Physician Name and Address:  Ezekiel Slocumb, DO  Relative Name and Phone Number:       Current Level of Care: Hospital Recommended Level of Care: [place] Prior Approval Number:    Date Approved/Denied:   PASRR Number: [id number]  Discharge Plan: SNF    Current Diagnoses: Patient Active Problem List   Diagnosis Date Noted  . Type 2 diabetes mellitus without complication, without long-term current use of insulin ([place])   . Acute on chronic respiratory failure with hypoxia ([place]) 12/14/2019  . Healthcare-associated pneumonia 12/14/2019  . History of COVID-19 12/14/2019  . Acute respiratory failure ([place]) 12/14/2019  . Multifocal pneumonia   . Frequent urination   . Elevated glucose   . COVID-19 11/30/2019  . Unsatisfactory cervical Papanicolaou smear 10/28/2019  . Pain in both lower extremities 06/30/2019  . Restless leg syndrome 12/20/2018  . Oxygen dependent 10/17/2018  . Weakness 10/17/2018  . Abnormal weight gain 10/17/2018  . Sarcoidosis   . Need for prophylactic vaccination against Streptococcus pneumoniae (pneumococcus) 07/11/2018  . Routine cervical smear 03/25/2018  . Dysuria 03/25/2018  . Chronic respiratory failure with hypoxia ([place]) 03/13/2018  . Acute bronchitis with COPD ([place]) 02/22/2018  . Lymphocytosis 02/22/2018  . Abnormal kidney function 02/22/2018  . Sepsis ([place]) 01/11/2018  . Community acquired pneumonia of left lung 01/11/2018  . Influenza A 01/11/2018  . Sarcoidosis of lung ([place]) 01/08/2018  . Hypoxia 01/08/2018  . Allergic rhinitis, unspecified 01/08/2018  . Essential hypertension 01/08/2018  . 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  SOB (shortness of breath) 01/08/2018    Orientation RESPIRATION BLADDER Height & Weight     Self, Time, Situation, Place  O2(4L [place]) Continent Weight: 71.2 kg Height:  4\' 11"  (149.9 cm)  BEHAVIORAL SYMPTOMS/MOOD NEUROLOGICAL BOWEL NUTRITION STATUS      Continent Diet(Carb mod)  AMBULATORY STATUS COMMUNICATION OF NEEDS Skin   Extensive Assist Verbally Normal                       Personal Care Assistance Level of Assistance              Functional Limitations Info             SPECIAL CARE FACTORS FREQUENCY  PT (By licensed PT), OT (By licensed OT)                    Contractures Contractures Info: Not present    Additional Factors Info  Code Status, Allergies, Isolation Precautions Code Status Info: Full Allergies Info: NKDA     Isolation Precautions Info: covid positive 24 days ago     Current Medications (12/24/2019):  This is the current hospital active medication list Current Facility-Administered Medications  Medication Dose Route Frequency Provider Last Rate Last Admin  . 0.9 %  sodium chloride infusion   Intravenous PRN Loletha Grayer, MD 5 mL/hr at 12/18/19 1006 20 mL at 12/18/19 1006  . acetaminophen (TYLENOL) tablet 650 mg  650 mg Oral Q4H PRN Sharion Settler, NP   650 mg at 12/23/19 1747  . amLODipine (NORVASC) tablet 10 mg  10 mg Oral Daily Loletha Grayer, MD  10 mg at 12/24/19 0956  . ascorbic acid (VITAMIN C) tablet 500 mg  500 mg Oral Daily Alford Highland, MD   500 mg at 12/24/19 0956  . calcium-vitamin D (OSCAL WITH D) 500-200 MG-UNIT per tablet 1 tablet  1 tablet Oral Daily Alford Highland, MD   1 tablet at 12/24/19 0957  . enoxaparin (LOVENOX) injection 40 mg  40 mg Subcutaneous Q24H Alford Highland, MD   40 mg at 12/24/19 1653  . feeding supplement (ENSURE ENLIVE) (ENSURE ENLIVE) liquid 237 mL  237 mL Oral BID BM Alford Highland, MD   237 mL at 12/24/19 1653  . fluticasone (FLONASE) 50 MCG/ACT nasal spray 2 spray  2 spray Each  Nare Daily Alford Highland, MD   2 spray at 12/24/19 0958  . fluticasone furoate-vilanterol (BREO ELLIPTA) 100-25 MCG/INH 1 puff  1 puff Inhalation Daily Alford Highland, MD   1 puff at 12/24/19 0958  . folic acid (FOLVITE) tablet 1 mg  1 mg Oral Daily Alford Highland, MD   1 mg at 12/24/19 0955  . furosemide (LASIX) tablet 20 mg  20 mg Oral Daily Alford Highland, MD   20 mg at 12/24/19 0955  . guaiFENesin-dextromethorphan (ROBITUSSIN DM) 100-10 MG/5ML syrup 10 mL  10 mL Oral Q4H PRN Alford Highland, MD   10 mL at 12/23/19 1033  . HYDROcodone-acetaminophen (NORCO/VICODIN) 5-325 MG per tablet 1-2 tablet  1-2 tablet Oral Q6H PRN Esaw Grandchild A, DO      . insulin aspart (novoLOG) injection 0-5 Units  0-5 Units Subcutaneous QHS Alford Highland, MD   2 Units at 12/22/19 2123  . insulin aspart (novoLOG) injection 0-9 Units  0-9 Units Subcutaneous TID WC Alford Highland, MD   9 Units at 12/24/19 1249  . insulin aspart (novoLOG) injection 22 Units  22 Units Subcutaneous TID WC Esaw Grandchild A, DO   22 Units at 12/24/19 1248  . insulin glargine (LANTUS) injection 12 Units  12 Units Subcutaneous QHS Esaw Grandchild A, DO   12 Units at 12/23/19 2251  . Ipratropium-Albuterol (COMBIVENT) respimat 1 puff  1 puff Inhalation Q6H Alford Highland, MD   1 puff at 12/24/19 1249  . loratadine (CLARITIN) tablet 10 mg  10 mg Oral Daily Alford Highland, MD   10 mg at 12/24/19 0956  . methocarbamol (ROBAXIN) tablet 1,000 mg  1,000 mg Oral Q8H PRN Esaw Grandchild A, DO   1,000 mg at 12/23/19 1747  . methylPREDNISolone sodium succinate (SOLU-MEDROL) 40 mg/mL injection 40 mg  40 mg Intravenous BID Esaw Grandchild A, DO   40 mg at 12/24/19 0954  . multivitamin with minerals tablet 1 tablet  1 tablet Oral Daily Alford Highland, MD   1 tablet at 12/24/19 0956  . rOPINIRole (REQUIP) tablet 0.25 mg  0.25 mg Oral QHS Alford Highland, MD   0.25 mg at 12/23/19 2251  . sodium chloride (OCEAN) 0.65 % nasal spray 1 spray   1 spray Each Nare PRN Alford Highland, MD         Discharge Medications: Please see discharge summary for a list of discharge medications.  Relevant Imaging Results:  Relevant Lab Results:   Additional Information ss [id number]  Chapman Fitch, RN

## 2019-12-24 NOTE — Progress Notes (Signed)
Nutrition Follow Up Note   DOCUMENTATION CODES:   Obesity unspecified  INTERVENTION:   - Ensure Enlive po BID, each supplement provides 350 kcal and 20 grams of protein  - MVI with minerals daily  NUTRITION DIAGNOSIS:   Increased nutrient needs related to acute illness, catabolic illness (COVID-19) as evidenced by estimated needs.  GOAL:   Patient will meet greater than or equal to 90% of their needs -progressing   MONITOR:   PO intake, Supplement acceptance, Labs, Weight trends  ASSESSMENT:   64 year old female who presented to the ED on 1/08 with increased cough, weakness, SOB. Pt diagnosed with COVID-19 on 11/30/19 and was hospitalized from 12/26-12/30/20 with pneumonia and respiratory failure secondary to COVID-19. PMH of COPD with chronic respiratory failure on home oxygen, HTN, sarcoidosis.   Pt continues to have good appetite and oral intake; pt reports that she is eating 100% of her meals and drinking her Ensure. No new weight since 1/8; will re-request weekly weights.   Medications reviewed and include: vitamin C, Oscal with D, lovenox, folic acid, Lasix, insulin, solu-medrol, MVI  Labs reviewed: cbgs- 303, 133, 149, 143, 382 x 24 hrs  Diet Order:   Diet Order            Diet - low sodium heart healthy        Diet Carb Modified Fluid consistency: Thin; Room service appropriate? Yes  Diet effective now             EDUCATION NEEDS:   Education needs have been addressed  Skin:  Skin Assessment: Reviewed RN Assessment  Last BM:  1/18- TYPE 2  Height:   Ht Readings from Last 1 Encounters:  12/14/2019 4\' 11"  (1.499 m)    Weight:   Wt Readings from Last 1 Encounters:  12/28/2019 71.2 kg    Ideal Body Weight:  44.7 kg  BMI:  Body mass index is 31.71 kg/m.  Estimated Nutritional Needs:   Kcal:  1700-1900  Protein:  85-100 grams  Fluid:  >/= 1.7 L  02/10/20 MS, RD, LDN Pager #- [phone] Office#- [phone] After Hours Pager:  [phone]

## 2019-12-24 NOTE — Progress Notes (Addendum)
PROGRESS NOTE    Marissa Luna  MEQ:[id number] DOB: 07/06/1956 DOA: 01/05/2020  PCP: Ronnell Freshwater, NP    LOS - 10   Brief Narrative:  64 y.o.femalewith medical historyofsarcoidosis and COPD with chronic respiratory failure on2L/min homeO2, hypertension and recently hospitalizedfrom 11/30/19 - 12/30/20withCOVID-19pneumonia andacute on chronicrespiratory failure treated with 5 days of remdesivir and Decadron. She presented to the ED on 1/8with a complaintsof increased cough, weakness and shortness of breath.In the ED, afebrile, mildly tachycardic and tachypneic, hypoxic in 80's on room air. Labs notable for leukocytosis 17k, elevated inflammatory markers including D-dimer of 1572, ferritin 645, CRP 41.6. CT chest showed hazy groundglass opacities seen within both lungs consistent with COVID-19 pneumonia,and interval progression of sarcoidosis compared to 2011. She was started on IV antibiotics, dexamethasone andadmitted tohospitalistservice.  Subjective 1/19: Patient up in chair when seen this morning.  No acute events reported overnight.  States she feels okay today.  Oxygen was able to be weaned down to 4 L/min, and she has not had any worsening shortness of breath.  Agreeable to rehab.  No fevers or chills, chest pain, nausea vomiting or other complaints.  Assessment & Plan:   Principal Problem:   Acute on chronic respiratory failure with hypoxia (HCC) Active Problems:   Sarcoidosis of lung (HCC)   Essential hypertension   Weakness   Healthcare-associated pneumonia   History of COVID-19   Acute respiratory failure (HCC)   Multifocal pneumonia   Type 2 diabetes mellitus without complication, without long-term current use of insulin (HCC)  Acute on chronic respiratory failure with hypoxiain setting of recent Covid-19 pneumonia, admitted 12/26 to 12/30.Completed treatment with 5 days remdesivir and 10 days Decadron.Hadbeenusing 2 L/min nocturnaloxygen  atbaseline.Increased oxygen requirements 1/13-1/14, up to 40 L/min HFNC, from8 L/minpreviously.On 1/18 able to be weaned down to 5 L/min (after few days higher dose steroids).  Now at 4 L/min today. --continue Solu-Medrol to 40 mg twice daily --supplemental oxygen to keep O2 sat >88% --continue to wean O2 as tolerated --per Dr. Humphrey Rolls (pulm) - discharge on prednisone 60 mg daily until follow up  Bilateral lobar pneumonia In setting of recent COVID-19 pneumonia.  Completed course of Zithromaxand Rocephin.  Sarcoidosis Noted to have progressed since 2011 on CT scan obtained on admission. Appears quite extensive and severe on personal review of CT scan. --Continue steroids as above --Follow with pulmonology  Type 2 diabetes Hyperglycemia - secondary to steroids A1c 8.0%. Not on medications for diabetes as outpatient. --continueLantusto 10units at bedtime --increaseNovoLogto22units with meals  --continue sensitive slliding scale NovoLog --Should be discharged on an oral medication and follow-up with PCP closely --Carb modified diet  --Diabetes coordinator consulted as patient likely to need insulin on discharge given long steroid taper  Back Pain - likely from immobility this admission --heating pad --Norco PRN --Robaxin PRN  Essential hypertension-chronic, stable -Continue Norvasc  Restless leg syndrome-chronic, stable -Continue Requip  Generalized weakness-secondary to recent acute illnesses and underlying comorbidities. --PT eval --Home health PT recommended,TOC consulted to arrange   DVT prophylaxis:Lovenox Code Status: Full Code Family Communication:none at bedside Disposition Plan:Expect discharge to SNF/rehab in 1-2 days, pending bed. Continue weaning down oxygen and IV steroids as above.   Consultants:  Pulmonology  Procedures:  None  Antimicrobials:  Rocephincompleted  Zithromax  completed  Objective: Vitals:   12/24/19 0444 12/24/19 1141 12/24/19 1145 12/24/19 1701  BP: 133/80  121/66   Pulse: 97  96   Resp: 18  18   Temp: 98.8 F (  37.1 C)  97.9 F (36.6 C)   TempSrc: Oral  Oral   SpO2: 95% 92% 91% 92%  Weight:      Height:        Intake/Output Summary (Last 24 hours) at 12/24/2019 1710 Last data filed at 12/24/2019 0500 Gross per 24 hour  Intake 60 ml  Output 350 ml  Net -290 ml   Filed Weights   Jan 12, 2020 2252  Weight: 71.2 kg    Examination:  General exam: awake, alert, no acute distress HEENT: clear conjunctiva, anicteric sclera, moist mucus membranes, hearing grossly normal  Respiratory system: improved air movement, bibasilar crackles improved, mildly increased respiratory effort, on 4 L/min O2.  Cardiovascular system: normal S1/S2, RRR, no pedal edema.   Gastrointestinal system: soft, non-tender, non-distended abdomen, no organomegaly or masses felt, normal bowel sounds. Central nervous system: alert and oriented x4. no gross focal neurologic deficits, normal speech Extremities: moves all, no edema, normal tone Psychiatry: normal mood, congruent affect, judgement and insight appear normal    Data Reviewed: I have personally reviewed following labs and imaging studies  CBC: Recent Labs  Lab 12/19/19 0435 12/20/19 0611 12/21/19 0546 12/22/19 0637 12/23/19 0638  WBC 23.2* 19.8* 17.6* 15.8* 11.4*  NEUTROABS 21.7* 18.6* 16.6* 14.4* 10.3*  HGB 12.7 11.9* 12.6 12.3 11.4*  HCT 40.1 37.7 40.2 39.5 36.8  MCV 90.5 91.3 91.4 92.3 91.1  PLT 133* 118* 122* 127* 113*   Basic Metabolic Panel: Recent Labs  Lab 12/19/19 0435 12/20/19 0611 12/21/19 0546 12/22/19 0637 12/23/19 0638  NA 146* 143 144 143 140  K 4.2 4.3 4.5 4.7 4.4  CL 107 106 104 105 101  CO2 30 30 28 28 29   GLUCOSE 125* 201* 229* 239* 253*  BUN 41* 41* 40* 47* 42*  CREATININE 0.85 0.96 0.96 1.02* 0.90  CALCIUM 8.6* 8.4* 8.8* 8.6* 8.2*  MG 2.5* 2.6* 2.5* 2.6* 2.4    GFR: Estimated Creatinine Clearance: 54.2 mL/min (by C-G formula based on SCr of 0.9 mg/dL). Liver Function Tests: Recent Labs  Lab 12/19/19 0435  AST 22  ALT 27  ALKPHOS 104  BILITOT 0.9  PROT 6.4*  ALBUMIN 2.6*   No results for input(s): LIPASE, AMYLASE in the last 168 hours. No results for input(s): AMMONIA in the last 168 hours. Coagulation Profile: No results for input(s): INR, PROTIME in the last 168 hours. Cardiac Enzymes: No results for input(s): CKTOTAL, CKMB, CKMBINDEX, TROPONINI in the last 168 hours. BNP (last 3 results) No results for input(s): PROBNP in the last 8760 hours. HbA1C: No results for input(s): HGBA1C in the last 72 hours. CBG: Recent Labs  Lab 12/23/19 2104 12/24/19 0737 12/24/19 1143 12/24/19 1633 12/24/19 1652  GLUCAP 149* 143* 382* 52* 109*   Lipid Profile: No results for input(s): CHOL, HDL, LDLCALC, TRIG, CHOLHDL, LDLDIRECT in the last 72 hours. Thyroid Function Tests: No results for input(s): TSH, T4TOTAL, FREET4, T3FREE, THYROIDAB in the last 72 hours. Anemia Panel: No results for input(s): VITAMINB12, FOLATE, FERRITIN, TIBC, IRON, RETICCTPCT in the last 72 hours. Sepsis Labs: Recent Labs  Lab 12/19/19 2208 12/20/19 0611 12/21/19 0546  PROCALCITON 0.52 0.64 0.47    Recent Results (from the past 240 hour(s))  MRSA PCR Screening     Status: None   Collection Time: 12/15/19  2:39 PM   Specimen: Nasal Mucosa; Nasopharyngeal  Result Value Ref Range Status   MRSA by PCR NEGATIVE NEGATIVE Final    Comment:        The  GeneXpert MRSA Assay (FDA approved for NASAL specimens only), is one component of a comprehensive MRSA colonization surveillance program. It is not intended to diagnose MRSA infection nor to guide or monitor treatment for MRSA infections. Performed at [facility], [address] [place], [place] [address]   Sputum culture     Status: None   Collection Time: 12/16/19  2:30 AM   Specimen: Sputum   Result Value Ref Range Status   Specimen Description SPUTUM  Final   Special Requests NONE  Final   Sputum evaluation   Final    THIS SPECIMEN IS ACCEPTABLE FOR SPUTUM CULTURE Performed at [facility], [address], [place], [place] [address]    Report Status 12/16/2019 FINAL  Final  Culture, respiratory     Status: None   Collection Time: 12/16/19  2:30 AM   Specimen: SPU  Result Value Ref Range Status   Specimen Description   Final    SPUTUM Performed at [facility], [address] [place], [place] [address]    Special Requests   Final    NONE Reflexed from [phone] Performed at [facility], [address] [place], [place] [address]    Gram Stain   Final    RARE WBC PRESENT, PREDOMINANTLY PMN FEW GRAM POSITIVE COCCI IN PAIRS FEW GRAM NEGATIVE RODS    Culture   Final    FEW Consistent with normal respiratory flora. Performed at [facility], [address] [place], [place] [address]    Report Status 12/19/2019 FINAL  Final         Radiology Studies: No results found.      Scheduled Meds: . amLODipine  10 mg Oral Daily  . ascorbic acid  500 mg Oral Daily  . calcium-vitamin D  1 tablet Oral Daily  . enoxaparin (LOVENOX) injection  40 mg Subcutaneous Q24H  . feeding supplement (ENSURE ENLIVE)  237 mL Oral BID BM  . fluticasone  2 spray Each Nare Daily  . fluticasone furoate-vilanterol  1 puff Inhalation Daily  . folic acid  1 mg Oral Daily  . furosemide  20 mg Oral Daily  . insulin aspart  0-5 Units Subcutaneous QHS  . insulin aspart  0-9 Units Subcutaneous TID WC  . insulin aspart  22 Units Subcutaneous TID WC  . insulin glargine  12 Units Subcutaneous QHS  . Ipratropium-Albuterol  1 puff Inhalation Q6H  . loratadine  10 mg Oral Daily  . methylPREDNISolone (SOLU-MEDROL) injection  40 mg Intravenous BID  . multivitamin with minerals  1 tablet Oral Daily  . rOPINIRole  0.25 mg Oral QHS   Continuous Infusions: .  sodium chloride 20 mL (12/18/19 1006)     LOS: 10 days    Time spent: 30 minutes    Pennie Banter, DO [facility]   If 7PM-7AM, please contact night-coverage [email or web address] 12/24/2019, 5:10 PM

## 2019-12-25 LAB — GLUCOSE, CAPILLARY
Glucose-Capillary: 204 mg/dL — ABNORMAL HIGH (ref 70–99)
Glucose-Capillary: 206 mg/dL — ABNORMAL HIGH (ref 70–99)
Glucose-Capillary: 170 mg/dL — ABNORMAL HIGH (ref 70–99)
Glucose-Capillary: 142 mg/dL — ABNORMAL HIGH (ref 70–99)

## 2019-12-25 MED ORDER — INSULIN ASPART 100 UNIT/ML ~~LOC~~ SOLN
19.0000 [IU] | Freq: Three times a day (TID) | SUBCUTANEOUS | Status: DC
  Administered 2019-12-25 – 2020-01-02 (×20): 19 [IU] via SUBCUTANEOUS
  Filled 2019-12-25 (×19): qty 1

## 2019-12-25 MED ORDER — PREDNISONE 20 MG PO TABS
60.0000 mg | ORAL_TABLET | Freq: Every day | ORAL | Status: DC
  Administered 2019-12-26 – 2019-12-28 (×3): 60 mg via ORAL
  Filled 2019-12-25 (×3): qty 3

## 2019-12-25 MED ORDER — PREDNISONE 20 MG PO TABS
20.0000 mg | ORAL_TABLET | Freq: Once | ORAL | Status: AC
  Administered 2019-12-25: 20 mg via ORAL
  Filled 2019-12-25: qty 1

## 2019-12-25 NOTE — TOC Progression Note (Addendum)
Transition of Care [facility]) - Progression Note    Patient Details  Name: Marissa Luna MRN: [id number] Date of Birth: [birth year]  Transition of Care [facility] CM/SW Contact  Chapman Fitch, RN Phone Number: 12/25/2019, 4:23 PM  Clinical Narrative:    RNCM reached out to patient's insurance company Patient does have SNF benefits  However, she is limited on the facilities that are in network.  Per [facility], [facility], [facility], [facility] and [facility] in [place] are in network  Patient was covid positive 25 days ago, is asymptomatic, and has been removed from isolation precautions here at the hospital  Patient is currently requiring 4L O2  - [facility] - has declined, message left with admissions coordinator to follow up - [facility] - still pending - message left for admissions coordinator  Will reach out to other 2 facilities tomorrow   RNCM reached out to leadership team as patient may be difficult to place     Expected Discharge Plan: Home w Home Health Services    Expected Discharge Plan and Services Expected Discharge Plan: Home w Home Health Services   Discharge Planning Services: CM Consult Post Acute Care Choice: Home Health, Durable Medical Equipment Living arrangements for the past 2 months: Single Family Home Expected Discharge Date: 12/19/19               DME Arranged: Dan Humphreys rolling DME Agency: [employer]       HH Arranged: RN, PT HH Agency: [facility] Date HH Agency Contacted: 12/17/19   Representative spoke with at [facility] Agency: Barbara Cower   Social Determinants of Health (SDOH) Interventions    Readmission Risk Interventions No flowsheet data found.

## 2019-12-25 NOTE — Progress Notes (Signed)
PROGRESS NOTE    Marissa Luna  ZOX:[id number] DOB: 1955/12/18 DOA: 12/19/2019  PCP: Carlean Jews, NP    LOS - 11   Brief Narrative:  64 y.o.femalewith medical historyofsarcoidosis and COPD with chronic respiratory failure on2L/min homeO2, hypertension and recently hospitalizedfrom 11/30/19 - 12/30/20withCOVID-19pneumonia andacute on chronicrespiratory failure treated with 5 days of remdesivir and Decadron. She presented to the ED on 1/8with a complaintsof increased cough, weakness and shortness of breath.In the ED, afebrile, mildly tachycardic and tachypneic, hypoxic in 80's on room air. Labs notable for leukocytosis 17k, elevated inflammatory markers including D-dimer of 1572, ferritin 645, CRP 41.6. CT chest showed hazy groundglass opacities seen within both lungs consistent with COVID-19 pneumonia,and interval progression of sarcoidosis compared to 2011. She was started on IV antibiotics, dexamethasone andadmitted tohospitalistservice.  Subjective 1/20: Patient seen this AM.  Reports she feels a little better each day.  Does feel generally weak.  Says she is eating too much.  Leg pain better with medication.  Discussed change to oral steroid today, dose she will discharge on.  No fever/chills or other acute complaints.  Assessment & Plan:   Principal Problem:   Acute on chronic respiratory failure with hypoxia (HCC) Active Problems:   Sarcoidosis of lung (HCC)   Essential hypertension   Weakness   Healthcare-associated pneumonia   History of COVID-19   Acute respiratory failure (HCC)   Multifocal pneumonia   Type 2 diabetes mellitus without complication, without long-term current use of insulin (HCC)   Acute on chronic respiratory failure with hypoxiain setting of recent Covid-19 pneumonia, admitted 12/26 to 12/30.Completed treatment with 5 days remdesivir and 10 days Decadron.Hadbeenusing 2 L/min nocturnaloxygen atbaseline.Increased oxygen  requirements 1/13-1/14, up to 40 L/min HFNC, from8 L/minpreviously.On 1/18 able to be weaned down to 5 L/min (after few days higher dose steroids).  Now at 4 L/min today. --will transition to Predisone 60 mg today to see how she does before discharge  --supplemental oxygen to keep O2 sat >88% --continue to wean O2 as tolerated --per Dr. Welton Flakes (pulm) - discharge on prednisone 60 mg daily until follow up with him  Bilateral lobar pneumonia In setting of recent COVID-19 pneumonia.  Completed course of Zithromaxand Rocephin.  Sarcoidosis Noted to have progressed since 2011 on CT scan obtained on admission. Appears quite extensive and severe on personal review of CT scan. --Continue steroids as above --Follow with pulmonology  Type 2 diabetes Hyperglycemia - secondary to steroids A1c 8.0%. Not on medications for diabetes as outpatient. --continueLantusto 10units at bedtime --increaseNovoLogto22units with meals  --continue sensitive slliding scale NovoLog --Should be discharged on an oral medication and follow-up with PCP closely --Carb modified diet --Diabetes coordinator consulted as patient likely to need insulin on discharge given long steroid taper  Back Pain - likely from immobility this admission --heating pad --Norco PRN --Robaxin PRN  Essential hypertension-chronic, stable -Continue Norvasc  Restless leg syndrome-chronic, stable -Continue Requip  Generalized weakness-secondary to recent acute illnesses and underlying comorbidities. --PT eval --Home health PT recommended,TOC consulted to arrange   DVT prophylaxis:Lovenox Code Status: Full Code Family Communication:none at bedside Disposition Plan:Expect discharge to SNF/rehab in 1-2 day, pending bed. Continue weaning down oxygen and steroids as above.     Consultants:  Pulmonology  Procedures:  None  Antimicrobials:  Rocephincompleted  Zithromax completed    Objective: Vitals:   12/24/19 1701 12/24/19 2137 12/25/19 0521 12/25/19 0814  BP:  121/69 118/64   Pulse:  90 (!) 102   Resp:  Marland Kitchen)  22 20   Temp:  99.3 F (37.4 C) 97.6 F (36.4 C)   TempSrc:  Oral Oral   SpO2: 92% 92% 91% 94%  Weight:      Height:        Intake/Output Summary (Last 24 hours) at 12/25/2019 1554 Last data filed at 12/25/2019 1505 Gross per 24 hour  Intake 120 ml  Output 300 ml  Net -180 ml   Filed Weights   01-09-2020 2252 12/24/19 1552  Weight: 71.2 kg 70.6 kg    Examination:  General exam: awake, alert, no acute distress Respiratory system: aeration improving, bibasilar crackles much better (nearly resolved on right), no wheezes or rhonchi, normal respiratory effort. Cardiovascular system: normal S1/S2, RRR, no pedal edema.   Central nervous system: alert and oriented x4. no gross focal neurologic deficits, normal speech Extremities: moves all, no edema, normal tone Skin: dry, intact, normal color and temperature Psychiatry: normal mood, congruent affect, judgement and insight appear normal    Data Reviewed: I have personally reviewed following labs and imaging studies  CBC: Recent Labs  Lab 12/19/19 0435 12/20/19 0611 12/21/19 0546 12/22/19 0637 12/23/19 0638  WBC 23.2* 19.8* 17.6* 15.8* 11.4*  NEUTROABS 21.7* 18.6* 16.6* 14.4* 10.3*  HGB 12.7 11.9* 12.6 12.3 11.4*  HCT 40.1 37.7 40.2 39.5 36.8  MCV 90.5 91.3 91.4 92.3 91.1  PLT 133* 118* 122* 127* 113*   Basic Metabolic Panel: Recent Labs  Lab 12/19/19 0435 12/20/19 0611 12/21/19 0546 12/22/19 0637 12/23/19 0638  NA 146* 143 144 143 140  K 4.2 4.3 4.5 4.7 4.4  CL 107 106 104 105 101  CO2 30 30 28 28 29   GLUCOSE 125* 201* 229* 239* 253*  BUN 41* 41* 40* 47* 42*  CREATININE 0.85 0.96 0.96 1.02* 0.90  CALCIUM 8.6* 8.4* 8.8* 8.6* 8.2*  MG 2.5* 2.6* 2.5* 2.6* 2.4   GFR: Estimated Creatinine Clearance: 54 mL/min (by C-G formula based on SCr of 0.9 mg/dL). Liver Function Tests:  Recent Labs  Lab 12/19/19 0435  AST 22  ALT 27  ALKPHOS 104  BILITOT 0.9  PROT 6.4*  ALBUMIN 2.6*   No results for input(s): LIPASE, AMYLASE in the last 168 hours. No results for input(s): AMMONIA in the last 168 hours. Coagulation Profile: No results for input(s): INR, PROTIME in the last 168 hours. Cardiac Enzymes: No results for input(s): CKTOTAL, CKMB, CKMBINDEX, TROPONINI in the last 168 hours. BNP (last 3 results) No results for input(s): PROBNP in the last 8760 hours. HbA1C: No results for input(s): HGBA1C in the last 72 hours. CBG: Recent Labs  Lab 12/24/19 1633 12/24/19 1652 12/24/19 2134 12/25/19 0742 12/25/19 1133  GLUCAP 52* 109* 107* 204* 206*   Lipid Profile: No results for input(s): CHOL, HDL, LDLCALC, TRIG, CHOLHDL, LDLDIRECT in the last 72 hours. Thyroid Function Tests: No results for input(s): TSH, T4TOTAL, FREET4, T3FREE, THYROIDAB in the last 72 hours. Anemia Panel: No results for input(s): VITAMINB12, FOLATE, FERRITIN, TIBC, IRON, RETICCTPCT in the last 72 hours. Sepsis Labs: Recent Labs  Lab 12/19/19 2208 12/20/19 0611 12/21/19 0546  PROCALCITON 0.52 0.64 0.47    Recent Results (from the past 240 hour(s))  Sputum culture     Status: None   Collection Time: 12/16/19  2:30 AM   Specimen: Sputum  Result Value Ref Range Status   Specimen Description SPUTUM  Final   Special Requests NONE  Final   Sputum evaluation   Final    THIS SPECIMEN IS ACCEPTABLE FOR  SPUTUM CULTURE Performed at [facility], [address] [place], [place] [address]    Report Status 12/16/2019 FINAL  Final  Culture, respiratory     Status: None   Collection Time: 12/16/19  2:30 AM   Specimen: SPU  Result Value Ref Range Status   Specimen Description   Final    SPUTUM Performed at [facility], [address] [place], [place] [address]    Special Requests   Final    NONE Reflexed from [id number] Performed at [facility], [address] [place], [place] [address]    Gram Stain   Final    RARE WBC PRESENT, PREDOMINANTLY PMN FEW GRAM POSITIVE COCCI IN PAIRS FEW GRAM NEGATIVE RODS    Culture   Final    FEW Consistent with normal respiratory flora. Performed at [facility], [place] [address] [place], [place] [address]    Report Status 12/19/2019 FINAL  Final         Radiology Studies: No results found.      Scheduled Meds: . amLODipine  10 mg Oral Daily  . ascorbic acid  500 mg Oral Daily  . calcium-vitamin D  1 tablet Oral Daily  . enoxaparin (LOVENOX) injection  40 mg Subcutaneous Q24H  . feeding supplement (ENSURE ENLIVE)  237 mL Oral BID BM  . fluticasone  2 spray Each Nare Daily  . fluticasone furoate-vilanterol  1 puff Inhalation Daily  . folic acid  1 mg Oral Daily  . furosemide  20 mg Oral Daily  . insulin aspart  0-5 Units Subcutaneous QHS  . insulin aspart  0-9 Units Subcutaneous TID WC  . insulin aspart  19 Units Subcutaneous TID WC  . insulin glargine  12 Units Subcutaneous QHS  . Ipratropium-Albuterol  1 puff Inhalation Q6H  . loratadine  10 mg Oral Daily  . methylPREDNISolone (SOLU-MEDROL) injection  40 mg Intravenous BID  . multivitamin with minerals  1 tablet Oral Daily  . rOPINIRole  0.25 mg Oral QHS   Continuous Infusions: . sodium chloride 20 mL (12/18/19 1006)     LOS: 11 days    Time spent: 30 minutes    Ezekiel Slocumb, DO [facility]   If 7PM-7AM, please contact night-coverage [email or web address] 12/25/2019, 3:54 PM

## 2019-12-26 LAB — CBC WITH DIFFERENTIAL/PLATELET
Abs Immature Granulocytes: 0.24 10*3/uL — ABNORMAL HIGH (ref 0.00–0.07)
Basophils Absolute: 0.1 10*3/uL (ref 0.0–0.1)
Basophils Relative: 1 %
Eosinophils Absolute: 0 10*3/uL (ref 0.0–0.5)
Eosinophils Relative: 0 %
HCT: 38.9 % (ref 36.0–46.0)
Hemoglobin: 12.1 g/dL (ref 12.0–15.0)
Immature Granulocytes: 2 %
Lymphocytes Relative: 6 %
Lymphs Abs: 0.9 10*3/uL (ref 0.7–4.0)
MCH: 28.5 pg (ref 26.0–34.0)
MCHC: 31.1 g/dL (ref 30.0–36.0)
MCV: 91.5 fL (ref 80.0–100.0)
Monocytes Absolute: 0.8 10*3/uL (ref 0.1–1.0)
Monocytes Relative: 5 %
Neutro Abs: 12.6 10*3/uL — ABNORMAL HIGH (ref 1.7–7.7)
Neutrophils Relative %: 86 %
Platelets: 178 10*3/uL (ref 150–400)
RBC: 4.25 MIL/uL (ref 3.87–5.11)
RDW: 14.1 % (ref 11.5–15.5)
Smear Review: NORMAL
WBC: 14.6 10*3/uL — ABNORMAL HIGH (ref 4.0–10.5)
nRBC: 0.1 % (ref 0.0–0.2)

## 2019-12-26 LAB — GLUCOSE, CAPILLARY
Glucose-Capillary: 118 mg/dL — ABNORMAL HIGH (ref 70–99)
Glucose-Capillary: 296 mg/dL — ABNORMAL HIGH (ref 70–99)
Glucose-Capillary: 259 mg/dL — ABNORMAL HIGH (ref 70–99)
Glucose-Capillary: 143 mg/dL — ABNORMAL HIGH (ref 70–99)

## 2019-12-26 LAB — BASIC METABOLIC PANEL
Anion gap: 11 (ref 5–15)
BUN: 31 mg/dL — ABNORMAL HIGH (ref 8–23)
CO2: 31 mmol/L (ref 22–32)
Calcium: 8.5 mg/dL — ABNORMAL LOW (ref 8.9–10.3)
Chloride: 103 mmol/L (ref 98–111)
Creatinine, Ser: 0.65 mg/dL (ref 0.44–1.00)
GFR calc Af Amer: >60 mL/min (ref >60)
GFR calc non Af Amer: >60 mL/min (ref >60)
Glucose, Bld: 97 mg/dL (ref 70–99)
Potassium: 4.5 mmol/L (ref 3.5–5.1)
Sodium: 145 mmol/L (ref 135–145)

## 2019-12-26 MED ORDER — FUROSEMIDE 10 MG/ML IJ SOLN
40.0000 mg | Freq: Once | INTRAMUSCULAR | Status: AC
  Administered 2019-12-26: 40 mg via INTRAVENOUS
  Filled 2019-12-26: qty 4

## 2019-12-26 NOTE — TOC Progression Note (Signed)
Transition of Care [facility]) - Progression Note    Patient Details  Name: Marissa Luna MRN: [id number] Date of Birth: 1956/07/22  Transition of Care [facility]) CM/SW Contact  Chapman Fitch, RN Phone Number: 12/26/2019, 11:14 AM  Clinical Narrative:     2nd message left for Debra at [facility]   Expected Discharge Plan: Home w Home Health Services    Expected Discharge Plan and Services Expected Discharge Plan: Home w Home Health Services   Discharge Planning Services: CM Consult Post Acute Care Choice: Home Health, Durable Medical Equipment Living arrangements for the past 2 months: Single Family Home Expected Discharge Date: 12/19/19               DME Arranged: Dan Humphreys rolling DME Agency: [employer]       HH Arranged: RN, PT HH Agency: [facility] Date HH Agency Contacted: 12/17/19   Representative spoke with at [facility] Agency: Barbara Cower   Social Determinants of Health (SDOH) Interventions    Readmission Risk Interventions No flowsheet data found.

## 2019-12-26 NOTE — Progress Notes (Signed)
PROGRESS NOTE    Marissa Luna  DXI:[id number] DOB: [birth year] DOA: 12/12/2019  PCP: Carlean Jews, NP    LOS - 12   Brief Narrative:  64 y.o.femalewith medical historyofsarcoidosis and COPD with chronic respiratory failure on2L/min homeO2, hypertension and recently hospitalizedfrom 11/30/19 - 12/30/20withCOVID-19pneumonia andacute on chronicrespiratory failure treated with 5 days of remdesivir and Decadron. She presented to the ED on 1/8with a complaintsof increased cough, weakness and shortness of breath.In the ED, afebrile, mildly tachycardic and tachypneic, hypoxic in 80's on room air. Labs notable for leukocytosis 17k, elevated inflammatory markers including D-dimer of 1572, ferritin 645, CRP 41.6. CT chest showed hazy groundglass opacities seen within both lungs consistent with COVID-19 pneumonia,and interval progression of sarcoidosis compared to 2011. She was started on IV antibiotics, dexamethasone andadmitted tohospitalistservice.  Subjective 1/20: Patient feels more short of breath today. She denies any chest pain. She continues to wheeze and cough. Oxygen turned up to 6L/min today  Assessment & Plan:   Principal Problem:   Acute on chronic respiratory failure with hypoxia (HCC) Active Problems:   Sarcoidosis of lung (HCC)   Essential hypertension   Weakness   Healthcare-associated pneumonia   History of COVID-19   Acute respiratory failure (HCC)   Multifocal pneumonia   Type 2 diabetes mellitus without complication, without long-term current use of insulin (HCC)   Acute on chronic respiratory failure with hypoxiain setting of recent Covid-19 pneumonia, admitted 12/26 to 12/30.Completed treatment with 5 days remdesivir and 10 days Decadron.Hadbeenusing 2 L/min nocturnaloxygen atbaseline.Increased oxygen requirements 1/13-1/14, up to 40 L/min HFNC, from8 L/minpreviously.On 1/20 able to be weaned down to 4 L/min (after few days higher  dose steroids).  Today, she felt more short of breath and oxygen increased to 6L/min. --Continue on Predisone 60 mg until she follows up with her pulmonologist  --supplemental oxygen to keep O2 sat >88% --continue to wean O2 as tolerated --per Dr. Welton Flakes (pulm) - discharge on prednisone 60 mg daily until follow up with him -will give one dose of IV lasix today  Bilateral lobar pneumonia In setting of recent COVID-19 pneumonia.  Completed course of Zithromaxand Rocephin.  Sarcoidosis Noted to have progressed since 2011 on CT scan obtained on admission. Appears quite extensive and severe on personal review of CT scan. --Continue steroids as above --Follow with pulmonology  Type 2 diabetes Hyperglycemia - secondary to steroids A1c 8.0%. Not on medications for diabetes as outpatient. --continueLantusto 10units at bedtime --increaseNovoLogto22units with meals  --continue sensitive slliding scale NovoLog --Should be discharged on an oral medication and follow-up with PCP closely --Carb modified diet --Diabetes coordinator consulted as patient likely to need insulin on discharge given long steroid taper  Back Pain - likely from immobility this admission --heating pad --Norco PRN --Robaxin PRN  Essential hypertension-chronic, stable -Continue Norvasc  Restless leg syndrome-chronic, stable -Continue Requip  Generalized weakness-secondary to recent acute illnesses and underlying comorbidities. --PT eval --Home health PT recommended,TOC consulted to arrange   DVT prophylaxis:Lovenox Code Status: Full Code Family Communication:none at bedside Disposition Plan:Patient is unsure about going to SNF. I discussed the advantages and safety for her to go to a skilled facility at discharge and she seems agreeable to go now.   Consultants:  Pulmonology  Procedures:  None  Antimicrobials:  Rocephincompleted  Zithromax  completed   Objective: Vitals:   12/25/19 2139 12/26/19 0651 12/26/19 1313 12/26/19 1407  BP: 135/61 136/68  115/67  Pulse: (!) 107 (!) 106  (!) 101  Resp: (!) 24  20  18  Temp: 98 F (36.7 C) 98.2 F (36.8 C)  98 F (36.7 C)  TempSrc:  Oral  Oral  SpO2: (!) 88% 96% 95% 99%  Weight:      Height:        Intake/Output Summary (Last 24 hours) at 12/26/2019 1919 Last data filed at 12/26/2019 1900 Gross per 24 hour  Intake 960 ml  Output 1900 ml  Net -940 ml   Filed Weights   12-24-2019 2252 12/24/19 1552  Weight: 71.2 kg 70.6 kg    Examination:  General exam: Alert, awake, oriented x 3 Respiratory system: bilateral crackles. Respiratory effort normal. Cardiovascular system:RRR. No murmurs, rubs, gallops. Gastrointestinal system: Abdomen is nondistended, soft and nontender. No organomegaly or masses felt. Normal bowel sounds heard. Central nervous system: Alert and oriented. No focal neurological deficits. Extremities: No C/C/E, +pedal pulses Skin: No rashes, lesions or ulcers Psychiatry: Judgement and insight appear normal. Mood & affect appropriate.    Data Reviewed: I have personally reviewed following labs and imaging studies  CBC: Recent Labs  Lab 12/20/19 0611 12/21/19 0546 12/22/19 0637 12/23/19 0638 12/26/19 0456  WBC 19.8* 17.6* 15.8* 11.4* 14.6*  NEUTROABS 18.6* 16.6* 14.4* 10.3* 12.6*  HGB 11.9* 12.6 12.3 11.4* 12.1  HCT 37.7 40.2 39.5 36.8 38.9  MCV 91.3 91.4 92.3 91.1 91.5  PLT 118* 122* 127* 113* 178   Basic Metabolic Panel: Recent Labs  Lab 12/20/19 0611 12/21/19 0546 12/22/19 0637 12/23/19 0638 12/26/19 0456  NA 143 144 143 140 145  K 4.3 4.5 4.7 4.4 4.5  CL 106 104 105 101 103  CO2 30 28 28 29 31   GLUCOSE 201* 229* 239* 253* 97  BUN 41* 40* 47* 42* 31*  CREATININE 0.96 0.96 1.02* 0.90 0.65  CALCIUM 8.4* 8.8* 8.6* 8.2* 8.5*  MG 2.6* 2.5* 2.6* 2.4  --    GFR: Estimated Creatinine Clearance: 60.8 mL/min (by C-G formula based on SCr of  0.65 mg/dL). Liver Function Tests: No results for input(s): AST, ALT, ALKPHOS, BILITOT, PROT, ALBUMIN in the last 168 hours. No results for input(s): LIPASE, AMYLASE in the last 168 hours. No results for input(s): AMMONIA in the last 168 hours. Coagulation Profile: No results for input(s): INR, PROTIME in the last 168 hours. Cardiac Enzymes: No results for input(s): CKTOTAL, CKMB, CKMBINDEX, TROPONINI in the last 168 hours. BNP (last 3 results) No results for input(s): PROBNP in the last 8760 hours. HbA1C: No results for input(s): HGBA1C in the last 72 hours. CBG: Recent Labs  Lab 12/25/19 1656 12/25/19 2133 12/26/19 0837 12/26/19 1148 12/26/19 1648  GLUCAP 170* 142* 118* 296* 259*   Lipid Profile: No results for input(s): CHOL, HDL, LDLCALC, TRIG, CHOLHDL, LDLDIRECT in the last 72 hours. Thyroid Function Tests: No results for input(s): TSH, T4TOTAL, FREET4, T3FREE, THYROIDAB in the last 72 hours. Anemia Panel: No results for input(s): VITAMINB12, FOLATE, FERRITIN, TIBC, IRON, RETICCTPCT in the last 72 hours. Sepsis Labs: Recent Labs  Lab 12/19/19 2208 12/20/19 0611 12/21/19 0546  PROCALCITON 0.52 0.64 0.47    No results found for this or any previous visit (from the past 240 hour(s)).       Radiology Studies: No results found.      Scheduled Meds: . amLODipine  10 mg Oral Daily  . ascorbic acid  500 mg Oral Daily  . calcium-vitamin D  1 tablet Oral Daily  . enoxaparin (LOVENOX) injection  40 mg Subcutaneous Q24H  . feeding supplement (ENSURE ENLIVE)  237 mL Oral BID BM  . fluticasone  2 spray Each Nare Daily  . fluticasone furoate-vilanterol  1 puff Inhalation Daily  . folic acid  1 mg Oral Daily  . furosemide  40 mg Intravenous Once  . furosemide  20 mg Oral Daily  . insulin aspart  0-5 Units Subcutaneous QHS  . insulin aspart  0-9 Units Subcutaneous TID WC  . insulin aspart  19 Units Subcutaneous TID WC  . insulin glargine  12 Units Subcutaneous QHS   . Ipratropium-Albuterol  1 puff Inhalation Q6H  . loratadine  10 mg Oral Daily  . multivitamin with minerals  1 tablet Oral Daily  . predniSONE  60 mg Oral Q breakfast  . rOPINIRole  0.25 mg Oral QHS   Continuous Infusions: . sodium chloride 20 mL (12/18/19 1006)     LOS: 12 days    Time spent: 30 minutes    Kathie Dike, MD [facility]   If 7PM-7AM, please contact night-coverage [email or web address] 12/26/2019, 7:19 PM

## 2019-12-26 NOTE — TOC Progression Note (Signed)
Transition of Care [facility]) - Progression Note    Patient Details  Name: Marissa Luna MRN: [id number] Date of Birth: 1956/04/13  Transition of Care [facility]) CM/SW Contact  Chapman Fitch, RN Phone Number: 12/26/2019, 3:47 PM  Clinical Narrative:    Received call from [facility].  They are able to offer a bed  Bed presented to patient.  Patient states she is unsure if she could accept.  She would like me to speak with the daughter.  She states that daughter does not get off of work until 7pm.  RNCM attempted to call daughter, unable to leave voicemail.  RNCM notified patient that I would need to be able to speak with daughter during business hours, or she could discuss with daughter and let me know her decision tomorrow    Expected Discharge Plan: Home w Home Health Services    Expected Discharge Plan and Services Expected Discharge Plan: Home w Home Health Services   Discharge Planning Services: CM Consult Post Acute Care Choice: Home Health, Durable Medical Equipment Living arrangements for the past 2 months: Single Family Home Expected Discharge Date: 12/19/19               DME Arranged: Dan Humphreys rolling DME Agency: [employer]       HH Arranged: RN, PT HH Agency: [facility] Date HH Agency Contacted: 12/17/19   Representative spoke with at [facility] Agency: Barbara Cower   Social Determinants of Health (SDOH) Interventions    Readmission Risk Interventions No flowsheet data found.

## 2019-12-26 NOTE — Evaluation (Signed)
Occupational Therapy Re-Evaluation Patient Details Name: Marissa Luna MRN: [id number] DOB: [birth year] Today's Date: 12/26/2019    History of Present Illness Pt admitted for acute on chronic respiratory failure with hypoxia secondary to Covid. She complains of cough, weakness, and SOB symptoms. History includes sarcodosis, COPD, CRD on 2L of O2 chronic, HTN and recent hospital stay for covid 12/26- 12/30, now off airborne isolation..   Clinical Impression   Pt seen for OT Re-Evaluation this date to re-assess pt's performance/tolerance/independence with self care ADLs and ADL mobility. Pt tolerates treatment relatively well, somewhat limited d/t O2 needs (see precaution section for details). In sitting, pt demos improved tolerance for LB dressing, threading socks without assistance. Pt requires MIN A for transfer (sit<>stand) and tolerates standing x3 mins only with extended time to prep to stand and extended time for seated rest break after. OT attempts to engage pt in more dynamic standing task to simulate standing fxl activity, MIN A provided for balance and use of RW. Pt alternates UEs 1x each from hand grip and cannot tolerate further alternation d/t fatigue. OT facilitates education re: POC to gradually increase pt tolerance for standing activity and pt agreeable. In addition, PLB education provided. Overall, HHOT remains appropriate if intermittent supv from family is available for pt safety.     Follow Up Recommendations  Home health OT;Supervision - Intermittent    Equipment Recommendations  Tub/shower seat;Other (comment)(reacher, handheld shower head)    Recommendations for Other Services       Precautions / Restrictions Precautions Precautions: Fall Precaution Comments: Watch HR&O2, now on 6L HFNC this date. SpO2 increase from 93% to 97% in standing, but decreased temporarily during seated rest break with accurate pleth (86-87%), ~45 seconds and PLB education to recover to >90%. HR  increase from 90bpm to 117 bpm from sitting to standing, in 110s througout stand. Restrictions Weight Bearing Restrictions: No      Mobility Bed Mobility               General bed mobility comments: in recliner before and after session, was requiring supv for bed mobility last session  Transfers Overall transfer level: Needs assistance Equipment used: Rolling walker (2 wheeled) Transfers: Sit to/from Stand Sit to Stand: Min assist              Balance Overall balance assessment: Needs assistance Sitting-balance support: Feet supported Sitting balance-Leahy Scale: Good     Standing balance support: Bilateral upper extremity supported Standing balance-Leahy Scale: Fair Standing balance comment: requires CGA for static standing, MIN/MOD A for more dynamic standing tasks, only tolerates stand for 3 mins, OT attempts to encourage alternating hands to smiulate participation in fxl task. Pt with relatively low toelrance threshhold.                           ADL either performed or assessed with clinical judgement   ADL   Eating/Feeding: Independent   Grooming: Set up;Sitting           Upper Body Dressing : Set up;Sitting   Lower Body Dressing: Minimal assistance;Sit to/from stand Lower Body Dressing Details (indicate cue type and reason): pt requires no assistance to thread socks this date, requires extended time. MIN A to stand to simulate clothing mgt over hips. Toilet Transfer: Minimal assistance;Stand-pivot;BSC   Toileting- Clothing Manipulation and Hygiene: Sit to/from stand;Minimal assistance;Moderate assistance Toileting - Clothing Manipulation Details (indicate cue type and reason): pt with difficulty tolerating alternating  hands off walker to simulate more dynamic standing tasks.     Functional mobility during ADLs: Minimal assistance;Rolling walker(assist to weight shift d/t L hip/groin pain)       Vision Patient Visual Report: No change  from baseline       Perception     Praxis      Pertinent Vitals/Pain Pain Assessment: 0-10 Pain Score: 5  Pain Location: L hip/groin area Pain Descriptors / Indicators: Aching Pain Intervention(s): Monitored during session     Hand Dominance     Extremity/Trunk Assessment Upper Extremity Assessment Upper Extremity Assessment: Generalized weakness   Lower Extremity Assessment Lower Extremity Assessment: Generalized weakness   Cervical / Trunk Assessment Cervical / Trunk Assessment: Normal   Communication Communication Communication: No difficulties   Cognition Arousal/Alertness: Awake/alert Behavior During Therapy: WFL for tasks assessed/performed Overall Cognitive Status: Within Functional Limits for tasks assessed                                     General Comments       Exercises Other Exercises Other Exercises: OT facilitates education with pt re: postural exercises she can complete while seated in chair to encourage lung strength. Pt demos understanding.   Shoulder Instructions      Home Living Family/patient expects to be discharged to:: Private residence Living Arrangements: Spouse/significant other;Children Available Help at Discharge: Family Type of Home: House Home Access: Stairs to enter Technical brewer of Steps: 6 Entrance Stairs-Rails: None Home Layout: One level     Bathroom Shower/Tub: Teacher, early years/pre: [place]: None          Prior Functioning/Environment Level of Independence: Independent        Comments: wears 2L of O2 at baseline, reports no falls and was indep prior to admission. Does report that she does fatigue quickly with exertion and has difficulty performing ADLs due to low energy and SOB        OT Problem List: Decreased knowledge of use of DME or AE;Decreased activity tolerance;Decreased strength      OT Treatment/Interventions: Self-care/ADL  training;Therapeutic exercise;Therapeutic activities;Energy conservation;DME and/or AE instruction;Patient/family education    OT Goals(Current goals can be found in the care plan section) Acute Rehab OT Goals Patient Stated Goal: to get stronger and breathe better OT Goal Formulation: With patient Time For Goal Achievement: 01/09/20 Potential to Achieve Goals: Good  OT Frequency: Min 1X/week   Barriers to D/C:            Co-evaluation              AM-PAC OT "6 Clicks" Daily Activity     Outcome Measure Help from another person eating meals?: None Help from another person taking care of personal grooming?: None Help from another person toileting, which includes using toliet, bedpan, or urinal?: A Little Help from another person bathing (including washing, rinsing, drying)?: A Little Help from another person to put on and taking off regular upper body clothing?: None Help from another person to put on and taking off regular lower body clothing?: A Little 6 Click Score: 21   End of Session Equipment Utilized During Treatment: Gait belt;Rolling walker Nurse Communication: Mobility status  Activity Tolerance: Patient tolerated treatment well Patient left: in chair;with call bell/phone within reach  OT Visit Diagnosis: Other abnormalities of gait and mobility (R26.89);Muscle weakness (generalized) (M62.81)  Time: 3818-4037 OT Time Calculation (min): 38 min Charges:  OT General Charges $OT Visit: 1 Visit OT Evaluation $OT Re-eval: 1 Re-eval OT Treatments $Self Care/Home Management : 8-22 mins $Therapeutic Activity: 8-22 mins  Rejeana Brock, MS, OTR/L ascom [phone] 12/26/19, 4:25 PM

## 2019-12-26 NOTE — Progress Notes (Signed)
PT Cancellation Note  Patient Details Name: FARRYN LINARES MRN: [id number] DOB: March 12, 1956   Cancelled Treatment:    Reason Eval/Treat Not Completed: Other (comment)(Per RN, pt up to chair this AM, very SOB, increased to 6L. PT agreeable to re-attempt patient as able at a later time.)   Olga Coaster PT, DPT 1:05 PM,12/26/19

## 2019-12-27 LAB — BASIC METABOLIC PANEL
Anion gap: 10 (ref 5–15)
BUN: 39 mg/dL — ABNORMAL HIGH (ref 8–23)
CO2: 30 mmol/L (ref 22–32)
Calcium: 8.3 mg/dL — ABNORMAL LOW (ref 8.9–10.3)
Chloride: 105 mmol/L (ref 98–111)
Creatinine, Ser: 0.94 mg/dL (ref 0.44–1.00)
GFR calc Af Amer: >60 mL/min (ref >60)
GFR calc non Af Amer: >60 mL/min (ref >60)
Glucose, Bld: 185 mg/dL — ABNORMAL HIGH (ref 70–99)
Potassium: 5 mmol/L (ref 3.5–5.1)
Sodium: 145 mmol/L (ref 135–145)

## 2019-12-27 LAB — GLUCOSE, CAPILLARY
Glucose-Capillary: 96 mg/dL (ref 70–99)
Glucose-Capillary: 281 mg/dL — ABNORMAL HIGH (ref 70–99)
Glucose-Capillary: 65 mg/dL — ABNORMAL LOW (ref 70–99)
Glucose-Capillary: 231 mg/dL — ABNORMAL HIGH (ref 70–99)
Glucose-Capillary: 331 mg/dL — ABNORMAL HIGH (ref 70–99)

## 2019-12-27 NOTE — Progress Notes (Signed)
Hypoglycemic Event  CBG: 65  Treatment: 4 oz juice/soda  Symptoms: None  Follow-up CBG: Time:1830 CBG Result:231  Possible Reasons for Event: Unknown  Comments/MD notified:    Madie Reno, RN

## 2019-12-27 NOTE — TOC Progression Note (Signed)
Transition of Care [facility]) - Progression Note    Patient Details  Name: CHRYSTINA NAFF MRN: [id number] Date of Birth: [birth year]  [facility]) CM/SW Contact  Chapman Fitch, RN Phone Number: 12/27/2019, 1:30 PM  Clinical Narrative:     3 way call with patient and daughter  Patient has decided she would like to accept the bed at [facility].   Kerri with [facility] notified and to start insurance auth  Expected Discharge Plan: Home w Home Health Services    Expected Discharge Plan and Services Expected Discharge Plan: Home w Home Health Services   Discharge Planning Services: CM Consult Post Acute Care Choice: Home Health, Durable Medical Equipment Living arrangements for the past 2 months: Single Family Home Expected Discharge Date: 12/19/19               DME Arranged: Dan Humphreys rolling DME Agency: [employer]       HH Arranged: RN, PT HH Agency: [facility] Date HH Agency Contacted: 12/17/19   Representative spoke with at [facility] Agency: Barbara Cower   Social Determinants of Health (SDOH) Interventions    Readmission Risk Interventions No flowsheet data found.

## 2019-12-27 NOTE — Progress Notes (Signed)
Physical Therapy Treatment Patient Details Name: Marissa Luna MRN: [id number] DOB: October 12, 1956 Today's Date: 12/27/2019    History of Present Illness Pt admitted for acute on chronic respiratory failure with hypoxia secondary to Covid. She complains of cough, weakness, and SOB symptoms. History includes sarcodosis, COPD, CRD on 2L of O2 chronic, HTN and recent hospital stay for covid 12/26- 12/30, now off airborne isolation..    PT Comments    Pt received supine (HOB elevated) in bed. Pt required minimal encouragement to participate priro to pt agreeing. Pt on 8 L O2 throughout session. O2 93% and HR 105 bpm upon arriving. Pt c/o 8/10 L hip pain. Pt able to perform bed mobility supine to/from sit with supervision + increased time. Sat EOB x 5 minutes prior to transferring to chair.Pt required min assist + FWW for safety to stand with bed at lowest height.  HR elevated to 120 bpm with O2 desat to 82% with minimal standing activity. Pt was able to take 3 steps to chair and was able to maintaining standing ~ 3 minutes prior to requesting to sit from L hip pain/ fatigue. With seated rest pt O2 recovered to 93% with breathing techniques. Pt requested no further ambulation/ standing activity at this time. Pt was in recliner post session with call bell in reach and bed alarm set. Pt is progressing with PT. Continued recommendation for SNF placement at D/C.      Follow Up Recommendations  SNF(discussed with pt benifits of SNF versus home)     Equipment Recommendations  Rolling walker with 5" wheels    Recommendations for Other Services       Precautions / Restrictions Precautions Precautions: Fall Precaution Comments: Watch HR&O2, now on 8L nasal cannula this date. SpO2 decreased from 93% to 82% in standing but once seated rest break recovers to >90%. HR increase from 105 bpm to 120 bpm from sitting to standing. Restrictions Weight Bearing Restrictions: No    Mobility  Bed Mobility Overal bed  mobility: Needs Assistance Bed Mobility: Supine to Sit     Supine to sit: Supervision;HOB elevated     General bed mobility comments: Increased time to perform with VCs for improved sequencing and breathing techniques  Transfers Overall transfer level: Needs assistance Equipment used: Rolling walker (2 wheeled) Transfers: Sit to/from Stand Sit to Stand: Min assist         General transfer comment: Pt performed sit to stand from EOB and from recliner throughout session with min assist for safety. vcs for improved technique and sequencing.  Ambulation/Gait Ambulation/Gait assistance: Min guard Gait Distance (Feet): 3 Feet Assistive device: Rolling walker (2 wheeled) Gait Pattern/deviations: WFL(Within Functional Limits)     General Gait Details: Pt o2 level droped from 93 to 82 during gait from EOB to recliner with slightly elevated HR. pt on 8 L o2 throughout session   Stairs             Wheelchair Mobility    Modified Rankin (Stroke Patients Only)       Balance Overall balance assessment: Needs assistance Sitting-balance support: Feet supported Sitting balance-Leahy Scale: Good Sitting balance - Comments: Pt sat EOB x 5 minutes prior to transfering with slight SOB that resolves with breathing techniques.    Standing balance support: Bilateral upper extremity supported Standing balance-Leahy Scale: Fair Standing balance comment: Pt was able to stand EOB x prior to complaining of increased R hip pain and fatigue.  Cognition Arousal/Alertness: Awake/alert Behavior During Therapy: WFL for tasks assessed/performed Overall Cognitive Status: Within Functional Limits for tasks assessed                                 General Comments: Pt required motivation to participate but once motivated, cooperative throughout session      Exercises General Exercises - Lower Extremity Ankle Circles/Pumps:  AROM;10 reps;Both Quad Sets: AROM;Both;10 reps Long Arc Quad: AROM;Both;10 reps Hip Flexion/Marching: AROM;Both;10 reps(while seated EOB)    General Comments        Pertinent Vitals/Pain Pain Assessment: 0-10 Pain Score: 8  Pain Location: L hip Pain Descriptors / Indicators: Constant Pain Intervention(s): Heat applied(heat pad applied post session once seated in chair)    Home Living                      Prior Function            PT Goals (current goals can now be found in the care plan section) Progress towards PT goals: Progressing toward goals    Frequency    Min 2X/week      PT Plan Current plan remains appropriate    Co-evaluation              AM-PAC PT "6 Clicks" Mobility   Outcome Measure  Help needed turning from your back to your side while in a flat bed without using bedrails?: A Little Help needed moving from lying on your back to sitting on the side of a flat bed without using bedrails?: A Little Help needed moving to and from a bed to a chair (including a wheelchair)?: A Little Help needed standing up from a chair using your arms (e.g., wheelchair or bedside chair)?: A Little Help needed to walk in hospital room?: A Lot Help needed climbing 3-5 steps with a railing? : Total 6 Click Score: 15    End of Session Equipment Utilized During Treatment: Oxygen(98 L nasal cannula throughout) Activity Tolerance: Patient tolerated treatment well;Patient limited by fatigue;Patient limited by pain Patient left: in chair;with chair alarm set;with call bell/phone within reach Nurse Communication: Mobility status PT Visit Diagnosis: Muscle weakness (generalized) (M62.81);Difficulty in walking, not elsewhere classified (R26.2)     Time: 1007-1219 PT Time Calculation (min) (ACUTE ONLY): 19 min  Charges:  $Therapeutic Activity: 8-22 mins                     Julaine Fusi PTA 12/27/19, 10:08 AM

## 2019-12-27 NOTE — Progress Notes (Signed)
PROGRESS NOTE    Marissa Luna  WUJ:[id number] DOB: 02-12-1956 DOA: 12/29/2019  PCP: Carlean Jews, NP    LOS - 13   Brief Narrative:  64 y.o.femalewith medical historyofsarcoidosis and COPD with chronic respiratory failure on2L/min homeO2, hypertension and recently hospitalizedfrom 11/30/19 - 12/30/20withCOVID-19pneumonia andacute on chronicrespiratory failure treated with 5 days of remdesivir and Decadron. She presented to the ED on 1/8with a complaintsof increased cough, weakness and shortness of breath.In the ED, afebrile, mildly tachycardic and tachypneic, hypoxic in 80's on room air. Labs notable for leukocytosis 17k, elevated inflammatory markers including D-dimer of 1572, ferritin 645, CRP 41.6. CT chest showed hazy groundglass opacities seen within both lungs consistent with COVID-19 pneumonia,and interval progression of sarcoidosis compared to 2011. She was started on IV antibiotics, dexamethasone andadmitted tohospitalistservice.  Subjective 1/20: Patient feels more short of breath today. She denies any chest pain. She continues to wheeze and cough. Oxygen turned up to 6L/min today  Assessment & Plan:   Principal Problem:   Acute on chronic respiratory failure with hypoxia (HCC) Active Problems:   Sarcoidosis of lung (HCC)   Essential hypertension   Weakness   Healthcare-associated pneumonia   History of COVID-19   Acute respiratory failure (HCC)   Multifocal pneumonia   Type 2 diabetes mellitus without complication, without long-term current use of insulin (HCC)   Acute on chronic respiratory failure with hypoxiain setting of recent Covid-19 pneumonia, admitted 12/26 to 12/30.Completed treatment with 5 days remdesivir and 10 days Decadron.Hadbeenusing 2 L/min nocturnaloxygen atbaseline.Increased oxygen requirements 1/13-1/14, up to 40 L/min HFNC, from8 L/minpreviously.On 1/20 able to be weaned down to 4 L/min (after few days higher  dose steroids).  Today, she felt more short of breath and oxygen increased to 6L/min which she is on currently. --Continue on Predisone 60 mg until she follows up with her pulmonologist  --supplemental oxygen to keep O2 sat >88% --continue to wean O2 as tolerated --per Dr. Welton Flakes (pulm) - discharge on prednisone 60 mg daily until follow up with him -will give one dose of IV lasix today  Bilateral lobar pneumonia In setting of recent COVID-19 pneumonia.  Completed course of Zithromaxand Rocephin.  Sarcoidosis Noted to have progressed since 2011 on CT scan obtained on admission. Appears quite extensive and severe on personal review of CT scan. --Continue steroids as above --Follow with pulmonology  Type 2 diabetes Hyperglycemia - secondary to steroids A1c 8.0%. Not on medications for diabetes as outpatient. --continueLantusto 10units at bedtime --increaseNovoLogto22units with meals  --continue sensitive slliding scale NovoLog --Should be discharged on an oral medication and follow-up with PCP closely --Carb modified diet --Diabetes coordinator consulted as patient likely to need insulin on discharge given long steroid taper  Back Pain - likely from immobility this admission --heating pad --Norco PRN --Robaxin PRN  Essential hypertension-chronic, stable -Continue Norvasc  Restless leg syndrome-chronic, stable -Continue Requip  Generalized weakness-secondary to recent acute illnesses and underlying comorbidities. --PT eval --Home health PT recommended,TOC consulted to arrange   DVT prophylaxis:Lovenox Code Status: Full Code Family Communication:none at bedside Disposition Plan:Patient is unsure about going to SNF. I discussed the advantages and safety for her to go to a skilled facility at discharge and she seems agreeable to go  now.   Consultants:  Pulmonology  Procedures:  None  Antimicrobials:  Rocephincompleted  Zithromax completed   Objective: Vitals:   12/26/19 1313 12/26/19 1407 12/26/19 1939 12/27/19 0414  BP:  115/67 137/61 (!) 130/59  Pulse:  (!) 101 (!) 109 Marland Kitchen)  103  Resp:  18 20 16   Temp:  98 F (36.7 C) 97.6 F (36.4 C) 98.3 F (36.8 C)  TempSrc:  Oral Oral Oral  SpO2: 95% 99%  95%  Weight:      Height:        Intake/Output Summary (Last 24 hours) at 12/27/2019 0836 Last data filed at 12/26/2019 1900 Gross per 24 hour  Intake 960 ml  Output 400 ml  Net 560 ml   Filed Weights   12/19/2019 2252 12/24/19 1552  Weight: 71.2 kg 70.6 kg    Examination:  General exam: Alert, awake, oriented x 3 Respiratory system: bilateral crackles. Respiratory effort normal. Cardiovascular system:RRR. No murmurs, rubs, gallops. Gastrointestinal system: Abdomen is nondistended, soft and nontender. No organomegaly or masses felt. Normal bowel sounds heard. Central nervous system: Alert and oriented. No focal neurological deficits. Extremities: No C/C/E, +pedal pulses Skin: No rashes, lesions or ulcers Psychiatry: Judgement and insight appear normal. Mood & affect appropriate.    Data Reviewed: I have personally reviewed following labs and imaging studies  CBC: Recent Labs  Lab 12/21/19 0546 12/22/19 0637 12/23/19 0638 12/26/19 0456  WBC 17.6* 15.8* 11.4* 14.6*  NEUTROABS 16.6* 14.4* 10.3* 12.6*  HGB 12.6 12.3 11.4* 12.1  HCT 40.2 39.5 36.8 38.9  MCV 91.4 92.3 91.1 91.5  PLT 122* 127* 113* 616   Basic Metabolic Panel: Recent Labs  Lab 12/21/19 0546 12/22/19 0637 12/23/19 0638 12/26/19 0456 12/27/19 0523  NA 144 143 140 145 145  K 4.5 4.7 4.4 4.5 5.0  CL 104 105 101 103 105  CO2 28 28 29 31 30   GLUCOSE 229* 239* 253* 97 185*  BUN 40* 47* 42* 31* 39*  CREATININE 0.96 1.02* 0.90 0.65 0.94  CALCIUM 8.8* 8.6* 8.2* 8.5* 8.3*  MG 2.5* 2.6* 2.4  --   --     GFR: Estimated Creatinine Clearance: 51.7 mL/min (by C-G formula based on SCr of 0.94 mg/dL). Liver Function Tests: No results for input(s): AST, ALT, ALKPHOS, BILITOT, PROT, ALBUMIN in the last 168 hours. No results for input(s): LIPASE, AMYLASE in the last 168 hours. No results for input(s): AMMONIA in the last 168 hours. Coagulation Profile: No results for input(s): INR, PROTIME in the last 168 hours. Cardiac Enzymes: No results for input(s): CKTOTAL, CKMB, CKMBINDEX, TROPONINI in the last 168 hours. BNP (last 3 results) No results for input(s): PROBNP in the last 8760 hours. HbA1C: No results for input(s): HGBA1C in the last 72 hours. CBG: Recent Labs  Lab 12/26/19 0837 12/26/19 1148 12/26/19 1648 12/26/19 2131 12/27/19 0746  GLUCAP 118* 296* 259* 143* 96   Lipid Profile: No results for input(s): CHOL, HDL, LDLCALC, TRIG, CHOLHDL, LDLDIRECT in the last 72 hours. Thyroid Function Tests: No results for input(s): TSH, T4TOTAL, FREET4, T3FREE, THYROIDAB in the last 72 hours. Anemia Panel: No results for input(s): VITAMINB12, FOLATE, FERRITIN, TIBC, IRON, RETICCTPCT in the last 72 hours. Sepsis Labs: Recent Labs  Lab 12/21/19 0546  PROCALCITON 0.47    No results found for this or any previous visit (from the past 240 hour(s)).       Radiology Studies: No results found.      Scheduled Meds: . amLODipine  10 mg Oral Daily  . ascorbic acid  500 mg Oral Daily  . calcium-vitamin D  1 tablet Oral Daily  . enoxaparin (LOVENOX) injection  40 mg Subcutaneous Q24H  . feeding supplement (ENSURE ENLIVE)  237 mL Oral BID BM  . fluticasone  2 spray Each Nare Daily  . fluticasone furoate-vilanterol  1 puff Inhalation Daily  . folic acid  1 mg Oral Daily  . furosemide  20 mg Oral Daily  . insulin aspart  0-5 Units Subcutaneous QHS  . insulin aspart  0-9 Units Subcutaneous TID WC  . insulin aspart  19 Units Subcutaneous TID WC  . insulin glargine  12 Units  Subcutaneous QHS  . Ipratropium-Albuterol  1 puff Inhalation Q6H  . loratadine  10 mg Oral Daily  . multivitamin with minerals  1 tablet Oral Daily  . predniSONE  60 mg Oral Q breakfast  . rOPINIRole  0.25 mg Oral QHS   Continuous Infusions: . sodium chloride 20 mL (12/18/19 1006)     LOS: 13 days    Time spent: 30 minutes    Gertha Calkin, MD [facility] If 7PM-7AM, please contact night-coverage [email or web address] 12/27/2019, 8:36 AM

## 2019-12-28 ENCOUNTER — Inpatient Hospital Stay: Payer: 59

## 2019-12-28 LAB — BASIC METABOLIC PANEL
Anion gap: 10 (ref 5–15)
BUN: 33 mg/dL — ABNORMAL HIGH (ref 8–23)
CO2: 31 mmol/L (ref 22–32)
Calcium: 8.5 mg/dL — ABNORMAL LOW (ref 8.9–10.3)
Chloride: 105 mmol/L (ref 98–111)
Creatinine, Ser: 0.73 mg/dL (ref 0.44–1.00)
GFR calc Af Amer: >60 mL/min (ref >60)
GFR calc non Af Amer: >60 mL/min (ref >60)
Glucose, Bld: 96 mg/dL (ref 70–99)
Potassium: 4.3 mmol/L (ref 3.5–5.1)
Sodium: 146 mmol/L — ABNORMAL HIGH (ref 135–145)

## 2019-12-28 LAB — GLUCOSE, CAPILLARY
Glucose-Capillary: 154 mg/dL — ABNORMAL HIGH (ref 70–99)
Glucose-Capillary: 104 mg/dL — ABNORMAL HIGH (ref 70–99)
Glucose-Capillary: 120 mg/dL — ABNORMAL HIGH (ref 70–99)
Glucose-Capillary: 167 mg/dL — ABNORMAL HIGH (ref 70–99)

## 2019-12-28 LAB — CBC
HCT: 35.1 % — ABNORMAL LOW (ref 36.0–46.0)
Hemoglobin: 11 g/dL — ABNORMAL LOW (ref 12.0–15.0)
MCH: 28.5 pg (ref 26.0–34.0)
MCHC: 31.3 g/dL (ref 30.0–36.0)
MCV: 90.9 fL (ref 80.0–100.0)
Platelets: 199 10*3/uL (ref 150–400)
RBC: 3.86 MIL/uL — ABNORMAL LOW (ref 3.87–5.11)
RDW: 14.5 % (ref 11.5–15.5)
WBC: 12.1 10*3/uL — ABNORMAL HIGH (ref 4.0–10.5)
nRBC: 0 % (ref 0.0–0.2)

## 2019-12-28 LAB — CREATININE, SERUM
Creatinine, Ser: 0.67 mg/dL (ref 0.44–1.00)
GFR calc Af Amer: >60 mL/min (ref >60)
GFR calc non Af Amer: >60 mL/min (ref >60)

## 2019-12-28 MED ORDER — METHYLPREDNISOLONE SODIUM SUCC 40 MG IJ SOLR
40.0000 mg | Freq: Two times a day (BID) | INTRAMUSCULAR | Status: DC
  Administered 2019-12-28 – 2020-01-01 (×8): 40 mg via INTRAVENOUS
  Filled 2019-12-28 (×8): qty 1

## 2019-12-28 MED ORDER — IPRATROPIUM-ALBUTEROL 0.5-2.5 (3) MG/3ML IN SOLN
3.0000 mL | RESPIRATORY_TRACT | Status: DC | PRN
  Administered 2020-01-02 – 2020-01-03 (×2): 3 mL via RESPIRATORY_TRACT
  Filled 2019-12-28 (×3): qty 3

## 2019-12-28 MED ORDER — SULFAMETHOXAZOLE-TRIMETHOPRIM 400-80 MG PO TABS
1.0000 | ORAL_TABLET | Freq: Two times a day (BID) | ORAL | Status: DC
  Administered 2019-12-28 – 2020-01-05 (×16): 1 via ORAL
  Filled 2019-12-28 (×17): qty 1

## 2019-12-28 MED ORDER — AZATHIOPRINE 50 MG PO TABS
100.0000 mg | ORAL_TABLET | Freq: Every day | ORAL | Status: DC
  Administered 2019-12-28 – 2020-01-03 (×7): 100 mg via ORAL
  Filled 2019-12-28 (×7): qty 2

## 2019-12-28 NOTE — Progress Notes (Signed)
PROGRESS NOTE    Marissa Luna  OAC:[id number] DOB: [birth year] DOA: 01/01/2020  PCP: Carlean Jews, NP    LOS - 14   Brief Narrative:  64 y.o.femalewith medical historyofsarcoidosis and COPD with chronic respiratory failure on2L/min homeO2, hypertension and recently hospitalizedfrom 11/30/19 - 12/30/20withCOVID-19pneumonia andacute on chronicrespiratory failure treated with 5 days of remdesivir and Decadron. She presented to the ED on 1/8with a complaintsof increased cough, weakness and shortness of breath.In the ED, afebrile, mildly tachycardic and tachypneic, hypoxic in 80's on room air. Labs notable for leukocytosis 17k, elevated inflammatory markers including D-dimer of 1572, ferritin 645, CRP 41.6. CT chest showed hazy groundglass opacities seen within both lungs consistent with COVID-19 pneumonia,and interval progression of sarcoidosis compared to 2011. She was started on IV antibiotics, dexamethasone andadmitted tohospitalistservice.  1/23: Patient remains on 7 L high flow nasal cannula.  Reports that her shortness of breath is relatively unchanged over interval.  Appears to have increased work of breathing.  Mentating clearly.  Consultation requested from pulmonologist   Assessment & Plan:   Principal Problem:   Acute on chronic respiratory failure with hypoxia (HCC) Active Problems:   Sarcoidosis of lung (HCC)   Essential hypertension   Weakness   Healthcare-associated pneumonia   History of COVID-19   Acute respiratory failure (HCC)   Multifocal pneumonia   Type 2 diabetes mellitus without complication, without long-term current use of insulin (HCC)   Acute on chronic respiratory failure with hypoxia in setting of recent Covid-19 pneumonia, admitted 12/26 to 12/30. Completed treatment with 5 days remdesivir and 10 days Decadron. Hadbeenusing 2 L/min nocturnaloxygen atbaseline. Increased oxygen requirements 1/13-1/14, up to 40 L/min  HFNC, from8 L/minpreviously. On 1/20 able to be weaned down to 4 L/min (after few days higher dose steroids).   Since 1/20 patient's oxygen requirement is slowly been increasing Low suspicion for acute infection, suspect that background of sarcoid is primary driving factor Plan: Start Solu-Medrol 40 mg IV every 12 hours Add nebulizer every 4 hours as needed Supplemental oxygen as needed, O2 sat greater than 88% Wean oxygen as tolerated Reached out to Dr. Lennette Bihari, patient's pulmonologist.  He indicated that respiratory recovery may be a prolonged process and the patient may benefit from placement in a long-term acute care facility Requested consultation from inpatient pulmonary consultant Dr. Karna Christmas Pulmonary recommendations appreciated [facility] consulted  Bilateral lobar pneumonia In setting of recent COVID-19 pneumonia.  Completed course of Zithromaxand Rocephin.  Sarcoidosis Noted to have progressed since 2011 on CT scan obtained on admission. Appears quite extensive and severe on personal review of CT scan. --Continue steroids as above --Follow with pulmonology  Type 2 diabetes Hyperglycemia - secondary to steroids A1c 8.0%. Not on medications for diabetes as outpatient. --continueLantus 10units at bedtime --NovoLog22units with meals  --continue sensitive slliding scale NovoLog --Should be discharged on an oral medication and follow-up with PCP closely --Carb modified diet --Diabetes coordinator consulted as patient likely to need insulin on discharge given long steroid taper  Back Pain  likely from immobility this admission --heating pad --Norco PRN --Robaxin PRN  Essential hypertension chronic, stable -Continue Norvasc  Restless leg syndrome chronic, stable -Continue Requip  Generalized weakness secondary to recent acute illnesses and underlying comorbidities. --PT eval --Home health PT recommended,TOC consulted to arrange   DVT  prophylaxis:Lovenox Code Status: Full Code Family Communication:none at bedside Disposition Plan:Patient is unsure about going to SNF. I discussed the advantages and safety for her to go to a skilled facility at discharge  and she seems agreeable to go now.   Consultants:  Pulmonology  Procedures:  None  Antimicrobials:  Rocephincompleted  Zithromax completed   Objective: Vitals:   12/27/19 0414 12/27/19 1146 12/27/19 2045 12/28/19 0440  BP: (!) 130/59 128/64 121/63 131/85  Pulse: (!) 103 (!) 108 (!) 108 (!) 101  Resp: 16 18 20 20   Temp: 98.3 F (36.8 C) 97.6 F (36.4 C) 98.3 F (36.8 C) 98.3 F (36.8 C)  TempSrc: Oral Oral Oral Oral  SpO2: 95% 100% 95% 97%  Weight:      Height:        Intake/Output Summary (Last 24 hours) at 12/28/2019 1450 Last data filed at 12/27/2019 2310 Gross per 24 hour  Intake 120 ml  Output 1100 ml  Net -980 ml   Filed Weights   12/30/2019 2252 12/24/19 1552  Weight: 71.2 kg 70.6 kg    Examination:  General exam: Alert, awake, oriented x 3 Respiratory system: bilateral crackles. Respiratory effort normal. Cardiovascular system:RRR. No murmurs, rubs, gallops. Gastrointestinal system: Abdomen is nondistended, soft and nontender. No organomegaly or masses felt. Normal bowel sounds heard. Central nervous system: Alert and oriented. No focal neurological deficits. Extremities: No C/C/E, +pedal pulses Skin: No rashes, lesions or ulcers Psychiatry: Judgement and insight appear normal. Mood & affect appropriate.    Data Reviewed: I have personally reviewed following labs and imaging studies  CBC: Recent Labs  Lab 12/22/19 0637 12/23/19 0638 12/26/19 0456 12/28/19 0509  WBC 15.8* 11.4* 14.6* 12.1*  NEUTROABS 14.4* 10.3* 12.6*  --   HGB 12.3 11.4* 12.1 11.0*  HCT 39.5 36.8 38.9 35.1*  MCV 92.3 91.1 91.5 90.9  PLT 127* 113* 178 199   Basic Metabolic Panel: Recent Labs  Lab 12/22/19 0637 12/23/19 0638  12/26/19 0456 12/27/19 0523 12/28/19 0509  NA 143 140 145 145 146*  K 4.7 4.4 4.5 5.0 4.3  CL 105 101 103 105 105  CO2 28 29 31 30 31   GLUCOSE 239* 253* 97 185* 96  BUN 47* 42* 31* 39* 33*  CREATININE 1.02* 0.90 0.65 0.94 0.73  0.67  CALCIUM 8.6* 8.2* 8.5* 8.3* 8.5*  MG 2.6* 2.4  --   --   --    GFR: Estimated Creatinine Clearance: 60.8 mL/min (by C-G formula based on SCr of 0.67 mg/dL). Liver Function Tests: No results for input(s): AST, ALT, ALKPHOS, BILITOT, PROT, ALBUMIN in the last 168 hours. No results for input(s): LIPASE, AMYLASE in the last 168 hours. No results for input(s): AMMONIA in the last 168 hours. Coagulation Profile: No results for input(s): INR, PROTIME in the last 168 hours. Cardiac Enzymes: No results for input(s): CKTOTAL, CKMB, CKMBINDEX, TROPONINI in the last 168 hours. BNP (last 3 results) No results for input(s): PROBNP in the last 8760 hours. HbA1C: No results for input(s): HGBA1C in the last 72 hours. CBG: Recent Labs  Lab 12/27/19 1701 12/27/19 1859 12/27/19 2219 12/28/19 0758 12/28/19 1139  GLUCAP 65* 231* 331* 154* 104*   Lipid Profile: No results for input(s): CHOL, HDL, LDLCALC, TRIG, CHOLHDL, LDLDIRECT in the last 72 hours. Thyroid Function Tests: No results for input(s): TSH, T4TOTAL, FREET4, T3FREE, THYROIDAB in the last 72 hours. Anemia Panel: No results for input(s): VITAMINB12, FOLATE, FERRITIN, TIBC, IRON, RETICCTPCT in the last 72 hours. Sepsis Labs: No results for input(s): PROCALCITON, LATICACIDVEN in the last 168 hours.  No results found for this or any previous visit (from the past 240 hour(s)).       Radiology  Studies: DG Chest Port 1 View  Result Date: 12/28/2019 CLINICAL DATA:  Acute on chronic respiratory failure with hypoxia secondary to COVID. COVID positive on 11/30/2019. EXAM: PORTABLE CHEST 1 VIEW COMPARISON:  Chest x-ray dated 12-20-19. FINDINGS: Persistent opacities within the mid/upper lungs  bilaterally, compatible with previously described COVID pneumonia, superimposed on extensive chronic interstitial lung disease/fibrosis. No appreciable change compared to the most recent chest x-ray of 12/20/2019. No pleural effusion or pneumothorax is seen. Heart size and mediastinal contours are stable. No acute or suspicious osseous finding. IMPRESSION: Stable chest x-ray. Persistent opacities within the mid/upper lungs bilaterally, compatible with previously described COVID pneumonia superimposed on extensive chronic interstitial lung disease/fibrosis. Electronically Signed   By: Franki Cabot M.D.   On: 12/28/2019 12:54        Scheduled Meds: . amLODipine  10 mg Oral Daily  . ascorbic acid  500 mg Oral Daily  . calcium-vitamin D  1 tablet Oral Daily  . enoxaparin (LOVENOX) injection  40 mg Subcutaneous Q24H  . feeding supplement (ENSURE ENLIVE)  237 mL Oral BID BM  . fluticasone  2 spray Each Nare Daily  . fluticasone furoate-vilanterol  1 puff Inhalation Daily  . folic acid  1 mg Oral Daily  . furosemide  20 mg Oral Daily  . insulin aspart  0-5 Units Subcutaneous QHS  . insulin aspart  0-9 Units Subcutaneous TID WC  . insulin aspart  19 Units Subcutaneous TID WC  . insulin glargine  12 Units Subcutaneous QHS  . Ipratropium-Albuterol  1 puff Inhalation Q6H  . loratadine  10 mg Oral Daily  . methylPREDNISolone (SOLU-MEDROL) injection  40 mg Intravenous Q12H  . multivitamin with minerals  1 tablet Oral Daily  . rOPINIRole  0.25 mg Oral QHS   Continuous Infusions: . sodium chloride 20 mL (12/18/19 1006)     LOS: 14 days    Time spent: 30 minutes    Sidney Ace, MD [facility] If 7PM-7AM, please contact night-coverage [email or web address] 12/28/2019, 2:50 PM

## 2019-12-28 NOTE — Consult Note (Signed)
Pulmonary Medicine          Date: 12/28/2019,   MRN# [id number] Marissa Luna [birth year]     AdmissionWeight: 71.2 kg                 CurrentWeight: 70.6 kg      CHIEF COMPLAINT:   Increased oxygen requirement status post COVID-19 pneumonia   HISTORY OF PRESENT ILLNESS   This is a very pleasant 64 year old female with a history of pulmonary and cutaneous sarcoidosis, chronic hypoxemia usually on 2 L/min nasal cannula at home, allergic rhinitis, history of influenza A 1 year ago, history of COPD with asthma overlap, chronic kidney disease, was hospitalized with COVID-19 pneumonia and admitted 11/30/2019 discharged 12/04/2019, while hospitalized she required oxygen however was able to be weaned down to her home setting at 2 L/min nasal cannula, she received 5 days of remdesivir and had received IV steroids while inpatient with additional 5 days of dexamethasone prescribed on discharge, that has returned to her home dose of prednisone 10 mg p.o. daily.  On 12/14/2019 she returned back to the ED with complaints of worsening hypoxemia and cough, she was treated with IV antibiotics including Rocephin, vancomycin and azithromycin as well as more dexamethasone.  She has had numerous chest imaging over the last month including CT and x-rays with serial pictorial documentation included below.  Patient is no longer couging due to codiene cough syrup but she did cough with report of brown phlegm on expectoration.   PAST MEDICAL HISTORY   Past Medical History:  Diagnosis Date  . Asthma   . Environmental allergies   . Hypertension   . Sarcoidosis      SURGICAL HISTORY   Past Surgical History:  Procedure Laterality Date  . CHOLECYSTECTOMY    . COLONOSCOPY N/A 10/26/2015   Procedure: COLONOSCOPY;  Surgeon: Wallace Cullens, MD;  Location: [facility] ENDOSCOPY;  Service: Gastroenterology;  Laterality: N/A;     FAMILY HISTORY   Family History  Problem Relation Age of Onset  . Lung cancer  Mother   . Colon cancer Father   . Diabetes Sister   . Breast cancer Sister      SOCIAL HISTORY   Social History   Tobacco Use  . Smoking status: Never Smoker  . Smokeless tobacco: Never Used  Substance Use Topics  . Alcohol use: No  . Drug use: No     MEDICATIONS    Home Medication:  Current Outpatient Rx  . Order #: [id number] Class: No Print  . Order #: [id number] Class: No Print  . Order #: [id number] Class: No Print  . Order #: [id number] Class: No Print  . Order #: [id number] Class: No Print  . Order #: [id number] Class: No Print  . Order #: [id number] Class: No Print  . Order #: [id number] Class: No Print  . Order #: [id number] Class: No Print  . Order #: [id number] Class: No Print  . Order #: [id number] Class: No Print  . Order #: [id number] Class: No Print  . Order #: [id number] Class: No Print  . Order #: [id number] Class: No Print  . Order #: [id number] Class: No Print  . Order #: [id number] Class: No Print  . Order #: [id number] Class: No Print  . Order #: [id number] Class: No Print    Current Medication:  Current Facility-Administered Medications:  .  0.9 %  sodium chloride infusion, , Intravenous, PRN, Alford Highland, MD, Last Rate: 5 mL/hr at 12/18/19 1006, 20 mL at 12/18/19 1006 .  acetaminophen (TYLENOL) tablet 650 mg, 650 mg, Oral, Q4H  PRN, [name], NP, 650 mg at 12/23/19 1747 .  amLODipine (NORVASC) tablet 10 mg, 10 mg, Oral, Daily, Renae Gloss, Richard, MD, 10 mg at 12/28/19 0902 .  ascorbic acid (VITAMIN C) tablet 500 mg, 500 mg, Oral, Daily, Renae Gloss, Richard, MD, 500 mg at 12/28/19 0902 .  calcium-vitamin D (OSCAL WITH D) 500-200 MG-UNIT per tablet 1 tablet, 1 tablet, Oral, Daily, Alford Highland, MD, 1 tablet at 12/28/19 0904 .  enoxaparin (LOVENOX) injection 40 mg, 40 mg, Subcutaneous, Q24H, Wieting, Richard, MD, 40 mg at 12/27/19 1905 .  feeding supplement (ENSURE ENLIVE) (ENSURE ENLIVE) liquid 237 mL, 237 mL, Oral, BID BM, Renae Gloss, Richard, MD, 237 mL at 12/28/19  0935 .  fluticasone (FLONASE) 50 MCG/ACT nasal spray 2 spray, 2 spray, Each Nare, Daily, Alford Highland, MD, 2 spray at 12/28/19 0911 .  fluticasone furoate-vilanterol (BREO ELLIPTA) 100-25 MCG/INH 1 puff, 1 puff, Inhalation, Daily, 1 puff at 12/28/19 0911 **AND** [DISCONTINUED] umeclidinium bromide (INCRUSE ELLIPTA) 62.5 MCG/INH 1 puff, 1 puff, Inhalation, Daily, Alford Highland, MD, 1 puff at 12/24/19 0958 .  folic acid (FOLVITE) tablet 1 mg, 1 mg, Oral, Daily, Renae Gloss, Richard, MD, 1 mg at 12/28/19 0902 .  furosemide (LASIX) tablet 20 mg, 20 mg, Oral, Daily, Renae Gloss, Richard, MD, 20 mg at 12/28/19 0902 .  guaiFENesin-dextromethorphan (ROBITUSSIN DM) 100-10 MG/5ML syrup 10 mL, 10 mL, Oral, Q4H PRN, Alford Highland, MD, 10 mL at 12/27/19 2249 .  HYDROcodone-acetaminophen (NORCO/VICODIN) 5-325 MG per tablet 1-2 tablet, 1-2 tablet, Oral, Q6H PRN, Pennie Banter, DO, 2 tablet at 12/28/19 0934 .  insulin aspart (novoLOG) injection 0-5 Units, 0-5 Units, Subcutaneous, QHS, Alford Highland, MD, 4 Units at 12/27/19 2227 .  insulin aspart (novoLOG) injection 0-9 Units, 0-9 Units, Subcutaneous, TID WC, Alford Highland, MD, 2 Units at 12/28/19 0900 .  insulin aspart (novoLOG) injection 19 Units, 19 Units, Subcutaneous, TID WC, Pennie Banter, DO, 19 Units at 12/28/19 1252 .  insulin glargine (LANTUS) injection 12 Units, 12 Units, Subcutaneous, QHS, Pennie Banter, DO, 12 Units at 12/27/19 2228 .  Ipratropium-Albuterol (COMBIVENT) respimat 1 puff, 1 puff, Inhalation, Q6H, Wieting, Richard, MD, 1 puff at 12/28/19 1254 .  ipratropium-albuterol (DUONEB) 0.5-2.5 (3) MG/3ML nebulizer solution 3 mL, 3 mL, Nebulization, Q4H PRN, Sreenath, Sudheer B, MD .  loratadine (CLARITIN) tablet 10 mg, 10 mg, Oral, Daily, Renae Gloss, Richard, MD, 10 mg at 12/28/19 0904 .  methocarbamol (ROBAXIN) tablet 1,000 mg, 1,000 mg, Oral, Q8H PRN, Esaw Grandchild A, DO, 1,000 mg at 12/27/19 1125 .  methylPREDNISolone sodium  succinate (SOLU-MEDROL) 40 mg/mL injection 40 mg, 40 mg, Intravenous, Q12H, Sreenath, Sudheer B, MD, 40 mg at 12/28/19 1253 .  multivitamin with minerals tablet 1 tablet, 1 tablet, Oral, Daily, Alford Highland, MD, 1 tablet at 12/28/19 0902 .  rOPINIRole (REQUIP) tablet 0.25 mg, 0.25 mg, Oral, QHS, Wieting, Richard, MD, 0.25 mg at 12/27/19 2112 .  sodium chloride (OCEAN) 0.65 % nasal spray 1 spray, 1 spray, Each Nare, PRN, Alford Highland, MD    ALLERGIES   Patient has no known allergies.     REVIEW OF SYSTEMS    Review of Systems:  Gen:  Denies  fever, sweats, chills weigh loss  HEENT: Denies blurred vision, double vision, ear pain, eye pain, hearing loss, nose bleeds, sore throat Cardiac:  No dizziness, chest pain or heaviness, chest tightness,edema Resp:   +cough or sputum porduction, shortness of breath, Gi: Denies swallowing difficulty, stomach pain, nausea or vomiting, diarrhea, constipation, bowel incontinence Gu:  Denies bladder incontinence, burning urine Ext:   Denies Joint pain, stiffness or swelling Skin: Denies  skin rash, easy bruising or bleeding or hives Endoc:  Denies polyuria, polydipsia , polyphagia or weight change Psych:   Denies depression, insomnia or hallucinations   Other:  All other systems negative   VS: BP 131/85 (BP Location: Left Arm)   Pulse (!) 101   Temp 98.3 F (36.8 C) (Oral)   Resp 20   Ht 4\' 11"  (1.499 m)   Wt 70.6 kg   SpO2 97%   BMI 31.45 kg/m      PHYSICAL EXAM    GENERAL:NAD, no fevers, chills, no weakness no fatigue HEAD: Normocephalic, atraumatic.  EYES: Pupils equal, round, reactive to light. Extraocular muscles intact. No scleral icterus.  MOUTH: Moist mucosal membrane. Dentition intact. No abscess noted.  EAR, NOSE, THROAT: Clear without exudates. No external lesions.  NECK: Supple. No thyromegaly. No nodules. No JVD.  PULMONARY: mildly rhonchorous breath sounds with decraesed air entry b/l CARDIOVASCULAR: S1 and  S2. Regular rate and rhythm. No murmurs, rubs, or gallops. No edema. Pedal pulses 2+ bilaterally.  GASTROINTESTINAL: Soft, nontender, nondistended. No masses. Positive bowel sounds. No hepatosplenomegaly.  MUSCULOSKELETAL: No swelling, clubbing, or edema. Range of motion full in all extremities.  NEUROLOGIC: Cranial nerves II through XII are intact. No gross focal neurological deficits. Sensation intact. Reflexes intact.  SKIN: No ulceration, lesions, rashes, or cyanosis. Skin warm and dry. Turgor intact.  PSYCHIATRIC: Mood, affect within normal limits. The patient is awake, alert and oriented x 3. Insight, judgment intact.       IMAGING    DG Chest 2 View  Result Date: 11/30/2019 CLINICAL DATA:  Shortness of breath and cough. EXAM: CHEST - 2 VIEW COMPARISON:  February 12, 2018 FINDINGS: The mediastinal contour and cardiac silhouette are normal. Patchy opacities are identified throughout bilateral lungs. 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 No acute abnormalities identified in visualized bony structures. IMPRESSION: 1. Patchy opacities are identified throughout bilateral lungs suspicious for pneumonia. 2. 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 Electronically Signed   By: Abelardo Diesel M.D.   On: 11/30/2019 08:43   CT Angio Chest PE W and/or Wo Contrast  Result Date: 12/14/2019 CLINICAL DATA:  Shortness of breath EXAM: CT ANGIOGRAPHY CHEST WITH CONTRAST TECHNIQUE: Multidetector CT imaging of the chest was performed using the standard protocol during bolus administration of intravenous contrast. Multiplanar CT image reconstructions and MIPs were obtained to evaluate the vascular anatomy. CONTRAST:  167mL OMNIPAQUE IOHEXOL 350 MG/ML SOLN COMPARISON:  Apr 22, 2010 FINDINGS: Cardiovascular: There is a optimal opacification of the pulmonary arteries. There is no central,segmental, or subsegmental filling defects within the pulmonary arteries. There is  mild cardiomegaly. No evidence of right ventricular heart strain. There is normal three-vessel brachiocephalic anatomy without proximal stenosis. The thoracic aorta is normal in appearance. Mediastinum/Nodes: Again noted are extensively calcified mediastinal and bilateral hilar lymph nodes. Thyroid gland, trachea, and esophagus demonstrate no significant findings. Lungs/Pleura: There is extensive bilateral bronchial wall thickening and bronchiectasis with honeycombing most notable in the posterior lower lungs with subpleural bleb formation. There has been interval progression since the prior exam dating back to 20/11. There is new hazy/ground-glass opacity seen within the periphery of the bilateral lungs, left slightly greater than right. No pleural effusion is seen. Upper Abdomen: No acute abnormalities present in the visualized portions of the upper abdomen. Musculoskeletal: No chest wall abnormality. No acute or significant  osseous findings. Review of the MIP images confirms the above findings. IMPRESSION: 1. No central, segmental, or subsegmental pulmonary embolism. 2. Extensive bilateral hilar and mediastinal calcified adenopathy with extensive posterior lower lung bronchiectasis, honeycombing, and subpleural bleb formation. There has been interval progression in the lung changes since the prior exam, consistent with progression of the patient's known sarcoidosis. 3. Hazy/ground-glass opacities seen within both lungs, left greater than right, consistent with COVID pneumonia. 4. Mild cardiomegaly Electronically Signed   By: Jonna ClarkBindu  Avutu M.D.   On: 12/14/2019 01:18   DG Chest Port 1 View  Result Date: 12/28/2019 CLINICAL DATA:  Acute on chronic respiratory failure with hypoxia secondary to COVID. COVID positive on 11/30/2019. EXAM: PORTABLE CHEST 1 VIEW COMPARISON:  Chest x-ray dated Sep 09, 2020. FINDINGS: Persistent opacities within the mid/upper lungs bilaterally, compatible with previously described COVID  pneumonia, superimposed on extensive chronic interstitial lung disease/fibrosis. No appreciable change compared to the most recent chest x-ray of Sep 09, 2020. No pleural effusion or pneumothorax is seen. Heart size and mediastinal contours are stable. No acute or suspicious osseous finding. IMPRESSION: Stable chest x-ray. Persistent opacities within the mid/upper lungs bilaterally, compatible with previously described COVID pneumonia superimposed on extensive chronic interstitial lung disease/fibrosis. Electronically Signed   By: Bary RichardStan  Maynard M.D.   On: 12/28/2019 12:54   DG Chest Port 1 View  Result Date: 12/10/2019 CLINICAL DATA:  64 year old female with shortness of breath. EXAM: PORTABLE CHEST 1 VIEW COMPARISON:  Chest radiograph dated 11/30/2019. FINDINGS: Background of fibrotic changes primarily involving the left mid to lower lung field. Slight increased density primarily over the left lung may be chronic but appears more prominent compared to the prior radiograph and developing infiltrate is not excluded. Clinical correlation is recommended. No pleural effusion or pneumothorax. The cardiac silhouette is within normal limits. Calcified hilar and mediastinal granuloma. No acute osseous pathology. IMPRESSION: 1. Slight increased density primarily in the left mid to lower lung field. Developing infiltrate is not excluded. Clinical correlation is recommended. 2. Calcified hilar and mediastinal granuloma. Electronically Signed   By: Elgie CollardArash  Radparvar M.D.   On: Sep 09, 2020 23:28    Below images are independently reviewed by me.           - - CT CHEST - interval progression of sarcoidosis stage 4 with worsening pulmonary fibrosis bilaterally over 10 years     -Chest x-ray-compared to December 26 current CXR with worsening airspace opacification noted at left upper lobe and lower peripherally as well as right middle lobe.  ASSESSMENT/PLAN   Acute on chronic hypoxemic respiratory failure  - patient  has had improvement with recurrent decline on short interval follow up with re-admission 2 times in last 30 days - some concern for opportunistic etiology due to relative immunocompromised state from chronic steroids and recent multiple bursts of high dose IV and PO steroids - She has no peripheral edema and very mild bibasilar crackles pointing to less likely interstitial edema as cause - Specifically she may develop PJP and will need at minimum bactrim for PPX - will order beta-D-glucan serology and fungal ab workup for now - discussed bronchoscopy for airway inspection and BAL to evaluate for yeastform/hyphae, microbiology cultures and staining - patient is agreeable - there is decreased air entry - may need recruitment maneuvers - will add MetaNEB   Pulmonary Sarcoidosis Stage IV  - despite chronic gluccocorticoids patients fibrosis seems to be progressing as evidenced by serial imaging above - we have discussed adding steroid sparing immunomodulator and patient  is agreeable - will start Imuran today  - patient is advised to review changes with primary pulmonologist - Dr Forrest Moron to see if he is in agreement and any further recommendations   Asthma and COPD overlap syndrome (ACOS)   - patient currently on solumedrol - 40 q 12h - will plan to wean as able  - agree with DuoNEb  - combivent MDI , breo-ellipta   -encourage use of IS and participate in PT/OT          Thank you for allowing me to participate in the care of this patient.   Patient/Family are satisfied with care plan and all questions have been answered.  This document was prepared using Dragon voice recognition software and may include unintentional dictation errors.     Vida Rigger, M.D.  Division of Pulmonary & Critical Care Medicine  [facility]

## 2019-12-29 LAB — GLUCOSE, CAPILLARY
Glucose-Capillary: 354 mg/dL — ABNORMAL HIGH (ref 70–99)
Glucose-Capillary: 312 mg/dL — ABNORMAL HIGH (ref 70–99)
Glucose-Capillary: 145 mg/dL — ABNORMAL HIGH (ref 70–99)
Glucose-Capillary: 73 mg/dL (ref 70–99)

## 2019-12-29 MED ORDER — FAMOTIDINE 20 MG PO TABS
20.0000 mg | ORAL_TABLET | Freq: Every day | ORAL | Status: DC
  Administered 2019-12-29 – 2020-01-05 (×8): 20 mg via ORAL
  Filled 2019-12-29 (×8): qty 1

## 2019-12-29 NOTE — Progress Notes (Addendum)
PROGRESS NOTE    Marissa Luna  FTD:[id number] DOB: 1956-01-02 DOA: 12/12/2019  PCP: Ronnell Freshwater, NP    LOS - 15   Brief Narrative:  64 y.o.femalewith medical historyofsarcoidosis and COPD with chronic respiratory failure on2L/min homeO2, hypertension and recently hospitalizedfrom 11/30/19 - 12/30/20withCOVID-19pneumonia andacute on chronicrespiratory failure treated with 5 days of remdesivir and Decadron. She presented to the ED on 1/8with a complaintsof increased cough, weakness and shortness of breath.In the ED, afebrile, mildly tachycardic and tachypneic, hypoxic in 80's on room air. Labs notable for leukocytosis 17k, elevated inflammatory markers including D-dimer of 1572, ferritin 645, CRP 41.6. CT chest showed hazy groundglass opacities seen within both lungs consistent with COVID-19 pneumonia,and interval progression of sarcoidosis compared to 2011. She was started on IV antibiotics, dexamethasone andadmitted tohospitalistservice.    Assessment & Plan:   Principal Problem:   Acute on chronic respiratory failure with hypoxia (HCC) Active Problems:   Sarcoidosis of lung (HCC)   Essential hypertension   Weakness   Healthcare-associated pneumonia   History of COVID-19   Acute respiratory failure (HCC)   Multifocal pneumonia   Type 2 diabetes mellitus without complication, without long-term current use of insulin (HCC)   Acute on chronic respiratory failure with hypoxia in setting of recent Covid-19 pneumonia, admitted 12/26 to 12/30. Completed treatment with 5 days remdesivir and 10 days Decadron. Hadbeenusing 2 L/min nocturnaloxygen atbaseline. Increased oxygen requirements 1/13-1/14, up to 40 L/min HFNC, from8 L/minpreviously. On 1/20 able to be weaned down to 4 L/min (after few days higher dose steroids).   Since 1/20 patient's oxygen requirement is slowly been increasing Low suspicion for acute infection, suspect that background  of sarcoid is primary driving factor Per pulmonology there is some concern opportunistic etiology due to chronic steroid, started on Bactrim prophylaxis Started on Imuran Continue IV steroids beta-D-glucan serology and fungal ab workup ordered Bronchoscopy was discussed with the patient for airway inspection and BAL to evaluate for yeastform/hyphae, microbiology cultures and staining -patient has agreed Pulmonology added MetaNEB Per previous hospitalist note Dr. Yancey Flemings, patient's pulmonologist indicated that respiratory recovery may be a prolonged process and the patient may benefit from placement in a long-term acute care facility   Bilateral lobar pneumonia In setting of recent COVID-19 pneumonia.  Completed course of Zithromaxand Rocephin.   Asthma and COPD overlap syndrome Continue on Solu-Medrol 40 mg twice daily DuoNeb Combivent MDI and Breo Ellipta Incentive spirometer PT  Sarcoidosis Noted to have progressed since 2011 on CT scan obtained on admission. Appears quite extensive and severe on personal review of CT scan. --Continue steroids as above --Follow with pulmonology see above  Type 2 diabetes Hyperglycemia - secondary to steroids A1c 8.0%. Not on medications for diabetes as outpatient. --continueLantus 12units at bedtime --NovoLog19units with TID with meals  - ck fs  , riss --Should be discharged on an oral medication and follow-up with PCP closely --Carb modified diet --Diabetes coordinator consulted as patient likely to need insulin on discharge given long steroid taper?  Back Pain  likely from immobility this admission --heating pad --Norco PRN --Robaxin PRN  Essential hypertension chronic, stable -Continue Norvasc  Restless leg syndrome chronic, stable -Continue Requip  Generalized weakness secondary to recent acute illnesses and underlying comorbidities. --PT eval --Home health PT recommended,TOC consulted to arrange   DVT  prophylaxis:Lovenox Code Status: Full Code Family Communication:none at bedside Disposition Plan:SNF pending, medically not ready as she is on IV steroids and quite breathless still, will need to continue current medical  management until we are able to wean her oxygen down and she is less short of breath   Consultants:  Pulmonology  Procedures:  None  Antimicrobials:  Rocephincompleted  Zithromax completed  SUBJECTIVE: Becomes breathless with talking. No cp, dizziness, or other new complaints  Objective: Vitals:   12/28/19 1609 12/28/19 2058 12/29/19 0530 12/29/19 1149  BP: 116/69 123/65 130/76 (!) 132/59  Pulse: (!) 108 94 96 (!) 118  Resp: 20 20 20 20   Temp: (!) 97.4 F (36.3 C) (!) 97.5 F (36.4 C) 97.9 F (36.6 C) 98.1 F (36.7 C)  TempSrc: Oral Oral Oral Axillary  SpO2: 97% 100% 98% 97%  Weight:      Height:        Intake/Output Summary (Last 24 hours) at 12/29/2019 1737 Last data filed at 12/29/2019 1600 Gross per 24 hour  Intake 120 ml  Output 1300 ml  Net -1180 ml   Filed Weights   01/04/2020 2252 12/24/19 1552  Weight: 71.2 kg 70.6 kg    Examination:  General exam: becomes easily breathless when talking Respiratory system: bilateral scattered crackles.  No wheezing cardiovascular system:RRR. No murmurs, rubs, gallops. Gastrointestinal system: Abdomen is nondistended, soft and nontender.  Normal bowel sounds heard. Central nervous system: Alert and oriented. No focal neurological deficits. Extremities: No edema Skin: Warm dry Psychiatry: Judgement and insight appear normal. Mood & affect appropriate.    Data Reviewed: I have personally reviewed following labs and imaging studies  CBC: Recent Labs  Lab 12/23/19 0638 12/26/19 0456 12/28/19 0509  WBC 11.4* 14.6* 12.1*  NEUTROABS 10.3* 12.6*  --   HGB 11.4* 12.1 11.0*  HCT 36.8 38.9 35.1*  MCV 91.1 91.5 90.9  PLT 113* 178 199   Basic Metabolic Panel: Recent Labs  Lab  12/23/19 0638 12/26/19 0456 12/27/19 0523 12/28/19 0509  NA 140 145 145 146*  K 4.4 4.5 5.0 4.3  CL 101 103 105 105  CO2 29 31 30 31   GLUCOSE 253* 97 185* 96  BUN 42* 31* 39* 33*  CREATININE 0.90 0.65 0.94 0.73  0.67  CALCIUM 8.2* 8.5* 8.3* 8.5*  MG 2.4  --   --   --    GFR: Estimated Creatinine Clearance: 60.8 mL/min (by C-G formula based on SCr of 0.67 mg/dL). Liver Function Tests: No results for input(s): AST, ALT, ALKPHOS, BILITOT, PROT, ALBUMIN in the last 168 hours. No results for input(s): LIPASE, AMYLASE in the last 168 hours. No results for input(s): AMMONIA in the last 168 hours. Coagulation Profile: No results for input(s): INR, PROTIME in the last 168 hours. Cardiac Enzymes: No results for input(s): CKTOTAL, CKMB, CKMBINDEX, TROPONINI in the last 168 hours. BNP (last 3 results) No results for input(s): PROBNP in the last 8760 hours. HbA1C: No results for input(s): HGBA1C in the last 72 hours. CBG: Recent Labs  Lab 12/28/19 1640 12/28/19 2106 12/29/19 0736 12/29/19 1147 12/29/19 1622  GLUCAP 120* 167* 354* 312* 145*   Lipid Profile: No results for input(s): CHOL, HDL, LDLCALC, TRIG, CHOLHDL, LDLDIRECT in the last 72 hours. Thyroid Function Tests: No results for input(s): TSH, T4TOTAL, FREET4, T3FREE, THYROIDAB in the last 72 hours. Anemia Panel: No results for input(s): VITAMINB12, FOLATE, FERRITIN, TIBC, IRON, RETICCTPCT in the last 72 hours. Sepsis Labs: No results for input(s): PROCALCITON, LATICACIDVEN in the last 168 hours.  No results found for this or any previous visit (from the past 240 hour(s)).       Radiology Studies: [facility] Chest Port 1  View  Result Date: 12/28/2019 CLINICAL DATA:  Acute on chronic respiratory failure with hypoxia secondary to COVID. COVID positive on 11/30/2019. EXAM: PORTABLE CHEST 1 VIEW COMPARISON:  Chest x-ray dated 2019-12-14. FINDINGS: Persistent opacities within the mid/upper lungs bilaterally, compatible with  previously described COVID pneumonia, superimposed on extensive chronic interstitial lung disease/fibrosis. No appreciable change compared to the most recent chest x-ray of Dec 14, 2019. No pleural effusion or pneumothorax is seen. Heart size and mediastinal contours are stable. No acute or suspicious osseous finding. IMPRESSION: Stable chest x-ray. Persistent opacities within the mid/upper lungs bilaterally, compatible with previously described COVID pneumonia superimposed on extensive chronic interstitial lung disease/fibrosis. Electronically Signed   By: Bary Richard M.D.   On: 12/28/2019 12:54        Scheduled Meds: . amLODipine  10 mg Oral Daily  . ascorbic acid  500 mg Oral Daily  . azaTHIOprine  100 mg Oral Daily  . calcium-vitamin D  1 tablet Oral Daily  . enoxaparin (LOVENOX) injection  40 mg Subcutaneous Q24H  . feeding supplement (ENSURE ENLIVE)  237 mL Oral BID BM  . fluticasone  2 spray Each Nare Daily  . fluticasone furoate-vilanterol  1 puff Inhalation Daily  . folic acid  1 mg Oral Daily  . furosemide  20 mg Oral Daily  . insulin aspart  0-5 Units Subcutaneous QHS  . insulin aspart  0-9 Units Subcutaneous TID WC  . insulin aspart  19 Units Subcutaneous TID WC  . insulin glargine  12 Units Subcutaneous QHS  . Ipratropium-Albuterol  1 puff Inhalation Q6H  . loratadine  10 mg Oral Daily  . methylPREDNISolone (SOLU-MEDROL) injection  40 mg Intravenous Q12H  . multivitamin with minerals  1 tablet Oral Daily  . rOPINIRole  0.25 mg Oral QHS  . sulfamethoxazole-trimethoprim  1 tablet Oral Q12H   Continuous Infusions: . sodium chloride 20 mL (12/18/19 1006)     LOS: 15 days    Time spent: 45 minutes with more than 50% COC    Lynn Ito, MD [facility] If 7PM-7AM, please contact night-coverage [email or web address] 12/29/2019, 5:37 PM  Patient ID: Abigail Miyamoto, female   DOB: [birth year], 64 y.o.   MRN: [id number]

## 2019-12-29 NOTE — Progress Notes (Signed)
Heart rate running 118. Dr Marylu Lund informed. No new orders at this time

## 2019-12-29 NOTE — Consult Note (Signed)
Pulmonary Medicine          Date: 12/29/2019,   MRN# [id number] Marissa Luna 65/04/1956     AdmissionWeight: 71.2 kg                 CurrentWeight: 70.6 kg      CHIEF COMPLAINT:   Increased oxygen requirement status post COVID-19 pneumonia   SUBJECTIVE   Patient reports feeling similar to yesterday "short winded".  No changes to plan today continue bactrim, prednisone, Imuran.   PAST MEDICAL HISTORY   Past Medical History:  Diagnosis Date  . Asthma   . Environmental allergies   . Hypertension   . Sarcoidosis      SURGICAL HISTORY   Past Surgical History:  Procedure Laterality Date  . CHOLECYSTECTOMY    . COLONOSCOPY N/A 10/26/2015   Procedure: COLONOSCOPY;  Surgeon: Wallace Cullens, MD;  Location: [facility] ENDOSCOPY;  Service: Gastroenterology;  Laterality: N/A;     FAMILY HISTORY   Family History  Problem Relation Age of Onset  . Lung cancer Mother   . Colon cancer Father   . Diabetes Sister   . Breast cancer Sister      SOCIAL HISTORY   Social History   Tobacco Use  . Smoking status: Never Smoker  . Smokeless tobacco: Never Used  Substance Use Topics  . Alcohol use: No  . Drug use: No     MEDICATIONS    Home Medication:  Current Outpatient Rx  . Order #: [id number] Class: No Print  . Order #: [id number] Class: No Print  . Order #: [id number] Class: No Print  . Order #: [id number] Class: No Print  . Order #: [id number] Class: No Print  . Order #: [id number] Class: No Print  . Order #: [id number] Class: No Print  . Order #: [id number] Class: No Print  . Order #: [id number] Class: No Print  . Order #: [id number] Class: No Print  . Order #: [id number] Class: No Print  . Order #: [id number] Class: No Print  . Order #: [id number] Class: No Print  . Order #: [id number] Class: No Print  . Order #: [id number] Class: No Print  . Order #: [id number] Class: No Print  . Order #: [id number] Class: No Print  . Order #: [id number] Class: No Print    Current  Medication:  Current Facility-Administered Medications:  .  0.9 %  sodium chloride infusion, , Intravenous, PRN, Alford Highland, MD, Last Rate: 5 mL/hr at 12/18/19 1006, 20 mL at 12/18/19 1006 .  acetaminophen (TYLENOL) tablet 650 mg, 650 mg, Oral, Q4H PRN, [name], NP, 650 mg at 12/23/19 1747 .  amLODipine (NORVASC) tablet 10 mg, 10 mg, Oral, Daily, Renae Gloss, Richard, MD, 10 mg at 12/29/19 0813 .  ascorbic acid (VITAMIN C) tablet 500 mg, 500 mg, Oral, Daily, Renae Gloss, Richard, MD, 500 mg at 12/29/19 8916 .  azaTHIOprine (IMURAN) tablet 100 mg, 100 mg, Oral, Daily, Karna Christmas, Muadh Creasy, MD, 100 mg at 12/29/19 0814 .  calcium-vitamin D (OSCAL WITH D) 500-200 MG-UNIT per tablet 1 tablet, 1 tablet, Oral, Daily, Alford Highland, MD, 1 tablet at 12/29/19 0813 .  enoxaparin (LOVENOX) injection 40 mg, 40 mg, Subcutaneous, Q24H, Wieting, Richard, MD, 40 mg at 12/29/19 1636 .  feeding supplement (ENSURE ENLIVE) (ENSURE ENLIVE) liquid 237 mL, 237 mL, Oral, BID BM, Renae Gloss, Richard, MD, 237 mL at 12/29/19 1239 .  fluticasone (FLONASE) 50 MCG/ACT nasal spray 2 spray, 2 spray, Each Nare, Daily, Alford Highland, MD, 2 spray at 12/29/19 0818 .  fluticasone furoate-vilanterol (BREO ELLIPTA) 100-25 MCG/INH 1 puff,  1 puff, Inhalation, Daily, 1 puff at 12/29/19 0827 **AND** [DISCONTINUED] umeclidinium bromide (INCRUSE ELLIPTA) 62.5 MCG/INH 1 puff, 1 puff, Inhalation, Daily, Loletha Grayer, MD, 1 puff at 12/24/19 0958 .  folic acid (FOLVITE) tablet 1 mg, 1 mg, Oral, Daily, Wieting, Richard, MD, 1 mg at 12/29/19 9563 .  furosemide (LASIX) tablet 20 mg, 20 mg, Oral, Daily, Leslye Peer, Richard, MD, 20 mg at 12/29/19 0824 .  guaiFENesin-dextromethorphan (ROBITUSSIN DM) 100-10 MG/5ML syrup 10 mL, 10 mL, Oral, Q4H PRN, Loletha Grayer, MD, 10 mL at 12/27/19 2249 .  HYDROcodone-acetaminophen (NORCO/VICODIN) 5-325 MG per tablet 1-2 tablet, 1-2 tablet, Oral, Q6H PRN, Ezekiel Slocumb, DO, 2 tablet at 12/28/19 1935 .   insulin aspart (novoLOG) injection 0-5 Units, 0-5 Units, Subcutaneous, QHS, Loletha Grayer, MD, 4 Units at 12/27/19 2227 .  insulin aspart (novoLOG) injection 0-9 Units, 0-9 Units, Subcutaneous, TID WC, Loletha Grayer, MD, 1 Units at 12/29/19 1635 .  insulin aspart (novoLOG) injection 19 Units, 19 Units, Subcutaneous, TID WC, Ezekiel Slocumb, DO, 19 Units at 12/29/19 1636 .  insulin glargine (LANTUS) injection 12 Units, 12 Units, Subcutaneous, QHS, Ezekiel Slocumb, DO, 12 Units at 12/28/19 2156 .  Ipratropium-Albuterol (COMBIVENT) respimat 1 puff, 1 puff, Inhalation, Q6H, Wieting, Richard, MD, 1 puff at 12/29/19 1637 .  ipratropium-albuterol (DUONEB) 0.5-2.5 (3) MG/3ML nebulizer solution 3 mL, 3 mL, Nebulization, Q4H PRN, Sreenath, Sudheer B, MD .  loratadine (CLARITIN) tablet 10 mg, 10 mg, Oral, Daily, Leslye Peer, Richard, MD, 10 mg at 12/29/19 0811 .  methocarbamol (ROBAXIN) tablet 1,000 mg, 1,000 mg, Oral, Q8H PRN, Nicole Kindred A, DO, 1,000 mg at 12/27/19 1125 .  methylPREDNISolone sodium succinate (SOLU-MEDROL) 40 mg/mL injection 40 mg, 40 mg, Intravenous, Q12H, Sreenath, Sudheer B, MD, 40 mg at 12/29/19 1212 .  multivitamin with minerals tablet 1 tablet, 1 tablet, Oral, Daily, Loletha Grayer, MD, 1 tablet at 12/29/19 [phone]  rOPINIRole (REQUIP) tablet 0.25 mg, 0.25 mg, Oral, QHS, Wieting, Richard, MD, 0.25 mg at 12/28/19 2123 .  sodium chloride (OCEAN) 0.65 % nasal spray 1 spray, 1 spray, Each Nare, PRN, Wieting, Richard, MD .  sulfamethoxazole-trimethoprim (BACTRIM) 400-80 MG per tablet 1 tablet, 1 tablet, Oral, Q12H, Ottie Glazier, MD, 1 tablet at 12/29/19 4332    ALLERGIES   Patient has no known allergies.     REVIEW OF SYSTEMS    Review of Systems:  Gen:  Denies  fever, sweats, chills weigh loss  HEENT: Denies blurred vision, double vision, ear pain, eye pain, hearing loss, nose bleeds, sore throat Cardiac:  No dizziness, chest pain or heaviness, chest  tightness,edema Resp:   +cough or sputum porduction, shortness of breath, Gi: Denies swallowing difficulty, stomach pain, nausea or vomiting, diarrhea, constipation, bowel incontinence Gu:  Denies bladder incontinence, burning urine Ext:   Denies Joint pain, stiffness or swelling Skin: Denies  skin rash, easy bruising or bleeding or hives Endoc:  Denies polyuria, polydipsia , polyphagia or weight change Psych:   Denies depression, insomnia or hallucinations   Other:  All other systems negative   VS: BP (!) 132/59 (BP Location: Left Arm)   Pulse (!) 118   Temp 98.1 F (36.7 C) (Axillary)   Resp 20   Ht 4\' 11"  (1.499 m)   Wt 70.6 kg   SpO2 97%   BMI 31.45 kg/m      PHYSICAL EXAM    GENERAL:NAD, no fevers, chills, no weakness no fatigue HEAD: Normocephalic, atraumatic.  EYES: Pupils equal, round, reactive to light.  Extraocular muscles intact. No scleral icterus.  MOUTH: Moist mucosal membrane. Dentition intact. No abscess noted.  EAR, NOSE, THROAT: Clear without exudates. No external lesions.  NECK: Supple. No thyromegaly. No nodules. No JVD.  PULMONARY: mildly rhonchorous breath sounds with decraesed air entry b/l CARDIOVASCULAR: S1 and S2. Regular rate and rhythm. No murmurs, rubs, or gallops. No edema. Pedal pulses 2+ bilaterally.  GASTROINTESTINAL: Soft, nontender, nondistended. No masses. Positive bowel sounds. No hepatosplenomegaly.  MUSCULOSKELETAL: No swelling, clubbing, or edema. Range of motion full in all extremities.  NEUROLOGIC: Cranial nerves II through XII are intact. No gross focal neurological deficits. Sensation intact. Reflexes intact.  SKIN: No ulceration, lesions, rashes, or cyanosis. Skin warm and dry. Turgor intact.  PSYCHIATRIC: Mood, affect within normal limits. The patient is awake, alert and oriented x 3. Insight, judgment intact.       IMAGING    DG Chest 2 View  Result Date: 11/30/2019 CLINICAL DATA:  Shortness of breath and cough. EXAM:  CHEST - 2 VIEW COMPARISON:  February 12, 2018 FINDINGS: The mediastinal contour and cardiac silhouette are normal. Patchy opacities are identified throughout bilateral lungs. 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 No acute abnormalities identified in visualized bony structures. IMPRESSION: 1. Patchy opacities are identified throughout bilateral lungs suspicious for pneumonia. 2. 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 Electronically Signed   By: Sherian Rein M.D.   On: 11/30/2019 08:43   CT Angio Chest PE W and/or Wo Contrast  Result Date: 12/14/2019 CLINICAL DATA:  Shortness of breath EXAM: CT ANGIOGRAPHY CHEST WITH CONTRAST TECHNIQUE: Multidetector CT imaging of the chest was performed using the standard protocol during bolus administration of intravenous contrast. Multiplanar CT image reconstructions and MIPs were obtained to evaluate the vascular anatomy. CONTRAST:  OMNIPAQUE IOHEXOL 350 MG/ML SOLN COMPARISON:  Apr 22, 2010 FINDINGS: Cardiovascular: There is a optimal opacification of the pulmonary arteries. There is no central,segmental, or subsegmental filling defects within the pulmonary arteries. There is mild cardiomegaly. No evidence of right ventricular heart strain. There is normal three-vessel brachiocephalic anatomy without proximal stenosis. The thoracic aorta is normal in appearance. Mediastinum/Nodes: Again noted are extensively calcified mediastinal and bilateral hilar lymph nodes. Thyroid gland, trachea, and esophagus demonstrate no significant findings. Lungs/Pleura: There is extensive bilateral bronchial wall thickening and bronchiectasis with honeycombing most notable in the posterior lower lungs with subpleural bleb formation. There has been interval progression since the prior exam dating back to 20/11. There is new hazy/ground-glass opacity seen within the periphery of the bilateral lungs, left slightly greater than right. No  pleural effusion is seen. Upper Abdomen: No acute abnormalities present in the visualized portions of the upper abdomen. Musculoskeletal: No chest wall abnormality. No acute or significant osseous findings. Review of the MIP images confirms the above findings. IMPRESSION: 1. No central, segmental, or subsegmental pulmonary embolism. 2. Extensive bilateral hilar and mediastinal calcified adenopathy with extensive posterior lower lung bronchiectasis, honeycombing, and subpleural bleb formation. There has been interval progression in the lung changes since the prior exam, consistent with progression of the patient's known sarcoidosis. 3. Hazy/ground-glass opacities seen within both lungs, left greater than right, consistent with COVID pneumonia. 4. Mild cardiomegaly Electronically Signed   By: Jonna Clark M.D.   On: 12/14/2019 01:18   DG Chest Port 1 View  Result Date: 12/28/2019 CLINICAL DATA:  Acute on chronic respiratory failure with hypoxia secondary to COVID. COVID positive on 11/30/2019. EXAM: PORTABLE CHEST 1 VIEW COMPARISON:  Chest x-ray dated 01/01/2020. FINDINGS: Persistent opacities within the mid/upper lungs bilaterally, compatible with previously described COVID pneumonia, superimposed on extensive chronic interstitial lung disease/fibrosis. No appreciable change compared to the most recent chest x-ray of 12/28/2019. No pleural effusion or pneumothorax is seen. Heart size and mediastinal contours are stable. No acute or suspicious osseous finding. IMPRESSION: Stable chest x-ray. Persistent opacities within the mid/upper lungs bilaterally, compatible with previously described COVID pneumonia superimposed on extensive chronic interstitial lung disease/fibrosis. Electronically Signed   By: Bary Richard M.D.   On: 12/28/2019 12:54   DG Chest Port 1 View  Result Date: 12/21/2019 CLINICAL DATA:  64 year old female with shortness of breath. EXAM: PORTABLE CHEST 1 VIEW COMPARISON:  Chest radiograph dated  11/30/2019. FINDINGS: Background of fibrotic changes primarily involving the left mid to lower lung field. Slight increased density primarily over the left lung may be chronic but appears more prominent compared to the prior radiograph and developing infiltrate is not excluded. Clinical correlation is recommended. No pleural effusion or pneumothorax. The cardiac silhouette is within normal limits. Calcified hilar and mediastinal granuloma. No acute osseous pathology. IMPRESSION: 1. Slight increased density primarily in the left mid to lower lung field. Developing infiltrate is not excluded. Clinical correlation is recommended. 2. Calcified hilar and mediastinal granuloma. Electronically Signed   By: Elgie Collard M.D.   On: 12/07/2019 23:28    Below images are independently reviewed by me.           - - CT CHEST - interval progression of sarcoidosis stage 4 with worsening pulmonary fibrosis bilaterally over 10 years     -Chest x-ray-compared to December 26 current CXR with worsening airspace opacification noted at left upper lobe and lower peripherally as well as right middle lobe.  ASSESSMENT/PLAN   Acute on chronic hypoxemic respiratory failure  - patient has had improvement with recurrent decline on short interval follow up with re-admission 2 times in last 30 days - some concern for opportunistic etiology due to relative immunocompromised state from chronic steroids and recent multiple bursts of high dose IV and PO steroids - She has no peripheral edema and very mild bibasilar crackles pointing to less likely interstitial edema as cause - Specifically she may develop PJP and will need at minimum bactrim for PPX -  beta-D-glucan serology and fungal ab workup -in process - discussed bronchoscopy for airway inspection and BAL to evaluate for yeastform/hyphae, microbiology cultures and staining - patient is agreeable - there is decreased air entry - may need recruitment maneuvers - continue  MetaNEB   Pulmonary Sarcoidosis Stage IV  - despite chronic gluccocorticoids patients fibrosis seems to be progressing as evidenced by serial imaging above - we have discussed adding steroid sparing immunomodulator and patient is agreeable - will start Imuran today  - patient is advised to review changes with primary pulmonologist - Dr Forrest Moron to see if he is in agreement and any further recommendations   Asthma and COPD overlap syndrome (ACOS)   - patient currently on solumedrol - 40 q 12h - will plan to wean as able  - agree with DuoNEb  - combivent MDI , breo-ellipta   -encourage use of IS and participate in PT/OT          Thank you for allowing me to participate in the care of this patient.   Patient/Family are satisfied with care plan and all questions have been answered.  This document was prepared using Conservation officer, historic buildings and may include  unintentional dictation errors.     Ottie Glazier, M.D.  Division of [facility]

## 2019-12-30 LAB — GLUCOSE, CAPILLARY
Glucose-Capillary: 184 mg/dL — ABNORMAL HIGH (ref 70–99)
Glucose-Capillary: 165 mg/dL — ABNORMAL HIGH (ref 70–99)
Glucose-Capillary: 279 mg/dL — ABNORMAL HIGH (ref 70–99)
Glucose-Capillary: 212 mg/dL — ABNORMAL HIGH (ref 70–99)

## 2019-12-30 NOTE — Progress Notes (Signed)
PROGRESS NOTE    Marissa Luna  KCM:[id number] DOB: 24-Mar-1956 DOA: 18-Dec-2019  PCP: Carlean Jews, NP    LOS - 16   Brief Narrative:  64 y.o.femalewith medical historyofsarcoidosis and COPD with chronic respiratory failure on2L/min homeO2, hypertension and recently hospitalizedfrom 11/30/19 - 12/30/20withCOVID-19pneumonia andacute on chronicrespiratory failure treated with 5 days of remdesivir and Decadron. She presented to the ED on 1/8with a complaintsof increased cough, weakness and shortness of breath.In the ED, afebrile, mildly tachycardic and tachypneic, hypoxic in 80's on room air. Labs notable for leukocytosis 17k, elevated inflammatory markers including D-dimer of 1572, ferritin 645, CRP 41.6. CT chest showed hazy groundglass opacities seen within both lungs consistent with COVID-19 pneumonia,and interval progression of sarcoidosis compared to 2011. She was started on IV antibiotics, dexamethasone andadmitted tohospitalistservice.  SUBJECTIVE: Felling alittle better, but sob not at baseline. Became sob with ambulation ealier. No other complaints.   Assessment & Plan:   Principal Problem:   Acute on chronic respiratory failure with hypoxia (HCC) Active Problems:   Sarcoidosis of lung (HCC)   Essential hypertension   Weakness   Healthcare-associated pneumonia   History of COVID-19   Acute respiratory failure (HCC)   Multifocal pneumonia   Type 2 diabetes mellitus without complication, without long-term current use of insulin (HCC)   Acute on chronic respiratory failure with hypoxia in setting of recent Covid-19 pneumonia, admitted 12/26 to 12/30. Completed treatment with 5 days remdesivir and 10 days Decadron. Hadbeenusing 2 L/min nocturnaloxygen atbaseline. Increased oxygen requirements 1/13-1/14, up to 40 L/min HFNC, from8 L/minpreviously. On 1/20 able to be weaned down to 4 L/min (after few days higher dose steroids).   Since  1/20 patient's oxygen requirement is slowly been increasing Low suspicion for acute infection, suspect that background of sarcoid is primary driving factor Per pulmonology there is some concern opportunistic etiology due to chronic steroid, started on Bactrim prophylaxis Started on Imuran Continue IV steroids beta-D-glucan serology and fungal ab workup ordered Bronchoscopy was discussed with the patient for airway inspection and BAL to evaluate for yeastform/hyphae, microbiology cultures and staining -patient has agreed Pulmonology added MetaNEB Per previous hospitalist note Dr. Lennette Bihari, patient's pulmonologist indicated that respiratory recovery may be a prolonged process and the patient may benefit from placement in a long-term acute care facility PT/OT-rec. SNF   Bilateral lobar pneumonia In setting of recent COVID-19 pneumonia.  Completed course of Zithromaxand Rocephin.   Asthma and COPD overlap syndrome Continue on Solu-Medrol 40 mg twice daily DuoNeb Combivent MDI and Breo Ellipta Incentive spirometer   Sarcoidosis Noted to have progressed since 2011 on CT scan obtained on admission. Appears quite extensive and severe on personal review of CT scan. --Continue steroids as above --Follow with pulmonology see above  Type 2 diabetes Hyperglycemia - secondary to steroids A1c 8.0%. Not on medications for diabetes as outpatient. --continueLantus 12units at bedtime --NovoLog19units with TID with meals  - ck fs  , riss --Should be discharged on an oral medication and follow-up with PCP closely --Carb modified diet --Diabetes coordinator consulted as patient likely to need insulin on discharge given long steroid taper?  Back Pain  likely from immobility this admission --heating pad --Norco PRN --Robaxin PRN  Essential hypertension chronic, stable -Continue Norvasc  Restless leg syndrome chronic, stable -Continue Requip  Generalized weakness secondary  to recent acute illnesses and underlying comorbidities. PT/OT rec. snf   DVT prophylaxis:Lovenox Code Status: Full Code Family Communication:none at bedside Disposition Plan:SNF pending, medically not ready as she is  on IV steroids and quite breathless still, will need to continue current medical management until we are able to wean her oxygen down    Consultants:  Pulmonology  Procedures:  None  Antimicrobials:  Rocephincompleted  Zithromax completed   Objective: Vitals:   12/29/19 0530 12/29/19 1149 12/29/19 2033 12/30/19 0433  BP: 130/76 (!) 132/59 129/68 129/69  Pulse: 96 (!) 118 97 100  Resp: 20 20 (!) 22 20  Temp: 97.9 F (36.6 C) 98.1 F (36.7 C) 98.2 F (36.8 C) 98.5 F (36.9 C)  TempSrc: Oral Axillary Oral Oral  SpO2: 98% 97% 95% 99%  Weight:      Height:        Intake/Output Summary (Last 24 hours) at 12/30/2019 0817 Last data filed at 12/30/2019 0522 Gross per 24 hour  Intake 120 ml  Output 1100 ml  Net -980 ml   Filed Weights   08-Jan-2020 2252 12/24/19 1552  Weight: 71.2 kg 70.6 kg    Examination:  General exam: less breathless today when talking,nad Respiratory system: minimal scattered b/l crackles at bases  No wheezing  cardiovascular system:RRR. No murmurs, rubs, gallops. Gastrointestinal system: Abdomen is nondistended, soft and nontender.  Normal bowel sounds heard. Central nervous system: Alert and oriented. No focal neurological deficits. Extremities: No edema Skin: Warm dry Psychiatry: Judgement and insight appear normal. Mood & affect appropriate.    Data Reviewed: I have personally reviewed following labs and imaging studies  CBC: Recent Labs  Lab 12/26/19 0456 12/28/19 0509  WBC 14.6* 12.1*  NEUTROABS 12.6*  --   HGB 12.1 11.0*  HCT 38.9 35.1*  MCV 91.5 90.9  PLT 178 199   Basic Metabolic Panel: Recent Labs  Lab 12/26/19 0456 12/27/19 0523 12/28/19 0509  NA 145 145 146*  K 4.5 5.0 4.3  CL 103  105 105  CO2 31 30 31   GLUCOSE 97 185* 96  BUN 31* 39* 33*  CREATININE 0.65 0.94 0.73  0.67  CALCIUM 8.5* 8.3* 8.5*   GFR: Estimated Creatinine Clearance: 60.8 mL/min (by C-G formula based on SCr of 0.67 mg/dL). Liver Function Tests: No results for input(s): AST, ALT, ALKPHOS, BILITOT, PROT, ALBUMIN in the last 168 hours. No results for input(s): LIPASE, AMYLASE in the last 168 hours. No results for input(s): AMMONIA in the last 168 hours. Coagulation Profile: No results for input(s): INR, PROTIME in the last 168 hours. Cardiac Enzymes: No results for input(s): CKTOTAL, CKMB, CKMBINDEX, TROPONINI in the last 168 hours. BNP (last 3 results) No results for input(s): PROBNP in the last 8760 hours. HbA1C: No results for input(s): HGBA1C in the last 72 hours. CBG: Recent Labs  Lab 12/29/19 0736 12/29/19 1147 12/29/19 1622 12/29/19 2123 12/30/19 0754  GLUCAP 354* 312* 145* 73 184*   Lipid Profile: No results for input(s): CHOL, HDL, LDLCALC, TRIG, CHOLHDL, LDLDIRECT in the last 72 hours. Thyroid Function Tests: No results for input(s): TSH, T4TOTAL, FREET4, T3FREE, THYROIDAB in the last 72 hours. Anemia Panel: No results for input(s): VITAMINB12, FOLATE, FERRITIN, TIBC, IRON, RETICCTPCT in the last 72 hours. Sepsis Labs: No results for input(s): PROCALCITON, LATICACIDVEN in the last 168 hours.  No results found for this or any previous visit (from the past 240 hour(s)).       Radiology Studies: DG Chest Port 1 View  Result Date: 12/28/2019 CLINICAL DATA:  Acute on chronic respiratory failure with hypoxia secondary to COVID. COVID positive on 11/30/2019. EXAM: PORTABLE CHEST 1 VIEW COMPARISON:  Chest x-ray dated January 08, 2020.  FINDINGS: Persistent opacities within the mid/upper lungs bilaterally, compatible with previously described COVID pneumonia, superimposed on extensive chronic interstitial lung disease/fibrosis. No appreciable change compared to the most recent chest  x-ray of 12/28/2019. No pleural effusion or pneumothorax is seen. Heart size and mediastinal contours are stable. No acute or suspicious osseous finding. IMPRESSION: Stable chest x-ray. Persistent opacities within the mid/upper lungs bilaterally, compatible with previously described COVID pneumonia superimposed on extensive chronic interstitial lung disease/fibrosis. Electronically Signed   By: Franki Cabot M.D.   On: 12/28/2019 12:54        Scheduled Meds: . amLODipine  10 mg Oral Daily  . ascorbic acid  500 mg Oral Daily  . azaTHIOprine  100 mg Oral Daily  . calcium-vitamin D  1 tablet Oral Daily  . enoxaparin (LOVENOX) injection  40 mg Subcutaneous Q24H  . famotidine  20 mg Oral Daily  . feeding supplement (ENSURE ENLIVE)  237 mL Oral BID BM  . fluticasone  2 spray Each Nare Daily  . fluticasone furoate-vilanterol  1 puff Inhalation Daily  . folic acid  1 mg Oral Daily  . furosemide  20 mg Oral Daily  . insulin aspart  0-5 Units Subcutaneous QHS  . insulin aspart  0-9 Units Subcutaneous TID WC  . insulin aspart  19 Units Subcutaneous TID WC  . insulin glargine  12 Units Subcutaneous QHS  . Ipratropium-Albuterol  1 puff Inhalation Q6H  . loratadine  10 mg Oral Daily  . methylPREDNISolone (SOLU-MEDROL) injection  40 mg Intravenous Q12H  . multivitamin with minerals  1 tablet Oral Daily  . rOPINIRole  0.25 mg Oral QHS  . sulfamethoxazole-trimethoprim  1 tablet Oral Q12H   Continuous Infusions: . sodium chloride 20 mL (12/18/19 1006)     LOS: 16 days    Time spent: 45 minutes with more than 50% COC    Nolberto Hanlon, MD [facility] If 7PM-7AM, please contact night-coverage [email or web address] 12/30/2019, 8:17 AM  Patient ID: Sharol Roussel, female   DOB: [birth year], 64 y.o.   MRN: [id number]

## 2019-12-30 NOTE — Progress Notes (Addendum)
Physical Therapy Treatment Patient Details Name: Marissa Luna MRN: [id number] DOB: 1956-05-17 Today's Date: 12/30/2019    History of Present Illness Pt admitted for acute on chronic respiratory failure with hypoxia secondary to Covid. She complains of cough, weakness, and SOB symptoms. History includes sarcodosis, COPD, CRD on 2L of O2 chronic, HTN and recent hospital stay for covid 12/26- 12/30, now off airborne isolation..    PT Comments    Pt in room upon entry agreeable to participate. Pt on 6LHFNC at entry, humidifier empty, RN made aware who avails this problem. Pt moved to 8L HFNC for comfort, improved recovery time, and in attempts to better control HR, but pt still desats to 87% with 1-2 minute recovery. HR remains 116-120 bpm with remaining activity in session. Pt maintained on 10L/min HFNC for transfers and standing at bedside. Pt limited by Left hip/groin pain just as much as fatigue and SOB, but puts forth good effort. Pt requires extended recovery time 3-4 minutes between efforts to gather her wits, recovery SPO2, recover HR. Pt agreeable to stay up in chair at end of session, transfers to recliner with RW at supervision level. Pt returned to 6L/min HFNC at end of session.     Follow Up Recommendations  SNF;Supervision for mobility/OOB;Supervision - Intermittent     Equipment Recommendations  None recommended by PT    Recommendations for Other Services       Precautions / Restrictions Precautions Precautions: Fall Precaution Comments: Watch HR&O2, now on 6L nasal cannula this date. SpO2 decreased from 92%-93% to 88% with STS and lat scoot transfers and standing but once seated rest break recovers to >90% with >44min and use of PLB. HR increase from 106 bpm to 122 bpm from sitting to standing. Restrictions Weight Bearing Restrictions: No    Mobility  Bed Mobility Overal bed mobility: Needs Assistance Bed Mobility: Supine to Sit;Sit to Supine     Supine to sit:  Supervision Sit to supine: Supervision   General bed mobility comments: pacing self d/t fatigue adn hip pain  Transfers Overall transfer level: Needs assistance Equipment used: Rolling walker (2 wheeled) Transfers: Sit to/from Stand Sit to Stand: Min guard;Min assist;From elevated surface         General transfer comment: 3x from EOB x60sec standing on 8L, then 10L for 2*, 3*  Ambulation/Gait Ambulation/Gait assistance: (deferred)               Stairs             Wheelchair Mobility    Modified Rankin (Stroke Patients Only)       Balance Overall balance assessment: Mild deficits observed, not formally tested;Modified Independent Sitting-balance support: Feet supported;Feet unsupported;No upper extremity supported Sitting balance-Leahy Scale: Good     Standing balance support: Bilateral upper extremity supported;During functional activity Standing balance-Leahy Scale: Poor Standing balance comment: avoids LLE loading d/t pain                            Cognition Arousal/Alertness: Awake/alert Behavior During Therapy: WFL for tasks assessed/performed Overall Cognitive Status: Within Functional Limits for tasks assessed                                        Exercises General Exercises - Lower Extremity Heel Slides: AROM;Strengthening;15 reps;Left;Supine Hip ABduction/ADduction: AROM;Strengthening;15 reps;Left;Supine Other Exercises Other Exercises: STS from  EOB + 60sec standing: 3x c RW, supervision level Other Exercises: Pt instructed in pursed lip breathing to support breath recovery during exertional mobility/ADL Tasks    General Comments        Pertinent Vitals/Pain Pain Assessment: 0-10 Pain Score: 8  Faces Pain Scale: Hurts even more Pain Location: L hipGT to groin anterior Pain Descriptors / Indicators: Constant;Aching Pain Intervention(s): Limited activity within patient's tolerance;Monitored during  session;Repositioned    Home Living                      Prior Function            PT Goals (current goals can now be found in the care plan section) Acute Rehab PT Goals Patient Stated Goal: to get stronger and breathe better PT Goal Formulation: With patient Time For Goal Achievement: 01/11/20 Potential to Achieve Goals: Good    Frequency    Min 2X/week      PT Plan      Co-evaluation              AM-PAC PT "6 Clicks" Mobility   Outcome Measure  Help needed turning from your back to your side while in a flat bed without using bedrails?: A Little Help needed moving from lying on your back to sitting on the side of a flat bed without using bedrails?: A Little Help needed moving to and from a bed to a chair (including a wheelchair)?: A Little Help needed standing up from a chair using your arms (e.g., wheelchair or bedside chair)?: A Little Help needed to walk in hospital room?: A Lot Help needed climbing 3-5 steps with a railing? : Total 6 Click Score: 15    End of Session Equipment Utilized During Treatment: Oxygen Activity Tolerance: Patient tolerated treatment well;Patient limited by fatigue;Patient limited by pain;Treatment limited secondary to medical complications (Comment) Patient left: in chair;with chair alarm set;with call bell/phone within reach Nurse Communication: Mobility status PT Visit Diagnosis: Muscle weakness (generalized) (M62.81);Difficulty in walking, not elsewhere classified (R26.2)     Time: 7035-0093 PT Time Calculation (min) (ACUTE ONLY): 37 min  Charges:  $Therapeutic Exercise: 23-37 mins                     2:37 PM, 12/30/19 Rosamaria Lints, PT, DPT Physical Therapist - [facility]  [phone] (ASCOM)    [name] 12/30/2019, 2:33 PM

## 2019-12-30 NOTE — Progress Notes (Signed)
Occupational Therapy Treatment Patient Details Name: LENDA BARATTA MRN: [id number] DOB: 04/21/1956 Today's Date: 12/30/2019    History of present illness Pt admitted for acute on chronic respiratory failure with hypoxia secondary to Covid. She complains of cough, weakness, and SOB symptoms. History includes sarcodosis, COPD, CRD on 2L of O2 chronic, HTN and recent hospital stay for covid 12/26- 12/30, now off airborne isolation..   OT comments  Pt seen for OT tx this date. Pt reporting mild SOB and L hip pain, but agreeable to therapy. On 6L O2 throughout session. At rest, O2 sats 93%, HR 106 supine. Pt performed supine to sit EOB with supervision. Once EOB, O2 sats dropped to 90%, HR 112. Pt instructed in pursed lip breathing to support breath recovery. With standing approx 2 minutes, HR increased to 116, O2 sats down to 88% on 6L. With >5 min seated rest break and reinforcement of using  learned pursed lip breathing, O2 sats back up to 92% and HR 114. Pt remained seated to perform grooming tasks EOB and assist required for hygiene after using PureWick. HR up to 122 after lateral scoots prior to return to bed with improved HR. RN aware. Pt continues to benefit from skilled OT services. Recommendation updated to reflect pt's current status. Given significant recovery time required following exertion and need for 6L O2 at this time, recommend STR. Will continue to progress.    Follow Up Recommendations  SNF    Equipment Recommendations  Tub/shower seat;Other (comment)(reacher, handheld shower head)    Recommendations for Other Services      Precautions / Restrictions Precautions Precautions: Fall Precaution Comments: Watch HR&O2, now on 6L nasal cannula this date. SpO2 decreased from 92%-93% to 88% with STS and lat scoot transfers and standing but once seated rest break recovers to >90% with >61min and use of PLB. HR increase from 106 bpm to 122 bpm from sitting to standing. Restrictions Weight  Bearing Restrictions: No       Mobility Bed Mobility Overal bed mobility: Needs Assistance Bed Mobility: Supine to Sit;Sit to Supine     Supine to sit: Supervision;HOB elevated Sit to supine: Min assist   General bed mobility comments: Min A for LLE mgt back to bed 2/2 L hip pain  Transfers Overall transfer level: Needs assistance Equipment used: Rolling walker (2 wheeled) Transfers: Sit to/from Stand Sit to Stand: Min guard;Min assist;From elevated surface              Balance Overall balance assessment: Needs assistance Sitting-balance support: Feet supported;Single extremity supported Sitting balance-Leahy Scale: Good     Standing balance support: Bilateral upper extremity supported Standing balance-Leahy Scale: Fair Standing balance comment: BUE support 2/2 poor activity tolerance/fatigue                           ADL either performed or assessed with clinical judgement   ADL Overall ADL's : Needs assistance/impaired     Grooming: Set up;Sitting                       Toileting- Clothing Manipulation and Hygiene: Maximal assistance;Sit to/from stand Toileting - Clothing Manipulation Details (indicate cue type and reason): pt demonstrating impaired activity tolerance, requiring BUE support on RW with forearms resting on RW, with therapist providing max A for hygiene             Vision Baseline Vision/History: No visual deficits Patient Visual Report: No  change from baseline     Perception     Praxis      Cognition Arousal/Alertness: Awake/alert Behavior During Therapy: WFL for tasks assessed/performed Overall Cognitive Status: Within Functional Limits for tasks assessed                                          Exercises Other Exercises Other Exercises: Pt instructed in pursed lip breathing to support breath recovery during exertional mobility/ADL Tasks   Shoulder Instructions       General Comments       Pertinent Vitals/ Pain       Pain Assessment: Faces Faces Pain Scale: Hurts even more Pain Location: L hip Pain Descriptors / Indicators: Constant;Aching Pain Intervention(s): Heat applied;Limited activity within patient's tolerance;Monitored during session;Repositioned  Home Living                                          Prior Functioning/Environment              Frequency  Min 1X/week        Progress Toward Goals  OT Goals(current goals can now be found in the care plan section)  Progress towards OT goals: Progressing toward goals  Acute Rehab OT Goals Patient Stated Goal: to get stronger and breathe better OT Goal Formulation: With patient Time For Goal Achievement: 01/09/20 Potential to Achieve Goals: Good  Plan Discharge plan needs to be updated;Frequency remains appropriate    Co-evaluation                 AM-PAC OT "6 Clicks" Daily Activity     Outcome Measure   Help from another person eating meals?: None Help from another person taking care of personal grooming?: None Help from another person toileting, which includes using toliet, bedpan, or urinal?: A Little Help from another person bathing (including washing, rinsing, drying)?: A Little Help from another person to put on and taking off regular upper body clothing?: None Help from another person to put on and taking off regular lower body clothing?: A Little 6 Click Score: 21    End of Session Equipment Utilized During Treatment: Gait belt;Rolling walker;Oxygen(6L)  OT Visit Diagnosis: Other abnormalities of gait and mobility (R26.89);Muscle weakness (generalized) (M62.81)   Activity Tolerance Patient tolerated treatment well   Patient Left in bed;with call bell/phone within reach;with bed alarm set   Nurse Communication          Time: 0881-1031 OT Time Calculation (min): 52 min  Charges: OT General Charges $OT Visit: 1 Visit OT Treatments $Self Care/Home  Management : 38-52 mins  Richrd Prime, MPH, MS, OTR/L ascom [phone] 12/30/19, 11:23 AM

## 2019-12-30 NOTE — Progress Notes (Signed)
Nutrition Follow Up Note   DOCUMENTATION CODES:   Obesity unspecified  INTERVENTION:   - Ensure Enlive po BID, each supplement provides 350 kcal and 20 grams of protein  - MVI with minerals daily  NUTRITION DIAGNOSIS:   Increased nutrient needs related to acute illness, catabolic illness (COVID-19) as evidenced by estimated needs.  GOAL:   Patient will meet greater than or equal to 90% of their needs -progressing   MONITOR:   PO intake, Supplement acceptance, Labs, Weight trends  ASSESSMENT:   63 year old female who presented to the ED on 1/08 with increased cough, weakness, SOB. Pt diagnosed with COVID-19 on 11/30/19 and was hospitalized from 12/26-12/30/20 with pneumonia and respiratory failure secondary to COVID-19. PMH of COPD with chronic respiratory failure on home oxygen, HTN, sarcoidosis.   Pt continues to have good appetite and oral intake; pt eating 100% of her meals and drinking some of her Ensure. No new weight since 1/19; pt appears weight stable since admit. Pt noted to have type 1 BM today; recommend bowel regimen per MD.   Medications reviewed and include: vitamin C, azithromycin, Oscal with D, lovenox, folic acid, Lasix, insulin, solu-medrol, MVI, bactrim  Labs reviewed: cbgs- 145, 73, 184 x 24 hrs  Diet Order:   Diet Order            Diet - low sodium heart healthy        Diet Carb Modified Fluid consistency: Thin; Room service appropriate? Yes  Diet effective now             EDUCATION NEEDS:   Education needs have been addressed  Skin:  Skin Assessment: Reviewed RN Assessment  Last BM:  1/24- type 1  Height:   Ht Readings from Last 1 Encounters:  12/26/2019 4\' 11"  (1.499 m)    Weight:   Wt Readings from Last 1 Encounters:  12/24/19 70.6 kg    Ideal Body Weight:  44.7 kg  BMI:  Body mass index is 31.45 kg/m.  Estimated Nutritional Needs:   Kcal:  1700-1900  Protein:  85-100 grams  Fluid:  >/= 1.7 L  12/26/19 MS, RD,  LDN Pager #- [phone] Office#- [phone] After Hours Pager: [phone]

## 2019-12-31 LAB — GLUCOSE, CAPILLARY
Glucose-Capillary: 215 mg/dL — ABNORMAL HIGH (ref 70–99)
Glucose-Capillary: 347 mg/dL — ABNORMAL HIGH (ref 70–99)
Glucose-Capillary: 68 mg/dL — ABNORMAL LOW (ref 70–99)
Glucose-Capillary: 125 mg/dL — ABNORMAL HIGH (ref 70–99)
Glucose-Capillary: 352 mg/dL — ABNORMAL HIGH (ref 70–99)

## 2019-12-31 NOTE — TOC Progression Note (Signed)
Transition of Care [facility]) - Progression Note    Patient Details  Name: Marissa Luna MRN: [id number] Date of Birth: 01/19/1956  Transition of [facility]) CM/SW Contact  Chapman Fitch, RN Phone Number: 12/31/2019, 3:13 PM  Clinical Narrative:     Message left for Lynnea Ferrier at [facility] to determine if there is an update on insurance auth for SNF  Expected Discharge Plan: Home w Home Health Services    Expected Discharge Plan and Services Expected Discharge Plan: Home w Home Health Services   Discharge Planning Services: CM Consult Post Acute Care Choice: Home Health, Durable Medical Equipment Living arrangements for the past 2 months: Single Family Home Expected Discharge Date: 12/19/19               DME Arranged: Dan Humphreys rolling DME Agency: [employer]       HH Arranged: RN, PT HH Agency: [facility] Date HH Agency Contacted: 12/17/19   Representative spoke with at [facility] Agency: Barbara Cower   Social Determinants of Health (SDOH) Interventions    Readmission Risk Interventions No flowsheet data found.

## 2019-12-31 NOTE — Progress Notes (Signed)
Physical Therapy Treatment Patient Details Name: Marissa Luna MRN: [id number] DOB: September 13, 1956 Today's Date: 12/31/2019    History of Present Illness Pt admitted for acute on chronic respiratory failure with hypoxia secondary to Covid. She complains of cough, weakness, and SOB symptoms. History includes sarcodosis, COPD, CRD on 2L of O2 chronic, HTN and recent hospital stay for covid 12/26- 12/30, now off airborne isolation..    PT Comments    Pt extremely quick to fatigue with even very minimal activity while on 7 LO2, sats generally stayed in the low 90s with at rest and would drop to the 80s with minimal activity.  Pt complaining of excessive pain in L hip with all involved tasks.  She was eager to do as well as she could, but simply tired to quickly to be able to really show a lot of functional effort. Answered some questions pt had re: STR, pt is resigned to the idea that she will have to go to rehab before she is safe to return home.  Follow Up Recommendations  SNF;Supervision for mobility/OOB     Equipment Recommendations  None recommended by PT    Recommendations for Other Services       Precautions / Restrictions Precautions Precautions: Fall Restrictions Weight Bearing Restrictions: No    Mobility  Bed Mobility Overal bed mobility: Needs Assistance             General bed mobility comments: in recliner on arrival  Transfers Overall transfer level: Needs assistance Equipment used: Rolling walker (2 wheeled) Transfers: Sit to/from Stand Sit to Stand: Min assist         General transfer comment: Pt struggling with mustering strength to get to standing and was able to intiate getting to standing but ultimately needed light assist to fully attain upright  Ambulation/Gait             General Gait Details: Pt was eager to try a small bout of ambulation, but became to tired, short of breath and ultimately was unable to even manage a single step before needing  to sit   Stairs             Wheelchair Mobility    Modified Rankin (Stroke Patients Only)       Balance Overall balance assessment: Needs assistance Sitting-balance support: Feet supported;No upper extremity supported Sitting balance-Leahy Scale: Good Sitting balance - Comments: Steady static and dynamic sitting on BSC. Able to perform Peri-care w/o LOB noted.   Standing balance support: Bilateral upper extremity supported Standing balance-Leahy Scale: Fair Standing balance comment: poor standing tolerance, unable to fully assess                            Cognition Arousal/Alertness: Awake/alert Behavior During Therapy: WFL for tasks assessed/performed Overall Cognitive Status: Within Functional Limits for tasks assessed                                 General Comments: Pt motivated, cooperative throughout session      Exercises General Exercises - Lower Extremity Ankle Circles/Pumps: AROM;10 reps;Both Long Arc Quad: Strengthening;10 reps;Both Heel Slides: Strengthening;10 reps;Both Hip ABduction/ADduction: Strengthening;10 reps;Both Hip Flexion/Marching: Strengthening;10 reps;Both Other Exercises Other Exercises: Pt provided with reinforcement ofin falls prevention and energy conservation strategies to support safe participation in ADL and IADL Tasks while minimizing risk of over exertion/SOB. Other Exercises: OT assists pt  with toilet transfer off BSC, with consistent cueing for use of ECS. See ADL section for additional detail.    General Comments        Pertinent Vitals/Pain Pain Assessment: 0-10 Pain Score: 7  Pain Location: L hip to anterior groin    Home Living                      Prior Function            PT Goals (current goals can now be found in the care plan section) Acute Rehab PT Goals Patient Stated Goal: to get stronger and breathe better Progress towards PT goals: Progressing toward goals     Frequency    Min 2X/week      PT Plan Current plan remains appropriate    Co-evaluation              AM-PAC PT "6 Clicks" Mobility   Outcome Measure  Help needed turning from your back to your side while in a flat bed without using bedrails?: A Little Help needed moving from lying on your back to sitting on the side of a flat bed without using bedrails?: A Little Help needed moving to and from a bed to a chair (including a wheelchair)?: A Little Help needed standing up from a chair using your arms (e.g., wheelchair or bedside chair)?: A Little Help needed to walk in hospital room?: A Lot Help needed climbing 3-5 steps with a railing? : Total 6 Click Score: 15    End of Session Equipment Utilized During Treatment: Oxygen(7L t/o session) Activity Tolerance: Patient limited by fatigue Patient left: with chair alarm set;with call bell/phone within reach Nurse Communication: Mobility status(O2 back to 6L) PT Visit Diagnosis: Muscle weakness (generalized) (M62.81);Difficulty in walking, not elsewhere classified (R26.2)     Time: 7681-1572 PT Time Calculation (min) (ACUTE ONLY): 29 min  Charges:  $Therapeutic Exercise: 8-22 mins $Therapeutic Activity: 8-22 mins                     Malachi Pro, DPT 12/31/2019, 5:40 PM

## 2019-12-31 NOTE — Progress Notes (Signed)
Complains of left hip pain, constant, aching. Started within last week.

## 2019-12-31 NOTE — Progress Notes (Signed)
Inpatient Diabetes Program Recommendations  AACE/ADA: New Consensus Statement on Inpatient Glycemic Control (2015)  Target Ranges:  Prepandial:   less than 140 mg/dL      Peak postprandial:   less than 180 mg/dL (1-2 hours)      Critically ill patients:  140 - 180 mg/dL   Lab Results  Component Value Date   GLUCAP 347 (H) 12/31/2019   HGBA1C 8.0 (H) 12/14/2019    Review of Glycemic Control  Results for TZIPORAH, KNOKE (MRN [id number]) as of 12/31/2019 12:11  Ref. Range 12/30/2019 11:47 12/30/2019 16:12 12/30/2019 21:16 12/31/2019 07:57 12/31/2019 11:27  Glucose-Capillary Latest Ref Range: 70 - 99 mg/dL 301 (H) 599 (H) 689 (H) 215 (H) 347 (H)    Diabetes history: None Outpatient Diabetes medications: None Current orders for Inpatient glycemic control: Lantus 12 units QD + Novolog 0-9 TID + 0-5 QHS + Novolog 19 units TID with meals + Solumedrol 40 mg Q12H   Inpatient diabetes recommendations:  -Novolog 0-15 units TID  Thank you, Dulce Sellar, RN, BSN Diabetes Coordinator Inpatient Diabetes Program [phone] (team pager from 8a-5p)

## 2019-12-31 NOTE — Progress Notes (Signed)
Occupational Therapy Treatment Patient Details Name: Marissa Luna MRN: [id number] DOB: 1956/03/17 Today's Date: 12/31/2019    History of present illness Pt admitted for acute on chronic respiratory failure with hypoxia secondary to Covid. She complains of cough, weakness, and SOB symptoms. History includes sarcodosis, COPD, CRD on 2L of O2 chronic, HTN and recent hospital stay for covid 12/26- 12/30, now off airborne isolation..   OT comments  Ms. Urquiza was seen for OT treatment on this date. Upon arrival to room pt seated on BSC. Pt noted to be on 6 L HFNC with HR of 121 and spO2 of 90-91%. This author encourages pt to engage in PLB while seated on BSC, but ultimately pt requires increase to 7L HFNC to bring spO2 up to 92%. Pt educated on safe use of AE/DME, activity pacing, and pursed lip breathing during functional task to minimize adverse symptoms and maximize pt safety and independence with ADL management. Pt return demonstrates understanding of education provided, but requires moderate cueing for PLB technique. Pt c/o increased coughing this date, particularly on deep inhalation which limits PLB. Pt assisted with transfer from [facility] to room recliner with HR remaining in 120's during functional transfer and spO2 dropping to 84% once pt seated in chair on 7L. OT encourages pt to take rest break with PLB. Pt HR down to 110's after >5 min break and spO2 remains in low 90's (92-93%). Pt left in room recliner on 7L HFNC. RN notified.  P progressing toward goals and continues to benefit from skilled OT services to maximize return to PLOF and minimize risk of future falls, injury, caregiver burden, and readmission. Will continue to follow POC. Discharge recommendation remains appropriate.    Follow Up Recommendations  SNF    Equipment Recommendations  Other (comment);Tub/shower seat(LH reacher, handheld shower head.)    Recommendations for Other Services      Precautions / Restrictions  Precautions Precautions: Fall Restrictions Weight Bearing Restrictions: No       Mobility Bed Mobility Overal bed mobility: Needs Assistance             General bed mobility comments: deferred. Pt up on [facility] and t/f to room recliner at end of session.  Transfers Overall transfer level: Needs assistance Equipment used: Rolling walker (2 wheeled) Transfers: Sit to/from Stand Sit to Stand: Min guard;From elevated surface;Min assist         General transfer comment: Pt performs STS from [facility] and SPT to room recliner with min guard to min assist throughout. Pt requires spO2 increase to 7 L during functional mobility with HR and spO2 monitored t/o.    Balance Overall balance assessment: Needs assistance Sitting-balance support: Feet supported;No upper extremity supported Sitting balance-Leahy Scale: Good Sitting balance - Comments: Steady static and dynamic sitting on BSC. Able to perform Peri-care w/o LOB noted.   Standing balance support: Single extremity supported;During functional activity Standing balance-Leahy Scale: Fair Standing balance comment: Requires at least 1 UE support during functional mobility. Limited 2/2 weakness and SOB on exertion this date.                           ADL either performed or assessed with clinical judgement   ADL Overall ADL's : Needs assistance/impaired                         Toilet Transfer: Stand-pivot;BSC;Min Sports administrator Details (indicate cue type and reason):  Pt performs SPT from [facility] to room chair this date with min guard to min a for safety/balance. Pt performs with 1 person handheld assist. Toileting- Clothing Manipulation and Hygiene: Sitting/lateral lean;Set up;Supervision/safety Toileting - Clothing Manipulation Details (indicate cue type and reason): Pt able to perform peri-care while sitting/leaning on BSC this date. She does not require physical assist from therapist to  complete.     Functional mobility during ADLs: Minimal assistance;Rolling walker       Vision Baseline Vision/History: No visual deficits Patient Visual Report: No change from baseline     Perception     Praxis      Cognition Arousal/Alertness: Awake/alert Behavior During Therapy: WFL for tasks assessed/performed Overall Cognitive Status: Within Functional Limits for tasks assessed                                 General Comments: Pt motivated, cooperative throughout session        Exercises Other Exercises Other Exercises: Pt provided with reinforcement ofin falls prevention and energy conservation strategies to support safe participation in ADL and IADL Tasks while minimizing risk of over exertion/SOB. Other Exercises: OT assists pt with toilet transfer off BSC, with consistent cueing for use of ECS. See ADL section for additional detail.   Shoulder Instructions       General Comments      Pertinent Vitals/ Pain       Pain Assessment: No/denies pain  Home Living                                          Prior Functioning/Environment              Frequency  Min 1X/week        Progress Toward Goals  OT Goals(current goals can now be found in the care plan section)  Progress towards OT goals: Progressing toward goals  Acute Rehab OT Goals Patient Stated Goal: to get stronger and breathe better OT Goal Formulation: With patient Time For Goal Achievement: 01/09/20 Potential to Achieve Goals: Good  Plan Discharge plan needs to be updated;Frequency remains appropriate    Co-evaluation                 AM-PAC OT "6 Clicks" Daily Activity     Outcome Measure   Help from another person eating meals?: None Help from another person taking care of personal grooming?: A Little Help from another person toileting, which includes using toliet, bedpan, or urinal?: A Little Help from another person bathing (including  washing, rinsing, drying)?: A Little Help from another person to put on and taking off regular upper body clothing?: None Help from another person to put on and taking off regular lower body clothing?: A Little 6 Click Score: 20    End of Session Equipment Utilized During Treatment: Gait belt;Oxygen(7L)  OT Visit Diagnosis: Other abnormalities of gait and mobility (R26.89);Muscle weakness (generalized) (M62.81)   Activity Tolerance Patient tolerated treatment well   Patient Left in chair;with call bell/phone within reach;with chair alarm set   Nurse Communication Mobility status;Other (comment)(Pt had BM, left in chair on 7L HFNC)        Time: 2595-6387 OT Time Calculation (min): 38 min  Charges: OT General Charges $OT Visit: 1 Visit OT Treatments $Self Care/Home Management : 38-52 mins  Rockney Ghee, M.S., OTR/L Ascom: [phone] 12/31/19, 3:55 PM

## 2019-12-31 NOTE — Progress Notes (Signed)
PROGRESS NOTE    Marissa Luna  IOE:[id number] DOB: [birth year] DOA: 12/22/2019  PCP: Carlean Jews, NP    LOS - 17   Brief Narrative:  64 y.o.femalewith medical historyofsarcoidosis and COPD with chronic respiratory failure on2L/min homeO2, hypertension and recently hospitalizedfrom 11/30/19 - 12/30/20withCOVID-19pneumonia andacute on chronicrespiratory failure treated with 5 days of remdesivir and Decadron. She presented to the ED on 1/8with a complaintsof increased cough, weakness and shortness of breath.In the ED, afebrile, mildly tachycardic and tachypneic, hypoxic in 80's on room air. Labs notable for leukocytosis 17k, elevated inflammatory markers including D-dimer of 1572, ferritin 645, CRP 41.6. CT chest showed hazy groundglass opacities seen within both lungs consistent with COVID-19 pneumonia,and interval progression of sarcoidosis compared to 2011. She was started on IV antibiotics, dexamethasone andadmitted tohospitalistservice.  SUBJECTIVE: Felling alittle better, but sob not at baseline. Became sob with ambulation ealier. No other complaints.   Assessment & Plan:   Principal Problem:   Acute on chronic respiratory failure with hypoxia (HCC) Active Problems:   Sarcoidosis of lung (HCC)   Essential hypertension   Weakness   Healthcare-associated pneumonia   History of COVID-19   Acute respiratory failure (HCC)   Multifocal pneumonia   Type 2 diabetes mellitus without complication, without long-term current use of insulin (HCC)   Acute on chronic respiratory failure with hypoxia in setting of recent Covid-19 pneumonia, admitted 12/26 to 12/30. Completed treatment with 5 days remdesivir and 10 days Decadron. Hadbeenusing 2 L/min nocturnaloxygen atbaseline, now on 6 L high flow nasal cannula On 1/20 able to be weaned down to 4 L/min (after few days higher dose steroids).   Since 1/20 patient's oxygen requirement is slowly been  increasing Low suspicion for acute infection, suspect that background of sarcoid is primary driving factor Today she appears a little better, will try to wean oxygen down spoke to nursing about this Per pulmonology there is some concern opportunistic etiology due to chronic steroid, started on Bactrim prophylaxis Started on Imuran Continue IV steroids beta-D-glucan serology and fungal ab workup ordered Bronchoscopy was discussed with the patient for airway inspection and BAL to evaluate for yeastform/hyphae, microbiology cultures and staining -patient has agreed Pulmonology added MetaNEB Per previous hospitalist note Dr. Park Breed, patient's pulmonologist indicated that respiratory recovery may be a prolonged process and the patient may benefit from placement in a long-term acute care facility PT/OT-rec. SNF Will f/u any new pulmonary rec.    Bilateral lobar pneumonia In setting of recent COVID-19 pneumonia.  Completed course of Zithromaxand Rocephin.   Asthma and COPD overlap syndrome Continue on Solu-Medrol 40 mg twice daily DuoNeb Combivent MDI and Breo Ellipta Incentive spirometer   Sarcoidosis Noted to have progressed since 2011 on CT scan obtained on admission. Appears quite extensive and severe on personal review of CT scan. --Continue steroids as above --F/u with pulmonary rec. As above  Type 2 diabetes Hyperglycemia - secondary to steroids A1c 8.0%. Not on medications for diabetes as outpatient. --continueLantus 12units at bedtime --NovoLog19units with TID with meals  - ck fs  , riss --Should be discharged on an oral medication and follow-up with PCP closely --Carb modified diet --Diabetes coordinator consulted as patient likely to need insulin on discharge given long steroid taper?  Back Pain  likely from immobility this admission --heating pad --Norco PRN --Robaxin PRN  Essential hypertension chronic, stable -Continue Norvasc  Restless leg  syndrome chronic, stable -Continue Requip  Generalized weakness secondary to recent acute illnesses and underlying comorbidities. PT/OT rec.  snf   DVT prophylaxis:Lovenox Code Status: Full Code Family Communication:none at bedside Disposition Plan:SNF pending, medically not ready as she is on IV steroids and quite breathless still, will need to continue current medical management until we are able to wean her oxygen down as she still is requiring higher HFNC    Consultants:  Pulmonology  Procedures:  None  Antimicrobials:  Rocephincompleted  Zithromax completed  SUBJECTIVE: Feeling better today. Sob with ambulation still. Sob not even close to baseline yet. No other complaints  Objective: Vitals:   12/30/19 0433 12/30/19 1149 12/30/19 2120 12/31/19 0534  BP: 129/69 129/64 117/70 131/68  Pulse: 100 (!) 103 100 97  Resp: 20  (!) 24 20  Temp: 98.5 F (36.9 C) (!) 97.5 F (36.4 C) 97.7 F (36.5 C) 97.8 F (36.6 C)  TempSrc: Oral Oral Oral Oral  SpO2: 99% 95% 95% 95%  Weight:      Height:        Intake/Output Summary (Last 24 hours) at 12/31/2019 0744 Last data filed at 12/31/2019 0534 Gross per 24 hour  Intake 360 ml  Output 850 ml  Net -490 ml   Filed Weights   12/16/2019 2252 12/24/19 1552  Weight: 71.2 kg 70.6 kg    Examination:  General exam: laying in bed, nad, less breathless Respiratory system: more clear, no wheezing  cardiovascular system:RRR. No murmurs, rubs, gallops. Gastrointestinal system: Abdomen is nondistended, soft and nontender.  Normal bowel sounds heard. Central nervous system: Alert and oriented. No focal neurological deficits. Extremities: No edema Skin: Warm dry Psychiatry: Judgement and insight appear normal. Mood & affect appropriate.    Data Reviewed: I have personally reviewed following labs and imaging studies  CBC: Recent Labs  Lab 12/26/19 0456 12/28/19 0509  WBC 14.6* 12.1*  NEUTROABS 12.6*  --    HGB 12.1 11.0*  HCT 38.9 35.1*  MCV 91.5 90.9  PLT 178 409   Basic Metabolic Panel: Recent Labs  Lab 12/26/19 0456 12/27/19 0523 12/28/19 0509  NA 145 145 146*  K 4.5 5.0 4.3  CL 103 105 105  CO2 31 30 31   GLUCOSE 97 185* 96  BUN 31* 39* 33*  CREATININE 0.65 0.94 0.73  0.67  CALCIUM 8.5* 8.3* 8.5*   GFR: Estimated Creatinine Clearance: 60.8 mL/min (by C-G formula based on SCr of 0.67 mg/dL). Liver Function Tests: No results for input(s): AST, ALT, ALKPHOS, BILITOT, PROT, ALBUMIN in the last 168 hours. No results for input(s): LIPASE, AMYLASE in the last 168 hours. No results for input(s): AMMONIA in the last 168 hours. Coagulation Profile: No results for input(s): INR, PROTIME in the last 168 hours. Cardiac Enzymes: No results for input(s): CKTOTAL, CKMB, CKMBINDEX, TROPONINI in the last 168 hours. BNP (last 3 results) No results for input(s): PROBNP in the last 8760 hours. HbA1C: No results for input(s): HGBA1C in the last 72 hours. CBG: Recent Labs  Lab 12/29/19 2123 12/30/19 0754 12/30/19 1147 12/30/19 1612 12/30/19 2116  GLUCAP 73 184* 165* 279* 212*   Lipid Profile: No results for input(s): CHOL, HDL, LDLCALC, TRIG, CHOLHDL, LDLDIRECT in the last 72 hours. Thyroid Function Tests: No results for input(s): TSH, T4TOTAL, FREET4, T3FREE, THYROIDAB in the last 72 hours. Anemia Panel: No results for input(s): VITAMINB12, FOLATE, FERRITIN, TIBC, IRON, RETICCTPCT in the last 72 hours. Sepsis Labs: No results for input(s): PROCALCITON, LATICACIDVEN in the last 168 hours.  No results found for this or any previous visit (from the past 240 hour(s)).  Radiology Studies: No results found.      Scheduled Meds: . amLODipine  10 mg Oral Daily  . ascorbic acid  500 mg Oral Daily  . azaTHIOprine  100 mg Oral Daily  . calcium-vitamin D  1 tablet Oral Daily  . enoxaparin (LOVENOX) injection  40 mg Subcutaneous Q24H  . famotidine  20 mg Oral Daily  .  feeding supplement (ENSURE ENLIVE)  237 mL Oral BID BM  . fluticasone  2 spray Each Nare Daily  . fluticasone furoate-vilanterol  1 puff Inhalation Daily  . folic acid  1 mg Oral Daily  . furosemide  20 mg Oral Daily  . insulin aspart  0-5 Units Subcutaneous QHS  . insulin aspart  0-9 Units Subcutaneous TID WC  . insulin aspart  19 Units Subcutaneous TID WC  . insulin glargine  12 Units Subcutaneous QHS  . Ipratropium-Albuterol  1 puff Inhalation Q6H  . loratadine  10 mg Oral Daily  . methylPREDNISolone (SOLU-MEDROL) injection  40 mg Intravenous Q12H  . multivitamin with minerals  1 tablet Oral Daily  . rOPINIRole  0.25 mg Oral QHS  . sulfamethoxazole-trimethoprim  1 tablet Oral Q12H   Continuous Infusions: . sodium chloride 20 mL (12/18/19 1006)     LOS: 17 days    Time spent: 45 minutes with more than 50% COC    Lynn Ito, MD [facility] If 7PM-7AM, please contact night-coverage [email or web address] 12/31/2019, 7:44 AM

## 2019-12-31 NOTE — Progress Notes (Signed)
Pulmonary Medicine          Date: 12/31/2019,   MRN# [id number] Marissa Luna Jul 27, 1956     AdmissionWeight: 71.2 kg                 CurrentWeight: 70.6 kg      CHIEF COMPLAINT:   Increased oxygen requirement status post COVID-19 pneumonia   SUBJECTIVE   Patient reports mild clinical improvement.  She will need prolonged recovery time due to chronic lung disease.  I have discussed her care plan with Primary pulmonologist Dr Welton Flakes and his staff will follow up with patient post d/c.    PAST MEDICAL HISTORY   Past Medical History:  Diagnosis Date  . Asthma   . Environmental allergies   . Hypertension   . Sarcoidosis      SURGICAL HISTORY   Past Surgical History:  Procedure Laterality Date  . CHOLECYSTECTOMY    . COLONOSCOPY N/A 10/26/2015   Procedure: COLONOSCOPY;  Surgeon: Wallace Cullens, MD;  Location: [facility] ENDOSCOPY;  Service: Gastroenterology;  Laterality: N/A;     FAMILY HISTORY   Family History  Problem Relation Age of Onset  . Lung cancer Mother   . Colon cancer Father   . Diabetes Sister   . Breast cancer Sister      SOCIAL HISTORY   Social History   Tobacco Use  . Smoking status: Never Smoker  . Smokeless tobacco: Never Used  Substance Use Topics  . Alcohol use: No  . Drug use: No     MEDICATIONS    Home Medication:  Current Outpatient Rx  . Order #: [id number] Class: No Print  . Order #: [id number] Class: No Print  . Order #: [id number] Class: No Print  . Order #: [id number] Class: No Print  . Order #: [id number] Class: No Print  . Order #: [id number] Class: No Print  . Order #: [id number] Class: No Print  . Order #: [id number] Class: No Print  . Order #: [id number] Class: No Print  . Order #: [id number] Class: No Print  . Order #: [id number] Class: No Print  . Order #: [id number] Class: No Print  . Order #: [id number] Class: No Print  . Order #: [id number] Class: No Print  . Order #: [id number] Class: No Print  . Order #: [id number] Class: No  Print    Current Medication:  Current Facility-Administered Medications:  .  0.9 %  sodium chloride infusion, , Intravenous, PRN, Alford Highland, MD, Last Rate: 5 mL/hr at 12/18/19 1006, 20 mL at 12/18/19 1006 .  acetaminophen (TYLENOL) tablet 650 mg, 650 mg, Oral, Q4H PRN, [name], NP, 650 mg at 12/23/19 1747 .  amLODipine (NORVASC) tablet 10 mg, 10 mg, Oral, Daily, Renae Gloss, Richard, MD, 10 mg at 12/31/19 0958 .  ascorbic acid (VITAMIN C) tablet 500 mg, 500 mg, Oral, Daily, Renae Gloss, Richard, MD, 500 mg at 12/31/19 0957 .  azaTHIOprine (IMURAN) tablet 100 mg, 100 mg, Oral, Daily, Karna Christmas, Daelynn Blower, MD, 100 mg at 12/31/19 0958 .  calcium-vitamin D (OSCAL WITH D) 500-200 MG-UNIT per tablet 1 tablet, 1 tablet, Oral, Daily, Alford Highland, MD, 1 tablet at 12/31/19 0957 .  enoxaparin (LOVENOX) injection 40 mg, 40 mg, Subcutaneous, Q24H, Wieting, Richard, MD, 40 mg at 12/30/19 1723 .  famotidine (PEPCID) tablet 20 mg, 20 mg, Oral, Daily, Marylu Lund, Sahar, MD, 20 mg at 12/31/19 0957 .  feeding supplement (ENSURE ENLIVE) (ENSURE ENLIVE) liquid 237 mL, 237 mL, Oral, BID BM, Renae Gloss, Richard, MD, 237 mL at 12/31/19 1008 .  fluticasone (FLONASE) 50 MCG/ACT  nasal spray 2 spray, 2 spray, Each Nare, Daily, Alford Highland, MD, 2 spray at 12/31/19 1003 .  fluticasone furoate-vilanterol (BREO ELLIPTA) 100-25 MCG/INH 1 puff, 1 puff, Inhalation, Daily, 1 puff at 12/31/19 1002 **AND** [DISCONTINUED] umeclidinium bromide (INCRUSE ELLIPTA) 62.5 MCG/INH 1 puff, 1 puff, Inhalation, Daily, Alford Highland, MD, 1 puff at 12/24/19 0958 .  folic acid (FOLVITE) tablet 1 mg, 1 mg, Oral, Daily, Renae Gloss, Richard, MD, 1 mg at 12/31/19 0958 .  furosemide (LASIX) tablet 20 mg, 20 mg, Oral, Daily, Renae Gloss, Richard, MD, 20 mg at 12/31/19 0958 .  guaiFENesin-dextromethorphan (ROBITUSSIN DM) 100-10 MG/5ML syrup 10 mL, 10 mL, Oral, Q4H PRN, Alford Highland, MD, 10 mL at 12/30/19 2235 .  HYDROcodone-acetaminophen  (NORCO/VICODIN) 5-325 MG per tablet 1-2 tablet, 1-2 tablet, Oral, Q6H PRN, Pennie Banter, DO, 2 tablet at 12/30/19 1746 .  insulin aspart (novoLOG) injection 0-5 Units, 0-5 Units, Subcutaneous, QHS, Alford Highland, MD, 2 Units at 12/30/19 2235 .  insulin aspart (novoLOG) injection 0-9 Units, 0-9 Units, Subcutaneous, TID WC, Alford Highland, MD, 3 Units at 12/31/19 0957 .  insulin aspart (novoLOG) injection 19 Units, 19 Units, Subcutaneous, TID WC, Pennie Banter, DO, 19 Units at 12/31/19 0956 .  insulin glargine (LANTUS) injection 12 Units, 12 Units, Subcutaneous, QHS, Pennie Banter, DO, 12 Units at 12/30/19 2235 .  Ipratropium-Albuterol (COMBIVENT) respimat 1 puff, 1 puff, Inhalation, Q6H, Alford Highland, MD, 1 puff at 12/31/19 0544 .  ipratropium-albuterol (DUONEB) 0.5-2.5 (3) MG/3ML nebulizer solution 3 mL, 3 mL, Nebulization, Q4H PRN, Sreenath, Sudheer B, MD .  loratadine (CLARITIN) tablet 10 mg, 10 mg, Oral, Daily, Renae Gloss, Richard, MD, 10 mg at 12/31/19 0958 .  methocarbamol (ROBAXIN) tablet 1,000 mg, 1,000 mg, Oral, Q8H PRN, Esaw Grandchild A, DO, 1,000 mg at 12/27/19 1125 .  methylPREDNISolone sodium succinate (SOLU-MEDROL) 40 mg/mL injection 40 mg, 40 mg, Intravenous, Q12H, Sreenath, Sudheer B, MD, 40 mg at 12/31/19 0008 .  multivitamin with minerals tablet 1 tablet, 1 tablet, Oral, Daily, Alford Highland, MD, 1 tablet at 12/31/19 0957 .  rOPINIRole (REQUIP) tablet 0.25 mg, 0.25 mg, Oral, QHS, Wieting, Richard, MD, 0.25 mg at 12/30/19 2236 .  sodium chloride (OCEAN) 0.65 % nasal spray 1 spray, 1 spray, Each Nare, PRN, Wieting, Richard, MD .  sulfamethoxazole-trimethoprim (BACTRIM) 400-80 MG per tablet 1 tablet, 1 tablet, Oral, Q12H, Vida Rigger, MD, 1 tablet at 12/31/19 0957    ALLERGIES   Patient has no known allergies.     REVIEW OF SYSTEMS    Review of Systems:  Gen:  Denies  fever, sweats, chills weigh loss  HEENT: Denies blurred vision, double vision,  ear pain, eye pain, hearing loss, nose bleeds, sore throat Cardiac:  No dizziness, chest pain or heaviness, chest tightness,edema Resp:   +cough or sputum porduction, shortness of breath, Gi: Denies swallowing difficulty, stomach pain, nausea or vomiting, diarrhea, constipation, bowel incontinence Gu:  Denies bladder incontinence, burning urine Ext:   Denies Joint pain, stiffness or swelling Skin: Denies  skin rash, easy bruising or bleeding or hives Endoc:  Denies polyuria, polydipsia , polyphagia or weight change Psych:   Denies depression, insomnia or hallucinations   Other:  All other systems negative   VS: BP 131/68 (BP Location: Left Arm)   Pulse 97   Temp 97.8 F (36.6 C) (Oral)   Resp 20   Ht 4\' 11"  (1.499 m)   Wt 70.6 kg   SpO2 95%   BMI 31.45 kg/m  PHYSICAL EXAM    GENERAL:NAD, no fevers, chills, no weakness no fatigue HEAD: Normocephalic, atraumatic.  EYES: Pupils equal, round, reactive to light. Extraocular muscles intact. No scleral icterus.  MOUTH: Moist mucosal membrane. Dentition intact. No abscess noted.  EAR, NOSE, THROAT: Clear without exudates. No external lesions.  NECK: Supple. No thyromegaly. No nodules. No JVD.  PULMONARY: mildly rhonchorous breath sounds with decraesed air entry b/l CARDIOVASCULAR: S1 and S2. Regular rate and rhythm. No murmurs, rubs, or gallops. No edema. Pedal pulses 2+ bilaterally.  GASTROINTESTINAL: Soft, nontender, nondistended. No masses. Positive bowel sounds. No hepatosplenomegaly.  MUSCULOSKELETAL: No swelling, clubbing, or edema. Range of motion full in all extremities.  NEUROLOGIC: Cranial nerves II through XII are intact. No gross focal neurological deficits. Sensation intact. Reflexes intact.  SKIN: No ulceration, lesions, rashes, or cyanosis. Skin warm and dry. Turgor intact.  PSYCHIATRIC: Mood, affect within normal limits. The patient is awake, alert and oriented x 3. Insight, judgment intact.       IMAGING     CT Angio Chest PE W and/or Wo Contrast  Result Date: 12/14/2019 CLINICAL DATA:  Shortness of breath EXAM: CT ANGIOGRAPHY CHEST WITH CONTRAST TECHNIQUE: Multidetector CT imaging of the chest was performed using the standard protocol during bolus administration of intravenous contrast. Multiplanar CT image reconstructions and MIPs were obtained to evaluate the vascular anatomy. CONTRAST:  OMNIPAQUE IOHEXOL 350 MG/ML SOLN COMPARISON:  Apr 22, 2010 FINDINGS: Cardiovascular: There is a optimal opacification of the pulmonary arteries. There is no central,segmental, or subsegmental filling defects within the pulmonary arteries. There is mild cardiomegaly. No evidence of right ventricular heart strain. There is normal three-vessel brachiocephalic anatomy without proximal stenosis. The thoracic aorta is normal in appearance. Mediastinum/Nodes: Again noted are extensively calcified mediastinal and bilateral hilar lymph nodes. Thyroid gland, trachea, and esophagus demonstrate no significant findings. Lungs/Pleura: There is extensive bilateral bronchial wall thickening and bronchiectasis with honeycombing most notable in the posterior lower lungs with subpleural bleb formation. There has been interval progression since the prior exam dating back to 20/11. There is new hazy/ground-glass opacity seen within the periphery of the bilateral lungs, left slightly greater than right. No pleural effusion is seen. Upper Abdomen: No acute abnormalities present in the visualized portions of the upper abdomen. Musculoskeletal: No chest wall abnormality. No acute or significant osseous findings. Review of the MIP images confirms the above findings. IMPRESSION: 1. No central, segmental, or subsegmental pulmonary embolism. 2. Extensive bilateral hilar and mediastinal calcified adenopathy with extensive posterior lower lung bronchiectasis, honeycombing, and subpleural bleb formation. There has been interval progression in the lung  changes since the prior exam, consistent with progression of the patient's known sarcoidosis. 3. Hazy/ground-glass opacities seen within both lungs, left greater than right, consistent with COVID pneumonia. 4. Mild cardiomegaly Electronically Signed   By: Jonna Clark M.D.   On: 12/14/2019 01:18   DG Chest Port 1 View  Result Date: 12/28/2019 CLINICAL DATA:  Acute on chronic respiratory failure with hypoxia secondary to COVID. COVID positive on 11/30/2019. EXAM: PORTABLE CHEST 1 VIEW COMPARISON:  Chest x-ray dated 12/28/2019. FINDINGS: Persistent opacities within the mid/upper lungs bilaterally, compatible with previously described COVID pneumonia, superimposed on extensive chronic interstitial lung disease/fibrosis. No appreciable change compared to the most recent chest x-ray of 12/21/2019. No pleural effusion or pneumothorax is seen. Heart size and mediastinal contours are stable. No acute or suspicious osseous finding. IMPRESSION: Stable chest x-ray. Persistent opacities within the mid/upper lungs bilaterally, compatible with previously described COVID  pneumonia superimposed on extensive chronic interstitial lung disease/fibrosis. Electronically Signed   By: Franki Cabot M.D.   On: 12/28/2019 12:54   DG Chest Port 1 View  Result Date: 12/23/2019 CLINICAL DATA:  64 year old female with shortness of breath. EXAM: PORTABLE CHEST 1 VIEW COMPARISON:  Chest radiograph dated 11/30/2019. FINDINGS: Background of fibrotic changes primarily involving the left mid to lower lung field. Slight increased density primarily over the left lung may be chronic but appears more prominent compared to the prior radiograph and developing infiltrate is not excluded. Clinical correlation is recommended. No pleural effusion or pneumothorax. The cardiac silhouette is within normal limits. Calcified hilar and mediastinal granuloma. No acute osseous pathology. IMPRESSION: 1. Slight increased density primarily in the left mid to lower  lung field. Developing infiltrate is not excluded. Clinical correlation is recommended. 2. Calcified hilar and mediastinal granuloma. Electronically Signed   By: Anner Crete M.D.   On: 12/12/2019 23:28    Below images are independently reviewed by me.           - - CT CHEST - interval progression of sarcoidosis stage 4 with worsening pulmonary fibrosis bilaterally over 10 years     -Chest x-ray-compared to December 26 current CXR with worsening airspace opacification noted at left upper lobe and lower peripherally as well as right middle lobe.  ASSESSMENT/PLAN   Acute on chronic hypoxemic respiratory failure  - patient has had improvement with recurrent decline on short interval follow up with re-admission 2 times in last 30 days -Post COVID pneumonitis - continue current care - some concern for opportunistic etiology due to relative immunocompromised state from chronic steroids and recent multiple bursts of high dose IV and PO steroids - She has no peripheral edema and very mild bibasilar crackles pointing to less likely interstitial edema as cause - Specifically she may develop PJP and will need at minimum bactrim for PPX -  beta-D-glucan serology and fungal ab workup -in process - discussed bronchoscopy for airway inspection and BAL to evaluate for yeastform/hyphae, microbiology cultures and staining - patient is agreeable - there is decreased air entry - may need recruitment maneuvers - continue MetaNEB   Pulmonary Sarcoidosis Stage IV  - despite chronic gluccocorticoids patients fibrosis seems to be progressing as evidenced by serial imaging above - we have discussed adding steroid sparing immunomodulator and patient is agreeable - will start Imuran today  - patient is advised to review changes with primary pulmonologist - Dr Junious Dresser to see if he is in agreement and any further recommendations   Asthma and COPD overlap syndrome (ACOS)   - patient currently on  solumedrol - 40 q 12h - will plan to wean as able  - agree with DuoNEb  - combivent MDI , breo-ellipta   -encourage use of IS and participate in PT/OT          Thank you for allowing me to participate in the care of this patient.   Patient/Family are satisfied with care plan and all questions have been answered.  This document was prepared using Dragon voice recognition software and may include unintentional dictation errors.     Ottie Glazier, M.D.  Division of [place]

## 2020-01-01 ENCOUNTER — Inpatient Hospital Stay: Payer: 59

## 2020-01-01 LAB — GLUCOSE, CAPILLARY
Glucose-Capillary: 266 mg/dL — ABNORMAL HIGH (ref 70–99)
Glucose-Capillary: 178 mg/dL — ABNORMAL HIGH (ref 70–99)
Glucose-Capillary: 83 mg/dL (ref 70–99)
Glucose-Capillary: 220 mg/dL — ABNORMAL HIGH (ref 70–99)

## 2020-01-01 LAB — ASPERGILLUS ANTIGEN, BAL/SERUM: Aspergillus Ag, BAL/Serum: 0.05 Index (ref 0.00–0.49)

## 2020-01-01 LAB — FUNGITELL, SERUM: Fungitell Result: 55 pg/mL (ref <80)

## 2020-01-01 MED ORDER — PREDNISONE 20 MG PO TABS
40.0000 mg | ORAL_TABLET | Freq: Every day | ORAL | Status: DC
  Administered 2020-01-02 – 2020-01-03 (×2): 40 mg via ORAL
  Filled 2020-01-01 (×2): qty 2

## 2020-01-01 NOTE — Progress Notes (Signed)
Inpatient Diabetes Program Recommendations  AACE/ADA: New Consensus Statement on Inpatient Glycemic Control (2015)  Target Ranges:  Prepandial:   less than 140 mg/dL      Peak postprandial:   less than 180 mg/dL (1-2 hours)      Critically ill patients:  140 - 180 mg/dL   Lab Results  Component Value Date   GLUCAP 266 (H) 01/01/2020   HGBA1C 8.0 (H) 12/14/2019    Review of Glycemic Control   Results for LAVAEH, BAU (MRN [id number]) as of 01/01/2020 09:39  Ref. Range 12/31/2019 21:34 01/01/2020 07:29  Glucose-Capillary Latest Ref Range: 70 - 99 mg/dL 258 (H) 527 (H)   Diabetes history: None Outpatient Diabetes medications: None Current orders for Inpatient glycemic control: Lantus 12 units QD + Novolog 0-9 TID + 0-5 QHS + Novolog 19 units TID with meals + Solumedrol 40 mg Q12H   Inpatient Diabetes Program Recommendations:  Please consider  -Lantus 16 units daily  Thank you, Dulce Sellar, RN, BSN Diabetes Coordinator Inpatient Diabetes Program [phone] (team pager from 8a-5p)

## 2020-01-01 NOTE — Progress Notes (Addendum)
[facility] [place] NAME: Marissa Luna    MR#:  [id number]  DATE OF BIRTH:  [birth year]  SUBJECTIVE:  patient complaining of left hip pain starting from the buttock and going out in the groin area exertional shortness of breath reports using her flutter valve and incentive spirometry  REVIEW OF SYSTEMS:   Review of Systems  Constitutional: Negative for chills, fever and weight loss.  HENT: Negative for ear discharge, ear pain and nosebleeds.   Eyes: Negative for blurred vision, pain and discharge.  Respiratory: Positive for shortness of breath. Negative for sputum production, wheezing and stridor.   Cardiovascular: Negative for chest pain, palpitations, orthopnea and PND.  Gastrointestinal: Negative for abdominal pain, diarrhea, nausea and vomiting.  Genitourinary: Negative for frequency and urgency.  Musculoskeletal: Positive for joint pain. Negative for back pain.  Neurological: Positive for weakness. Negative for sensory change, speech change and focal weakness.  Psychiatric/Behavioral: Negative for depression and hallucinations. The patient is not nervous/anxious.    Tolerating Diet: yes Tolerating PT: rehab  DRUG ALLERGIES:  No Known Allergies  VITALS:  Blood pressure (!) 105/58, pulse 89, temperature 98.1 F (36.7 C), temperature source Oral, resp. rate 20, height 4\' 11"  (1.499 m), weight 70.6 kg, SpO2 93 %.  PHYSICAL EXAMINATION:   Physical Exam  GENERAL:  64 y.o.-year-old patient lying in the bed with no acute distress.  EYES: Pupils equal, round, reactive to light and accommodation. No scleral icterus.   HEENT: Head atraumatic, normocephalic. Oropharynx and nasopharynx clear.  NECK:  Supple, no jugular venous distention. No thyroid enlargement, no tenderness.  LUNGS: Normal breath sounds bilaterally, no wheezing, fine rales, no rhonchi. No use of accessory muscles of respiration.  CARDIOVASCULAR: S1, S2 normal. No murmurs,  rubs, or gallops.  ABDOMEN: Soft, nontender, nondistended. Bowel sounds present. No organomegaly or mass.  EXTREMITIES: No cyanosis, clubbing or edema b/l.    NEUROLOGIC: Cranial nerves II through XII are intact. No focal Motor or sensory deficits b/l.   PSYCHIATRIC:  patient is alert and oriented x 3.  SKIN: No obvious rash, lesion, or ulcer.   LABORATORY PANEL:  CBC Recent Labs  Lab 12/28/19 0509  WBC 12.1*  HGB 11.0*  HCT 35.1*  PLT 199    Chemistries  Recent Labs  Lab 12/28/19 0509  NA 146*  K 4.3  CL 105  CO2 31  GLUCOSE 96  BUN 33*  CREATININE 0.73  0.67  CALCIUM 8.5*   Cardiac Enzymes No results for input(s): TROPONINI in the last 168 hours. RADIOLOGY:  No results found. ASSESSMENT AND PLAN:  64 y.o.femalewith medical historyofsarcoidosis and COPD with chronic respiratory failure on2L/min homeO2, hypertension and recently hospitalizedfrom 11/30/19 - 12/30/20withCOVID-19pneumonia andacute on chronicrespiratory failure treated with 5 days of remdesivir and Decadron. She presented to the ED on 1/8with a complaintsof increased cough, weakness and shortness of breath  Acute on chronic respiratory failure with hypoxia in setting of recent Covid-19 pneumonia, admitted 12/26 to 12/30. Completed treatment with 5 days remdesivir and 10 days Decadron during that admisison -Low suspicion for acute infection, suspect that background of sarcoid is primary driving factor along with worsening scarring/interstitial lung disease superimposed with recent COVID infection -Per pulmonology Dr Lanney Gins there is some concern opportunistic etiology due to chronic steroid, started on Bactrim prophylaxis -change to oral taper -Started on Imuran by pulm -beta-D-glucan serology and fungal ab workup ordered -cont MetaNEB -PT/OT-rec. SNF -pt was requiring 10-15 Liters HFNC --now slowly weaned  down to 5 L [place] sats >88%  Bilateral lobar pneumonia In setting of recent  COVID-19 pneumonia.  Completed course of Zithromaxand Rocephin.  Asthma and COPD overlap syndrome Continue on Solu-Medrol 40 mg twice daily--change to oral taper DuoNeb Combivent MDI and Breo Ellipta Incentive spirometer/flutter valve  Sarcoidosis Noted to have progressed since 2011 on CT scan obtained on admission. Appears quite extensive and severe on personal review of CT scan. --Continue steroids as above --F/u with pulmonary rec. As above  Type 2 diabetes Hyperglycemia - secondary to steroids A1c 8.0%. Not on medications for diabetes as outpatient. --continueLantus 12units at bedtime --NovoLog19units with TID with meals  --ssi --Carb modified diet  Left hip apin radiating to the groin--?sciatica  likely from immobility this admission --heating pad --Norco PRN --Robaxin PRN -left hip xray -steroids should help  Essential hypertension chronic, stable -Continue Norvasc  Restless leg syndrome chronic, stable -Continue Requip  Generalized weakness secondary to recent acute illnesses and underlying comorbidities. PT/OT rec. snf   DVT prophylaxis:Lovenox Code Status: Full Code Family Communication:updated dter on the phone Marissa Luna Disposition Plan:anticipate d/c in 1 day .Awaiting insurance authorization and now that oxygen requirement is weaned down to 5-6 liter NCoxygen  TOTAL TIME TAKING CARE OF THIS PATIENT: *25* minutes.  >50% time spent on counselling and coordination of care  Note: This dictation was prepared with Dragon dictation along with smaller phrase technology. Any transcriptional errors that result from this process are unintentional.  Marissa Luna M.D    [facility]   CC: Primary care physician; Carlean Jews, NPPatient ID: Marissa Luna, female   DOB: 1956-08-09, 64 y.o.   MRN: [id number]

## 2020-01-01 NOTE — TOC Progression Note (Signed)
Transition of Care [facility]) - Progression Note    Patient Details  Name: Marissa Luna MRN: [id number] Date of Birth: [birth year]  [facility] CM/SW Contact  Chapman Fitch, RN Phone Number: 01/01/2020, 2:19 PM  Clinical Narrative:    Per MD potential discharge for tomorrow  Kerri at [facility] notified   Expected Discharge Plan: Home w Home Health Services    Expected Discharge Plan and Services Expected Discharge Plan: Home w Home Health Services   Discharge Planning Services: CM Consult Post Acute Care Choice: Home Health, Durable Medical Equipment Living arrangements for the past 2 months: Single Family Home Expected Discharge Date: 12/19/19               DME Arranged: Dan Humphreys rolling DME Agency: [employer]       HH Arranged: RN, PT HH Agency: [facility] Date HH Agency Contacted: 12/17/19   Representative spoke with at [facility] Agency: Barbara Cower   Social Determinants of Health (SDOH) Interventions    Readmission Risk Interventions No flowsheet data found.

## 2020-01-02 ENCOUNTER — Inpatient Hospital Stay: Payer: 59

## 2020-01-02 LAB — GLUCOSE, CAPILLARY
Glucose-Capillary: 276 mg/dL — ABNORMAL HIGH (ref 70–99)
Glucose-Capillary: 46 mg/dL — ABNORMAL LOW (ref 70–99)
Glucose-Capillary: 51 mg/dL — ABNORMAL LOW (ref 70–99)
Glucose-Capillary: 165 mg/dL — ABNORMAL HIGH (ref 70–99)
Glucose-Capillary: 307 mg/dL — ABNORMAL HIGH (ref 70–99)
Glucose-Capillary: 286 mg/dL — ABNORMAL HIGH (ref 70–99)
Glucose-Capillary: 158 mg/dL — ABNORMAL HIGH (ref 70–99)

## 2020-01-02 MED ORDER — INSULIN ASPART 100 UNIT/ML ~~LOC~~ SOLN
12.0000 [IU] | Freq: Three times a day (TID) | SUBCUTANEOUS | Status: DC
  Administered 2020-01-03 – 2020-01-05 (×8): 12 [IU] via SUBCUTANEOUS
  Filled 2020-01-02 (×9): qty 1

## 2020-01-02 MED ORDER — AMLODIPINE BESYLATE 5 MG PO TABS
5.0000 mg | ORAL_TABLET | Freq: Every day | ORAL | Status: DC
  Administered 2020-01-03 – 2020-01-05 (×3): 5 mg via ORAL
  Filled 2020-01-02 (×3): qty 1

## 2020-01-02 NOTE — Progress Notes (Signed)
PT Cancellation Note  Patient Details Name: Marissa Luna MRN: [id number] DOB: 12-21-1955   Cancelled Treatment:    Reason Eval/Treat Not Completed: Medical issues which prohibited therapy.  Chart reviewed.  Pt's HR noted to be elevated around low 120's bpm on telemetry screen; nurse also reporting increased O2 needs today.  Per discussion with pt's nurse, will hold PT at this time d/t elevated HR and respiratory concerns.  Will re-attempt PT treatment session at a later date/time as medically appropriate.  Hendricks Limes, PT 01/02/20, 11:52 AM

## 2020-01-02 NOTE — Progress Notes (Signed)
Pulmonary Medicine          Date: 01/02/2020,   MRN# [id number] Marissa Luna 1956/11/10     AdmissionWeight: 71.2 kg                 CurrentWeight: 70.6 kg      CHIEF COMPLAINT:   Increased oxygen requirement status post COVID-19 pneumonia   SUBJECTIVE   Patient had episode of respiratory distress with desaturation overnight. CXR done today with minimal changes compared to 12/28/19 study.  She does have crackles on auscultation and 1-2+ pitting edema of legs. We discussed diuresis over next day, patient is net >10L negative with diuresis thus far.  Additionally we will upgrade her BPH regimen to [facility] and have reviewed importance of working with RT to recruit atelectatic segments and wean O2 to home settings.   PAST MEDICAL HISTORY   Past Medical History:  Diagnosis Date  . Asthma   . Environmental allergies   . Hypertension   . Sarcoidosis      SURGICAL HISTORY   Past Surgical History:  Procedure Laterality Date  . CHOLECYSTECTOMY    . COLONOSCOPY N/A 10/26/2015   Procedure: COLONOSCOPY;  Surgeon: Wallace Cullens, MD;  Location[facility] ENDOSCOPY;  Service: Gastroenterology;  Laterality: N/A;     FAMILY HISTORY   Family History  Problem Relation Age of Onset  . Lung cancer Mother   . Colon cancer Father   . Diabetes Sister   . Breast cancer Sister      SOCIAL HISTORY   Social History   Tobacco Use  . Smoking status: Never Smoker  . Smokeless tobacco: Never Used  Substance Use Topics  . Alcohol use: No  . Drug use: No     MEDICATIONS    Home Medication:  Current Outpatient Rx  . Order #: [id number] Class: No Print  . Order #: [id number] Class: No Print  . Order #: [id number] Class: No Print  . Order #: [id number] Class: No Print  . Order #: [id number] Class: No Print  . Order #: [id number] Class: No Print  . Order #: [id number] Class: No Print  . Order #: [id number] Class: No Print  . Order #: [id number] Class: No Print  . Order #: [id number] Class:  No Print  . Order #: [id number] Class: No Print  . Order #: [id number] Class: No Print  . Order #: [id number] Class: No Print  . Order #: [id number] Class: No Print  . Order #: [id number] Class: No Print  . Order #: [id number] Class: No Print    Current Medication:  Current Facility-Administered Medications:  .  0.9 %  sodium chloride infusion, , Intravenous, PRN, Alford Highland, MD, Stopped at 12/30/19 1728 .  acetaminophen (TYLENOL) tablet 650 mg, 650 mg, Oral, Q4H PRN, [name], NP, 650 mg at 01/01/20 1411 .  amLODipine (NORVASC) tablet 10 mg, 10 mg, Oral, Daily, Renae Gloss, Richard, MD, 10 mg at 12/31/19 0958 .  ascorbic acid (VITAMIN C) tablet 500 mg, 500 mg, Oral, Daily, Wieting, Richard, MD, 500 mg at 01/01/20 0914 .  azaTHIOprine (IMURAN) tablet 100 mg, 100 mg, Oral, Daily, Karna Christmas, Albertine Lafoy, MD, 100 mg at 01/02/20 0859 .  calcium-vitamin D (OSCAL WITH D) 500-200 MG-UNIT per tablet 1 tablet, 1 tablet, Oral, Daily, Alford Highland, MD, 1 tablet at 01/01/20 0914 .  enoxaparin (LOVENOX) injection 40 mg, 40 mg, Subcutaneous, Q24H, Wieting, Richard, MD, 40 mg at 01/01/20 2028 .  famotidine (PEPCID) tablet 20 mg, 20 mg, Oral, Daily, Lynn Ito, MD, 20 mg at 01/02/20 0859 .  feeding  supplement (ENSURE ENLIVE) (ENSURE ENLIVE) liquid 237 mL, 237 mL, Oral, BID BM, Leslye Peer, Richard, MD, 237 mL at 12/31/19 1008 .  fluticasone (FLONASE) 50 MCG/ACT nasal spray 2 spray, 2 spray, Each Nare, Daily, Loletha Grayer, MD, 2 spray at 01/01/20 [phone]  fluticasone furoate-vilanterol (BREO ELLIPTA) 100-25 MCG/INH 1 puff, 1 puff, Inhalation, Daily, 1 puff at 01/02/20 0853 **AND** [DISCONTINUED] umeclidinium bromide (INCRUSE ELLIPTA) 62.5 MCG/INH 1 puff, 1 puff, Inhalation, Daily, Loletha Grayer, MD, 1 puff at 12/24/19 0958 .  folic acid (FOLVITE) tablet 1 mg, 1 mg, Oral, Daily, Wieting, Richard, MD, 1 mg at 01/01/20 2353 .  furosemide (LASIX) tablet 20 mg, 20 mg, Oral, Daily, Leslye Peer, Richard, MD, 20 mg at  01/02/20 0901 .  guaiFENesin-dextromethorphan (ROBITUSSIN DM) 100-10 MG/5ML syrup 10 mL, 10 mL, Oral, Q4H PRN, Loletha Grayer, MD, 10 mL at 01/02/20 0320 .  HYDROcodone-acetaminophen (NORCO/VICODIN) 5-325 MG per tablet 1-2 tablet, 1-2 tablet, Oral, Q6H PRN, Ezekiel Slocumb, DO, 1 tablet at 01/01/20 2214 .  insulin aspart (novoLOG) injection 0-5 Units, 0-5 Units, Subcutaneous, QHS, Loletha Grayer, MD, 2 Units at 01/01/20 2221 .  insulin aspart (novoLOG) injection 0-9 Units, 0-9 Units, Subcutaneous, TID WC, Loletha Grayer, MD, 5 Units at 01/02/20 0854 .  insulin aspart (novoLOG) injection 19 Units, 19 Units, Subcutaneous, TID WC, Ezekiel Slocumb, DO, 19 Units at 01/02/20 0854 .  insulin glargine (LANTUS) injection 12 Units, 12 Units, Subcutaneous, QHS, Ezekiel Slocumb, DO, 12 Units at 01/01/20 2222 .  Ipratropium-Albuterol (COMBIVENT) respimat 1 puff, 1 puff, Inhalation, Q6H, Loletha Grayer, MD, 1 puff at 01/02/20 [phone] .  ipratropium-albuterol (DUONEB) 0.5-2.5 (3) MG/3ML nebulizer solution 3 mL, 3 mL, Nebulization, Q4H PRN, Sreenath, Sudheer B, MD .  loratadine (CLARITIN) tablet 10 mg, 10 mg, Oral, Daily, Leslye Peer, Richard, MD, 10 mg at 01/02/20 0858 .  methocarbamol (ROBAXIN) tablet 1,000 mg, 1,000 mg, Oral, Q8H PRN, Nicole Kindred A, DO, 1,000 mg at 01/02/20 0859 .  multivitamin with minerals tablet 1 tablet, 1 tablet, Oral, Daily, Loletha Grayer, MD, 1 tablet at 01/01/20 0914 .  predniSONE (DELTASONE) tablet 40 mg, 40 mg, Oral, Q breakfast, Fritzi Mandes, MD, 40 mg at 01/02/20 0858 .  rOPINIRole (REQUIP) tablet 0.25 mg, 0.25 mg, Oral, QHS, Wieting, Richard, MD, 0.25 mg at 01/01/20 2225 .  sodium chloride (OCEAN) 0.65 % nasal spray 1 spray, 1 spray, Each Nare, PRN, Wieting, Richard, MD .  sulfamethoxazole-trimethoprim (BACTRIM) 400-80 MG per tablet 1 tablet, 1 tablet, Oral, Q12H, Ottie Glazier, MD, 1 tablet at 01/02/20 0900    ALLERGIES   Patient has no known  allergies.     REVIEW OF SYSTEMS    Review of Systems:  Gen:  Denies  fever, sweats, chills weigh loss  HEENT: Denies blurred vision, double vision, ear pain, eye pain, hearing loss, nose bleeds, sore throat Cardiac:  No dizziness, chest pain or heaviness, chest tightness,edema Resp:   +cough or sputum porduction, shortness of breath, Gi: Denies swallowing difficulty, stomach pain, nausea or vomiting, diarrhea, constipation, bowel incontinence Gu:  Denies bladder incontinence, burning urine Ext:   Denies Joint pain, stiffness or swelling Skin: Denies  skin rash, easy bruising or bleeding or hives Endoc:  Denies polyuria, polydipsia , polyphagia or weight change Psych:   Denies depression, insomnia or hallucinations   Other:  All other systems negative   VS: BP 90/60 (BP Location: Left Arm)   Pulse (!) 119   Temp 98.6 F (37 C) (Oral)   Resp 20  Ht 4\' 11"  (1.499 m)   Wt 70.6 kg   SpO2 96%   BMI 31.45 kg/m      PHYSICAL EXAM    GENERAL:NAD, no fevers, chills, no weakness no fatigue HEAD: Normocephalic, atraumatic.  EYES: Pupils equal, round, reactive to light. Extraocular muscles intact. No scleral icterus.  MOUTH: Moist mucosal membrane. Dentition intact. No abscess noted.  EAR, NOSE, THROAT: Clear without exudates. No external lesions.  NECK: Supple. No thyromegaly. No nodules. No JVD.  PULMONARY: mildly rhonchorous breath sounds with decraesed air entry b/l CARDIOVASCULAR: S1 and S2. Regular rate and rhythm. No murmurs, rubs, or gallops. No edema. Pedal pulses 2+ bilaterally.  GASTROINTESTINAL: Soft, nontender, nondistended. No masses. Positive bowel sounds. No hepatosplenomegaly.  MUSCULOSKELETAL: No swelling, clubbing, or edema. Range of motion full in all extremities.  NEUROLOGIC: Cranial nerves II through XII are intact. No gross focal neurological deficits. Sensation intact. Reflexes intact.  SKIN: No ulceration, lesions, rashes, or cyanosis. Skin warm and  dry. Turgor intact.  PSYCHIATRIC: Mood, affect within normal limits. The patient is awake, alert and oriented x 3. Insight, judgment intact.       IMAGING    CT Angio Chest PE W and/or Wo Contrast  Result Date: 12/14/2019 CLINICAL DATA:  Shortness of breath EXAM: CT ANGIOGRAPHY CHEST WITH CONTRAST TECHNIQUE: Multidetector CT imaging of the chest was performed using the standard protocol during bolus administration of intravenous contrast. Multiplanar CT image reconstructions and MIPs were obtained to evaluate the vascular anatomy. CONTRAST:  02/11/2020 OMNIPAQUE IOHEXOL 350 MG/ML SOLN COMPARISON:  Apr 22, 2010 FINDINGS: Cardiovascular: There is a optimal opacification of the pulmonary arteries. There is no central,segmental, or subsegmental filling defects within the pulmonary arteries. There is mild cardiomegaly. No evidence of right ventricular heart strain. There is normal three-vessel brachiocephalic anatomy without proximal stenosis. The thoracic aorta is normal in appearance. Mediastinum/Nodes: Again noted are extensively calcified mediastinal and bilateral hilar lymph nodes. Thyroid gland, trachea, and esophagus demonstrate no significant findings. Lungs/Pleura: There is extensive bilateral bronchial wall thickening and bronchiectasis with honeycombing most notable in the posterior lower lungs with subpleural bleb formation. There has been interval progression since the prior exam dating back to 20/11. There is new hazy/ground-glass opacity seen within the periphery of the bilateral lungs, left slightly greater than right. No pleural effusion is seen. Upper Abdomen: No acute abnormalities present in the visualized portions of the upper abdomen. Musculoskeletal: No chest wall abnormality. No acute or significant osseous findings. Review of the MIP images confirms the above findings. IMPRESSION: 1. No central, segmental, or subsegmental pulmonary embolism. 2. Extensive bilateral hilar and mediastinal  calcified adenopathy with extensive posterior lower lung bronchiectasis, honeycombing, and subpleural bleb formation. There has been interval progression in the lung changes since the prior exam, consistent with progression of the patient's known sarcoidosis. 3. Hazy/ground-glass opacities seen within both lungs, left greater than right, consistent with COVID pneumonia. 4. Mild cardiomegaly Electronically Signed   By: Apr 24, 2010 M.D.   On: 12/14/2019 01:18   DG Chest Port 1 View  Result Date: 12/28/2019 CLINICAL DATA:  Acute on chronic respiratory failure with hypoxia secondary to COVID. COVID positive on 11/30/2019. EXAM: PORTABLE CHEST 1 VIEW COMPARISON:  Chest x-ray dated 2020-01-03. FINDINGS: Persistent opacities within the mid/upper lungs bilaterally, compatible with previously described COVID pneumonia, superimposed on extensive chronic interstitial lung disease/fibrosis. No appreciable change compared to the most recent chest x-ray of Jan 03, 2020. No pleural effusion or pneumothorax is seen. Heart size and mediastinal contours  are stable. No acute or suspicious osseous finding. IMPRESSION: Stable chest x-ray. Persistent opacities within the mid/upper lungs bilaterally, compatible with previously described COVID pneumonia superimposed on extensive chronic interstitial lung disease/fibrosis. Electronically Signed   By: Bary Richard M.D.   On: 12/28/2019 12:54   DG Chest Port 1 View  Result Date: December 29, 2019 CLINICAL DATA:  64 year old female with shortness of breath. EXAM: PORTABLE CHEST 1 VIEW COMPARISON:  Chest radiograph dated 11/30/2019. FINDINGS: Background of fibrotic changes primarily involving the left mid to lower lung field. Slight increased density primarily over the left lung may be chronic but appears more prominent compared to the prior radiograph and developing infiltrate is not excluded. Clinical correlation is recommended. No pleural effusion or pneumothorax. The cardiac silhouette is  within normal limits. Calcified hilar and mediastinal granuloma. No acute osseous pathology. IMPRESSION: 1. Slight increased density primarily in the left mid to lower lung field. Developing infiltrate is not excluded. Clinical correlation is recommended. 2. Calcified hilar and mediastinal granuloma. Electronically Signed   By: Elgie Collard M.D.   On: Dec 29, 2019 23:28   DG HIP UNILAT WITH PELVIS 2-3 VIEWS LEFT  Result Date: 01/01/2020 CLINICAL DATA:  Left hip pain. EXAM: DG HIP (WITH OR WITHOUT PELVIS) 2-3V LEFT COMPARISON:  None. FINDINGS: There is no evidence of hip fracture or dislocation. There is no evidence of arthropathy or other focal bone abnormality. IMPRESSION: Negative. Electronically Signed   By: Francene Boyers M.D.   On: 01/01/2020 12:51    Below images are independently reviewed by me.           - - CT CHEST - interval progression of sarcoidosis stage 4 with worsening pulmonary fibrosis bilaterally over 10 years          ASSESSMENT/PLAN   Acute on chronic hypoxemic respiratory failure  - patient has had improvement with recurrent decline on short interval follow up with re-admission 2 times in last 30 days -Post COVID pneumonitis - continue current care 40 prednisone and bactrim ss bid  - some concern for opportunistic etiology due to relative immunocompromised state from chronic steroids and recent multiple bursts of high dose IV and PO steroids - She has no peripheral edema and very mild bibasilar crackles pointing to less likely interstitial edema as cause-METANEB q4h - Specifically she may develop PJP and will need at minimum bactrim for PPX -  beta-D-glucan serology and fungal ab workup -in process - discussed bronchoscopy for airway inspection and BAL to evaluate for yeastform/hyphae, microbiology cultures and staining - patient is agreeable - there is decreased air entry - recruitment maneuvers - continue MetaNEB   Pulmonary Sarcoidosis Stage IV  - despite  chronic gluccocorticoids patients fibrosis seems to be progressing as evidenced by serial imaging above - we have discussed adding steroid sparing immunomodulator and patient is agreeable - will start Imuran today  - patient is advised to review changes with primary pulmonologist - Dr Forrest Moron to see if he is in agreement and any further recommendations   Asthma and COPD overlap syndrome (ACOS)   - patient currently on solumedrol - 40 q 12h - will plan to wean as able  - agree with DuoNEb  - combivent MDI , breo-ellipta   -encourage use of IS and participate in PT/OT          Thank you for allowing me to participate in the care of this patient.   Patient/Family are satisfied with care plan and all questions have been answered.  This document was prepared using Dragon voice recognition software and may include unintentional dictation errors.     Ottie Glazier, M.D.  Division of [facility]

## 2020-01-02 NOTE — Progress Notes (Signed)
[place] [facility] [place] NAME: Marissa Luna    MR#:  [id number]  DATE OF BIRTH:  [birth year]  SUBJECTIVE:  patient complaining worsening exertional shortness of breath since middle of the night. Sats dropped in the lower 80s with tachycardia and tachypnea RT placed patient on not rebreather. Denies chest pain. Has some cough.  REVIEW OF SYSTEMS:   Review of Systems  Constitutional: Negative for chills, fever and weight loss.  HENT: Negative for ear discharge, ear pain and nosebleeds.   Eyes: Negative for blurred vision, pain and discharge.  Respiratory: Positive for shortness of breath. Negative for sputum production, wheezing and stridor.   Cardiovascular: Negative for chest pain, palpitations, orthopnea and PND.  Gastrointestinal: Negative for abdominal pain, diarrhea, nausea and vomiting.  Genitourinary: Negative for frequency and urgency.  Musculoskeletal: Positive for joint pain. Negative for back pain.  Neurological: Positive for weakness. Negative for sensory change, speech change and focal weakness.  Psychiatric/Behavioral: Negative for depression and hallucinations. The patient is not nervous/anxious.    Tolerating Diet: yes Tolerating PT: rehab  DRUG ALLERGIES:  No Known Allergies  VITALS:  Blood pressure 90/60, pulse (!) 119, temperature 98.6 F (37 C), temperature source Oral, resp. rate 20, height 4\' 11"  (1.499 m), weight 70.6 kg, SpO2 96 %.  PHYSICAL EXAMINATION:   Physical Exam  GENERAL:  64 y.o.-year-old patient lying in the bed with mild acute distress. Due to worsening shortness of breath EYES: Pupils equal, round, reactive to light and accommodation. No scleral icterus.   HEENT: Head atraumatic, normocephalic. Oropharynx and nasopharynx clear.  NECK:  Supple, no jugular venous distention. No thyroid enlargement, no tenderness.  LUNGS: coarse breath sounds bilaterally, no wheezing, fine rales bilaterally, no rhonchi. No  use of accessory muscles of respiration.  CARDIOVASCULAR: S1, S2 normal. No murmurs, rubs, or gallops. Tachycardia ABDOMEN: Soft, nontender, nondistended. Bowel sounds present. No organomegaly or mass.  EXTREMITIES: No cyanosis, clubbing or edema b/l.    NEUROLOGIC: Cranial nerves II through XII are intact. No focal Motor or sensory deficits b/l.   PSYCHIATRIC:  patient is alert and oriented x 3.  SKIN: No obvious rash, lesion, or ulcer.   LABORATORY PANEL:  CBC Recent Labs  Lab 12/28/19 0509  WBC 12.1*  HGB 11.0*  HCT 35.1*  PLT 199    Chemistries  Recent Labs  Lab 12/28/19 0509  NA 146*  K 4.3  CL 105  CO2 31  GLUCOSE 96  BUN 33*  CREATININE 0.73  0.67  CALCIUM 8.5*   Cardiac Enzymes No results for input(s): TROPONINI in the last 168 hours. RADIOLOGY:  DG HIP UNILAT WITH PELVIS 2-3 VIEWS LEFT  Result Date: 01/01/2020 CLINICAL DATA:  Left hip pain. EXAM: DG HIP (WITH OR WITHOUT PELVIS) 2-3V LEFT COMPARISON:  None. FINDINGS: There is no evidence of hip fracture or dislocation. There is no evidence of arthropathy or other focal bone abnormality. IMPRESSION: Negative. Electronically Signed   By: Lorriane Shire M.D.   On: 01/01/2020 12:51   ASSESSMENT AND PLAN:  64 y.o.femalewith medical historyofsarcoidosis and COPD with chronic respiratory failure on2L/min homeO2, hypertension and recently hospitalizedfrom 11/30/19 - 12/30/20withCOVID-19pneumonia andacute on chronicrespiratory failure treated with 5 days of remdesivir and Decadron. She presented to the ED on 1/8with a complaintsof increased cough, weakness and shortness of breath  Acute on chronic respiratory failure with hypoxia in setting of recent Covid-19 pneumonia, admitted 12/26 to 12/30. -Completed treatment with 5 days remdesivir and 10  days Decadron during that admisison -pt has h/o Pulmonary sarcoid along with worsening scarring/interstitial lung disease superimposed with recent COVID  infection -Per pulmonology Dr Karna Christmas there is some concern opportunistic etiology due to chronic steroid and now on Bactrim prophylaxis -change to oral taper -Started on Imuran by pulm -beta-D-glucan serology and fungal ab workup ordered -cont MetaNEB -PT/OT-rec. SNF -pt was requiring 10-15 Liters HFNC --now slowly weaned down to 5 L [place] sats >88%-- however back on NRB today  Bilateral lobar pneumonia -In setting of recent COVID-19 pneumonia.  -Completed course of Zithromaxand Rocephin.  Asthma and COPD overlap syndrome -Continue on Solu-Medrol 40 mg twice daily--change to oral taper DuoNeb -Combivent MDI and Breo Ellipta -Incentive spirometer/flutter valve  Sarcoidosis -Noted to have progressed since 2011 on CT scan obtained on admission. -Appears quite extensive and severe on personal review of CT scan. --Continue steroids as above --F/u with pulmonary rec. As above  Type 2 diabetes Hyperglycemia - secondary to steroids A1c 8.0%. Not on medications for diabetes as outpatient. --continueLantus 12units at bedtime --NovoLog19units with TID with meals  --ssi --Carb modified diet  Left hip apin radiating to the groin--?sciatica  likely from immobility this admission --heating pad --Norco PRN --Robaxin PRN -left hip xray--no acute abnormality -steroids should help  Essential hypertension chronic, stable -Continue Norvasc  Restless leg syndrome chronic, stable -Continue Requip  Generalized weakness secondary to recent acute illnesses, generalized deconditioning and underlying comorbidities. PT/OT rec. snf   DVT prophylaxis:Lovenox Code Status: Full Code Family Communication:updated dter on the phone [name] Disposition Plan: SNF Barriers to Discharge: worsening respiratory status requiring NRB  TOTAL TIME TAKING CARE OF THIS PATIENT: *25* minutes.  >50% time spent on counselling and coordination of care  Note: This dictation was  prepared with Dragon dictation along with smaller phrase technology. Any transcriptional errors that result from this process are unintentional.  Enedina Finner M.D    [facility]   CC: Primary care physician; Carlean Jews, NPPatient ID: Marissa Luna, female   DOB: 20-Jan-1956, 64 y.o.   MRN: [id number]

## 2020-01-02 NOTE — TOC Progression Note (Signed)
Transition of Care [facility]) - Progression Note    Patient Details  Name: Marissa Luna MRN: [id number] Date of Birth: 1956/05/03  [facility]) CM/SW Contact  Marissa Fitch, RN Phone Number: 01/02/2020, 3:27 PM  Clinical Narrative:     Patient on HFNC. Not discharging today Kerri at [facility] notified   Expected Discharge Plan: Home w Home Health Services    Expected Discharge Plan and Services Expected Discharge Plan: Home w Home Health Services   Discharge Planning Services: CM Consult Post Acute Care Choice: Home Health, Durable Medical Equipment Living arrangements for the past 2 months: Single Family Home Expected Discharge Date: 12/19/19               DME Arranged: Dan Humphreys rolling DME Agency: [employer]       HH Arranged: RN, PT HH Agency: [facility] Date HH Agency Contacted: 12/17/19   Representative spoke with at [facility] Agency: Barbara Cower   Social Determinants of Health (SDOH) Interventions    Readmission Risk Interventions No flowsheet data found.

## 2020-01-02 NOTE — Progress Notes (Signed)
OT Cancellation Note  Patient Details Name: Marissa Luna MRN: [id number] DOB: [birth year]   Cancelled Treatment:    Reason Eval/Treat Not Completed: Medical issues which prohibited therapy. Per PT/RN, will hold OT at this time d/t elevated HR and respiratory concerns. Pt now requiring 15L O2 via non-rebreather mask.  Will re-attempt OT treatment session at a later date/time as medically appropriate.  Richrd Prime, MPH, MS, OTR/L ascom [phone] 01/02/20, 1:05 PM

## 2020-01-02 NOTE — Progress Notes (Signed)
Patient currently asleep. Patient still on NRB at 15L, SpO2 96 and RR 20. BP 90/60 but HR is sustaining in near 120's. MD aware. Amlodipine held per MD. Continue to monitor patient's VS and alert MD if patient requires more intervention.   Madie Reno, RN

## 2020-01-02 NOTE — Progress Notes (Addendum)
Inpatient Diabetes Program Recommendations  AACE/ADA: New Consensus Statement on Inpatient Glycemic Control (2015)  Target Ranges:  Prepandial:   less than 140 mg/dL      Peak postprandial:   less than 180 mg/dL (1-2 hours)      Critically ill patients:  140 - 180 mg/dL   Lab Results  Component Value Date   GLUCAP 51 (L) 01/02/2020   HGBA1C 8.0 (H) 12/14/2019    Review of Glycemic Control Results for LAYKEN, BEG (MRN [id number]) as of 01/02/2020 14:06  Ref. Range 01/01/2020 16:37 01/01/2020 21:08 01/02/2020 08:17 01/02/2020 13:16 01/02/2020 13:19  Glucose-Capillary Latest Ref Range: 70 - 99 mg/dL 83 343 (H) 735 (H) 46 (L) 51 (L)   Inpatient Diabetes Program Recommendations:  Noted postprandial CBGs lower post lunch yesterday and hypoglycemic today. Steroids are decreased to Prednisone 40 mg qd starting today. Please consider: -Decrease in Novolog meal coverage tid to 12 units tid if eats 50%  Thank you, Billy Fischer. Dashawn Bartnick, RN, MSN, CDE  Diabetes Coordinator Inpatient Glycemic Control Team Team Pager [phone] (8am-5pm) 01/02/2020 2:07 PM

## 2020-01-03 DIAGNOSIS — Z7189 Other specified counseling: Secondary | ICD-10-CM

## 2020-01-03 DIAGNOSIS — R0602 Shortness of breath: Secondary | ICD-10-CM

## 2020-01-03 DIAGNOSIS — Z515 Encounter for palliative care: Secondary | ICD-10-CM

## 2020-01-03 LAB — GLUCOSE, CAPILLARY
Glucose-Capillary: 213 mg/dL — ABNORMAL HIGH (ref 70–99)
Glucose-Capillary: 289 mg/dL — ABNORMAL HIGH (ref 70–99)
Glucose-Capillary: 158 mg/dL — ABNORMAL HIGH (ref 70–99)
Glucose-Capillary: 97 mg/dL (ref 70–99)

## 2020-01-03 MED ORDER — LACTULOSE 10 GM/15ML PO SOLN
20.0000 g | Freq: Every day | ORAL | Status: DC
  Administered 2020-01-03 – 2020-01-05 (×3): 20 g via ORAL
  Filled 2020-01-03 (×3): qty 30

## 2020-01-03 MED ORDER — MORPHINE SULFATE (PF) 2 MG/ML IV SOLN
0.5000 mg | Freq: Three times a day (TID) | INTRAVENOUS | Status: DC | PRN
  Administered 2020-01-04: 0.5 mg via INTRAVENOUS
  Filled 2020-01-03: qty 1

## 2020-01-03 MED ORDER — METHYLPREDNISOLONE SODIUM SUCC 125 MG IJ SOLR
60.0000 mg | Freq: Two times a day (BID) | INTRAMUSCULAR | Status: DC
  Administered 2020-01-03 – 2020-01-06 (×6): 60 mg via INTRAVENOUS
  Filled 2020-01-03 (×6): qty 2

## 2020-01-03 MED ORDER — FUROSEMIDE 10 MG/ML IJ SOLN
60.0000 mg | Freq: Once | INTRAMUSCULAR | Status: AC
  Administered 2020-01-03: 60 mg via INTRAVENOUS
  Filled 2020-01-03: qty 8

## 2020-01-03 NOTE — TOC Progression Note (Signed)
Transition of Care [facility]) - Progression Note    Patient Details  Name: Marissa Luna MRN: [id number] Date of Birth: 07-30-1956  Transition of Care [facility]) CM/SW Contact  Chapman Fitch, RN Phone Number: 01/03/2020, 2:58 PM  Clinical Narrative:    Patient on non rebreather. Kerri at [facility] notifed that patient not ready for discharge  Palliative consult pending    Expected Discharge Plan: Skilled Nursing Facility    Expected Discharge Plan and Services Expected Discharge Plan: Skilled Nursing Facility   Discharge Planning Services: CM Consult Post Acute Care Choice: Home Health, Durable Medical Equipment Living arrangements for the past 2 months: Single Family Home Expected Discharge Date: 12/19/19               DME Arranged: Dan Humphreys rolling DME Agency: [employer]       HH Arranged: RN, PT HH Agency: [facility] Date HH Agency Contacted: 12/17/19   Representative spoke with at [facility] Agency: Barbara Cower   Social Determinants of Health (SDOH) Interventions    Readmission Risk Interventions No flowsheet data found.

## 2020-01-03 NOTE — Progress Notes (Signed)
Called to patient room via RN.  Patiet having increased work of breathing. Increased HFNC to 15lpm, oxygen saturations 86%.  Patient given her respimat inhaler.Patient continues to have increased work of breathing patient placed on 100% NRM and saturations are 91%.  RN is contacting patient MD.

## 2020-01-03 NOTE — Progress Notes (Signed)
Patient ID: Marissa Luna, female   DOB: May 12, 1956, 64 y.o.   MRN: [id number] Met with pt's dter Paulita Cradle and husband in the room. Explained in detail pt's overall condition with decline in respiratory status in the last 2 days  Requiring HFNC and NRB. Thereafter family met with palliative care. GOC was discussed Pt is DNR/DNI, ok to try BIPAP if needed, if however declines further then comfort care and Hospice. Will give low dose morphine for dyspnea prn.  Time 15 mins

## 2020-01-03 NOTE — Progress Notes (Signed)
OT Cancellation Note  Patient Details Name: Marissa Luna MRN: [id number] DOB: August 07, 1956   Cancelled Treatment:    Reason Eval/Treat Not Completed: Other (comment). Spoke with RN. Family in with pt and preparing for family meeting with palliative to discuss plan of care. Pt requiring increased O2 this date, 15L on HFNC. Will hold OT treatment at this time and will re-attempt at later date/time pending pt medically appropriate and pending plan of care.   Richrd Prime, MPH, MS, OTR/L ascom [phone] 01/03/20, 4:31 PM

## 2020-01-03 NOTE — Progress Notes (Signed)
Inpatient Diabetes Program Recommendations  AACE/ADA: New Consensus Statement on Inpatient Glycemic Control (2015)  Target Ranges:  Prepandial:   less than 140 mg/dL      Peak postprandial:   less than 180 mg/dL (1-2 hours)      Critically ill patients:  140 - 180 mg/dL   Results for Marissa Luna, Marissa Luna (MRN [id number]) as of 01/03/2020 14:08  Ref. Range 01/02/2020 08:17 01/02/2020 13:16 01/02/2020 13:19 01/02/2020 14:13 01/02/2020 16:27 01/02/2020 21:05 01/02/2020 23:10  Glucose-Capillary Latest Ref Range: 70 - 99 mg/dL 638 (H)  24 units NOVOLOG 46 (L) 51 (L) 165 (H)  21 units NOVOLOG  307 (H) 286 (H)  2 units NOVOLOG +  12 units LANTUS  158 (H)   Results for Marissa Luna, Marissa Luna (MRN [id number]) as of 01/03/2020 14:08  Ref. Range 01/03/2020 08:41 01/03/2020 11:43  Glucose-Capillary Latest Ref Range: 70 - 99 mg/dL 188 (H)  24 units NOVOLOG  289 (H)     Current Orders: Lantus 12 units QHS      Novolog Sensitive Correction Scale/ SSI (0-9 units) TID AC + HS      Novolog 12 units TID with meals      Getting Prednisone 40 mg Daily.  HYPO (CBG 46) yest at 1pm after getting total of 24 units Novolog at 12pm.   Novolog Meal Coverage dose decreased to 12 units TID (was 19 units TID).  Now with CBGs >200 today.    MD- Please consider the following in-hospital insulin adjustments:  1. Increase Lantus to 15 units QHS (AM CBGs have been elevated the last several days)  2. Increase Novolog SSI slightly to 14 units TID with meals    --Will follow patient during hospitalization--  Ambrose Finland RN, MSN, CDE Diabetes Coordinator Inpatient Glycemic Control Team Team Pager: [phone] (8a-5p)

## 2020-01-03 NOTE — Progress Notes (Signed)
Patient desaturation in 83-87% on 10 HFNC.Marland Kitchen Respiratory consulted and changed to nonrebreather with effective result. Patient states easier to breath with less work using nonrebreather.  MD notified and lasix ordered as well.

## 2020-01-03 NOTE — Progress Notes (Signed)
Pulmonary Medicine          Date: 01/03/2020,   MRN# [id number] Marissa MiyamotoKatie S Luna 11/18/1956     AdmissionWeight: 71.2 kg                 CurrentWeight: 70.6 kg      CHIEF COMPLAINT:   Increased oxygen requirement status post COVID-19 pneumonia   SUBJECTIVE   Patient continues to require increased O2 now up to 15L/min .  We have decreased steroids over past 3 days and her clinical worsening may be related to tapering of steroids, will go back up today.  Continue MetaNeb - patient feels its helping   PAST MEDICAL HISTORY   Past Medical History:  Diagnosis Date  . Asthma   . Environmental allergies   . Hypertension   . Sarcoidosis      SURGICAL HISTORY   Past Surgical History:  Procedure Laterality Date  . CHOLECYSTECTOMY    . COLONOSCOPY N/A 10/26/2015   Procedure: COLONOSCOPY;  Surgeon: Wallace CullensPaul Y Oh, MD;  [facility];  Service: Gastroenterology;  Laterality: N/A;     FAMILY HISTORY   Family History  Problem Relation Age of Onset  . Lung cancer Mother   . Colon cancer Father   . Diabetes Sister   . Breast cancer Sister      SOCIAL HISTORY   Social History   Tobacco Use  . Smoking status: Never Smoker  . Smokeless tobacco: Never Used  Substance Use Topics  . Alcohol use: No  . Drug use: No     MEDICATIONS    Home Medication:  Current Outpatient Rx  . Order #: [id number] Class: No Print  . Order #: [id number] Class: No Print  . Order #: [id number] Class: No Print  . Order #: [id number] Class: No Print  . Order #: [id number] Class: No Print  . Order #: [id number] Class: No Print  . Order #: [id number] Class: No Print  . Order #: [id number] Class: No Print  . Order #: [id number] Class: No Print  . Order #: [id number] Class: No Print  . Order #: [id number] Class: No Print  . Order #: [id number] Class: No Print  . Order #: [id number] Class: No Print  . Order #: [id number] Class: No Print  . Order #: [id number] Class: No Print  . Order #:  [id number] Class: No Print    Current Medication:  Current Facility-Administered Medications:  .  0.9 %  sodium chloride infusion, , Intravenous, PRN, Alford HighlandWieting, Richard, MD, Stopped at 12/30/19 1728 .  acetaminophen (TYLENOL) tablet 650 mg, 650 mg, Oral, Q4H PRN, Manuela SchwartzMorrison, Brenda, NP, 650 mg at 01/01/20 1411 .  amLODipine (NORVASC) tablet 5 mg, 5 mg, Oral, Daily, Lesette Frary, MD, 5 mg at 01/03/20 0901 .  ascorbic acid (VITAMIN C) tablet 500 mg, 500 mg, Oral, Daily, Renae GlossWieting, Richard, MD, 500 mg at 01/03/20 0859 .  calcium-vitamin D (OSCAL WITH D) 500-200 MG-UNIT per tablet 1 tablet, 1 tablet, Oral, Daily, Renae GlossWieting, Richard, MD, 1 tablet at 01/03/20 0900 .  enoxaparin (LOVENOX) injection 40 mg, 40 mg, Subcutaneous, Q24H, Wieting, Richard, MD, 40 mg at 01/02/20 1821 .  famotidine (PEPCID) tablet 20 mg, 20 mg, Oral, Daily, Amery, Sahar, MD, 20 mg at 01/03/20 0900 .  feeding supplement (ENSURE ENLIVE) (ENSURE ENLIVE) liquid 237 mL, 237 mL, Oral, BID BM, Alford HighlandWieting, Richard, MD, 237 mL at 01/03/20 0909 .  fluticasone (FLONASE) 50 MCG/ACT nasal spray 2 spray, 2 spray, Each Nare, Daily, Alford HighlandWieting, Richard, MD, 2 spray at 01/03/20 0910 .  fluticasone furoate-vilanterol (BREO ELLIPTA)  100-25 MCG/INH 1 puff, 1 puff, Inhalation, Daily, 1 puff at 01/03/20 0911 **AND** [DISCONTINUED] umeclidinium bromide (INCRUSE ELLIPTA) 62.5 MCG/INH 1 puff, 1 puff, Inhalation, Daily, Alford Highland, MD, 1 puff at 12/24/19 0958 .  folic acid (FOLVITE) tablet 1 mg, 1 mg, Oral, Daily, Renae Gloss, Richard, MD, 1 mg at 01/03/20 0858 .  guaiFENesin-dextromethorphan (ROBITUSSIN DM) 100-10 MG/5ML syrup 10 mL, 10 mL, Oral, Q4H PRN, Alford Highland, MD, 10 mL at 01/02/20 0320 .  HYDROcodone-acetaminophen (NORCO/VICODIN) 5-325 MG per tablet 1-2 tablet, 1-2 tablet, Oral, Q6H PRN, Pennie Banter, DO, 1 tablet at 01/02/20 2320 .  insulin aspart (novoLOG) injection 0-5 Units, 0-5 Units, Subcutaneous, QHS, Alford Highland, MD, 2 Units at  01/01/20 2221 .  insulin aspart (novoLOG) injection 0-9 Units, 0-9 Units, Subcutaneous, TID WC, Alford Highland, MD, 3 Units at 01/03/20 0911 .  insulin aspart (novoLOG) injection 12 Units, 12 Units, Subcutaneous, TID WC, Enedina Finner, MD, 12 Units at 01/03/20 1234 .  insulin glargine (LANTUS) injection 12 Units, 12 Units, Subcutaneous, QHS, Esaw Grandchild A, DO, 12 Units at 01/02/20 2300 .  Ipratropium-Albuterol (COMBIVENT) respimat 1 puff, 1 puff, Inhalation, Q6H, Alford Highland, MD, 1 puff at 01/03/20 [id number] .  ipratropium-albuterol (DUONEB) 0.5-2.5 (3) MG/3ML nebulizer solution 3 mL, 3 mL, Nebulization, Q4H PRN, Georgeann Oppenheim, Sudheer B, MD, 3 mL at 01/03/20 0718 .  loratadine (CLARITIN) tablet 10 mg, 10 mg, Oral, Daily, Wieting, Richard, MD, 10 mg at 01/03/20 0900 .  methocarbamol (ROBAXIN) tablet 1,000 mg, 1,000 mg, Oral, Q8H PRN, Esaw Grandchild A, DO, 1,000 mg at 01/02/20 0859 .  methylPREDNISolone sodium succinate (SOLU-MEDROL) 125 mg/2 mL injection 60 mg, 60 mg, Intravenous, Q12H, Ajane Novella, MD .  multivitamin with minerals tablet 1 tablet, 1 tablet, Oral, Daily, Renae Gloss, Richard, MD, 1 tablet at 01/03/20 0900 .  rOPINIRole (REQUIP) tablet 0.25 mg, 0.25 mg, Oral, QHS, Wieting, Richard, MD, 0.25 mg at 01/02/20 2300 .  sodium chloride (OCEAN) 0.65 % nasal spray 1 spray, 1 spray, Each Nare, PRN, Alford Highland, MD, 1 spray at 01/03/20 1038 .  sulfamethoxazole-trimethoprim (BACTRIM) 400-80 MG per tablet 1 tablet, 1 tablet, Oral, Q12H, Vida Rigger, MD, 1 tablet at 01/03/20 0901    ALLERGIES   Patient has no known allergies.     REVIEW OF SYSTEMS    Review of Systems:  Gen:  Denies  fever, sweats, chills weigh loss  HEENT: Denies blurred vision, double vision, ear pain, eye pain, hearing loss, nose bleeds, sore throat Cardiac:  No dizziness, chest pain or heaviness, chest tightness,edema Resp:   +cough or sputum porduction, shortness of breath, Gi: Denies swallowing  difficulty, stomach pain, nausea or vomiting, diarrhea, constipation, bowel incontinence Gu:  Denies bladder incontinence, burning urine Ext:   Denies Joint pain, stiffness or swelling Skin: Denies  skin rash, easy bruising or bleeding or hives Endoc:  Denies polyuria, polydipsia , polyphagia or weight change Psych:   Denies depression, insomnia or hallucinations   Other:  All other systems negative   VS: BP (!) 115/50 (BP Location: Right Arm)   Pulse (!) 118   Temp 99 F (37.2 C) (Axillary)   Resp 16   Ht 4\' 11"  (1.499 m)   Wt 70.6 kg   SpO2 91%   BMI 31.45 kg/m      PHYSICAL EXAM    GENERAL:NAD, no fevers, chills, no weakness no fatigue HEAD: Normocephalic, atraumatic.  EYES: Pupils equal, round, reactive to light. Extraocular muscles intact. No scleral icterus.  MOUTH: Moist mucosal membrane.  Dentition intact. No abscess noted.  EAR, NOSE, THROAT: Clear without exudates. No external lesions.  NECK: Supple. No thyromegaly. No nodules. No JVD.  PULMONARY: mildly rhonchorous breath sounds with decraesed air entry b/l CARDIOVASCULAR: S1 and S2. Regular rate and rhythm. No murmurs, rubs, or gallops. No edema. Pedal pulses 2+ bilaterally.  GASTROINTESTINAL: Soft, nontender, nondistended. No masses. Positive bowel sounds. No hepatosplenomegaly.  MUSCULOSKELETAL: No swelling, clubbing, or edema. Range of motion full in all extremities.  NEUROLOGIC: Cranial nerves II through XII are intact. No gross focal neurological deficits. Sensation intact. Reflexes intact.  SKIN: No ulceration, lesions, rashes, or cyanosis. Skin warm and dry. Turgor intact.  PSYCHIATRIC: Mood, affect within normal limits. The patient is awake, alert and oriented x 3. Insight, judgment intact.       IMAGING    CT Angio Chest PE W and/or Wo Contrast  Result Date: 12/14/2019 CLINICAL DATA:  Shortness of breath EXAM: CT ANGIOGRAPHY CHEST WITH CONTRAST TECHNIQUE: Multidetector CT imaging of the chest was  performed using the standard protocol during bolus administration of intravenous contrast. Multiplanar CT image reconstructions and MIPs were obtained to evaluate the vascular anatomy. CONTRAST:  154mL OMNIPAQUE IOHEXOL 350 MG/ML SOLN COMPARISON:  Apr 22, 2010 FINDINGS: Cardiovascular: There is a optimal opacification of the pulmonary arteries. There is no central,segmental, or subsegmental filling defects within the pulmonary arteries. There is mild cardiomegaly. No evidence of right ventricular heart strain. There is normal three-vessel brachiocephalic anatomy without proximal stenosis. The thoracic aorta is normal in appearance. Mediastinum/Nodes: Again noted are extensively calcified mediastinal and bilateral hilar lymph nodes. Thyroid gland, trachea, and esophagus demonstrate no significant findings. Lungs/Pleura: There is extensive bilateral bronchial wall thickening and bronchiectasis with honeycombing most notable in the posterior lower lungs with subpleural bleb formation. There has been interval progression since the prior exam dating back to 20/11. There is new hazy/ground-glass opacity seen within the periphery of the bilateral lungs, left slightly greater than right. No pleural effusion is seen. Upper Abdomen: No acute abnormalities present in the visualized portions of the upper abdomen. Musculoskeletal: No chest wall abnormality. No acute or significant osseous findings. Review of the MIP images confirms the above findings. IMPRESSION: 1. No central, segmental, or subsegmental pulmonary embolism. 2. Extensive bilateral hilar and mediastinal calcified adenopathy with extensive posterior lower lung bronchiectasis, honeycombing, and subpleural bleb formation. There has been interval progression in the lung changes since the prior exam, consistent with progression of the patient's known sarcoidosis. 3. Hazy/ground-glass opacities seen within both lungs, left greater than right, consistent with COVID  pneumonia. 4. Mild cardiomegaly Electronically Signed   By: Prudencio Pair M.D.   On: 12/14/2019 01:18   DG Chest Port 1 View  Result Date: 01/02/2020 CLINICAL DATA:  Increased shortness of breath, recent COVID EXAM: PORTABLE CHEST 1 VIEW COMPARISON:  12/28/2019 FINDINGS: Persistent bilateral opacities. No pleural effusion or pneumothorax. Stable cardiomediastinal contours. IMPRESSION: Bilateral pulmonary opacities remain present and appear similar to the prior study. Electronically Signed   By: Macy Mis M.D.   On: 01/02/2020 14:08   DG Chest Port 1 View  Result Date: 12/28/2019 CLINICAL DATA:  Acute on chronic respiratory failure with hypoxia secondary to COVID. COVID positive on 11/30/2019. EXAM: PORTABLE CHEST 1 VIEW COMPARISON:  Chest x-ray dated 12/25/2019. FINDINGS: Persistent opacities within the mid/upper lungs bilaterally, compatible with previously described COVID pneumonia, superimposed on extensive chronic interstitial lung disease/fibrosis. No appreciable change compared to the most recent chest x-ray of 12/10/2019. No pleural effusion or  pneumothorax is seen. Heart size and mediastinal contours are stable. No acute or suspicious osseous finding. IMPRESSION: Stable chest x-ray. Persistent opacities within the mid/upper lungs bilaterally, compatible with previously described COVID pneumonia superimposed on extensive chronic interstitial lung disease/fibrosis. Electronically Signed   By: Bary Richard M.D.   On: 12/28/2019 12:54   DG Chest Port 1 View  Result Date: 12-30-2019 CLINICAL DATA:  64 year old female with shortness of breath. EXAM: PORTABLE CHEST 1 VIEW COMPARISON:  Chest radiograph dated 11/30/2019. FINDINGS: Background of fibrotic changes primarily involving the left mid to lower lung field. Slight increased density primarily over the left lung may be chronic but appears more prominent compared to the prior radiograph and developing infiltrate is not excluded. Clinical  correlation is recommended. No pleural effusion or pneumothorax. The cardiac silhouette is within normal limits. Calcified hilar and mediastinal granuloma. No acute osseous pathology. IMPRESSION: 1. Slight increased density primarily in the left mid to lower lung field. Developing infiltrate is not excluded. Clinical correlation is recommended. 2. Calcified hilar and mediastinal granuloma. Electronically Signed   By: Elgie Collard M.D.   On: 2019-12-30 23:28   DG HIP UNILAT WITH PELVIS 2-3 VIEWS LEFT  Result Date: 01/01/2020 CLINICAL DATA:  Left hip pain. EXAM: DG HIP (WITH OR WITHOUT PELVIS) 2-3V LEFT COMPARISON:  None. FINDINGS: There is no evidence of hip fracture or dislocation. There is no evidence of arthropathy or other focal bone abnormality. IMPRESSION: Negative. Electronically Signed   By: Francene Boyers M.D.   On: 01/01/2020 12:51    Below images are independently reviewed by me.           - - CT CHEST - interval progression of sarcoidosis stage 4 with worsening pulmonary fibrosis bilaterally over 10 years             ASSESSMENT/PLAN   Acute on chronic hypoxemic respiratory failure  - patient has had improvement with recurrent decline on short interval follow up with re-admission 2 times in last 30 days -Post COVID pneumonitis and fibrosis - patient had progressively required more O2 since tapering from IV solumedrol to PO prednisone - this may be related to exacerbation of underlying sarcoidosis from COVID19 pneumonia - some concern for opportunistic etiology due to relative immunocompromised state from chronic steroids and recent multiple bursts of high dose IV and PO steroids - She has no peripheral edema and very mild bibasilar crackles pointing to less likely interstitial edema as cause-METANEB q4h - Specifically she may develop PJP and will need at minimum bactrim for PPX -  beta-D-glucan serology and fungal ab workup -in process - discussed bronchoscopy for  airway inspection and BAL to evaluate for yeastform/hyphae, microbiology cultures and staining - patient is agreeable - there is decreased air entry - recruitment maneuvers - continue MetaNEB -family meeting today for goals of care - overall patient has poor prognosis   Pulmonary Sarcoidosis Stage IV  - despite chronic gluccocorticoids patients fibrosis seems to be progressing as evidenced by serial imaging above - solumedrol incrasing today due to worsening clinical status with increased O2 req  Asthma and COPD overlap syndrome (ACOS)   - patient currently on solumedrol - 60 q 12h - will plan to wean as able  - agree with DuoNEb  - combivent MDI , breo-ellipta   -encourage use of IS and participate in PT/OT          Thank you for allowing me to participate in the care of this patient.   Patient/Family  are satisfied with care plan and all questions have been answered.  This document was prepared using Dragon voice recognition software and may include unintentional dictation errors.     Vida Rigger, M.D.  Division of Pulmonary & Critical Care Medicine  [facility]

## 2020-01-03 NOTE — Consult Note (Signed)
Consultation Note Date: 01/03/2020   Patient Name: Marissa Luna  DOB: [birth year]  MRN: [id number]  Age / Sex: 64 y.o., female  PCP: Ronnell Freshwater, NP Referring Physician: Fritzi Mandes, MD  Reason for Consultation: Establishing goals of care  HPI/Patient Profile: 64 y.o. female  with past medical history of sarcoidosis, COPD, chronic respiratory failure on 2L O2 at home, and admitted on 12/21/2019 with cough and shortness of breath. She was recently hospitalized due to COVID-19 pneumonia 12/26-12/30 - she was treated with remdesivir and decadron. Patient had some improvement during hospitalization with less oxygen requirements; however today her shortness of breath is worse and she is requiring non-rebreather mask, increasing fatigue and weakness. 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 PMT consulted to assist with [place].   Clinical Assessment and Goals of Care: I have reviewed medical records including EPIC notes, labs and imaging, received report from RN and Dr. Posey Pronto, assessed the patient and then met with patient, daughter Jordan Hawks,  husband Herbie [place], sister, and niece to discuss diagnosis prognosis, GOC, EOL wishes, disposition and options.  I introduced Palliative Medicine as specialized medical care for people living with serious illness. It focuses on providing relief from the symptoms and stress of a serious illness. The goal is to improve quality of life for both the patient and the family.  As far as functional and nutritional status, patient tells me prior to COVID-19 infection she was doing well - ambulatory and could meet her daily care needs independently.    We discussed her current illness and what it means in the larger context of her on-going co-morbidities.  Natural disease trajectory and expectations at EOL were discussed. We thoroughly discussed that at baseline, patient has compromised lungs d/t sarcoidosis. Family  educated about sarcoidosis. Discussed COVID infection/current illness now further complicating her respiratory status.   I attempted to elicit values and goals of care important to the patient.  Patient is immediately clear that she does not want to be intubated - family initially unsure and asks many questions about intubation, eventually they agree that they support Ms. Verdone's decisions to not be intubated. I shared with them the fear that patient may not be able to be weaned off of ventilator d/t her lung disease - family expresses understanding.   The difference between aggressive medical intervention and comfort care was considered in light of the patient's goals of care. We discussed continuing current care for now - discussed if patient worsens we could try bipap. Patient is okay with a short-term trial of bipap if needed. We also discussed what focusing on Ms. Stowell's comfort would look like if she did not improve or worsened. Discussed aggressive symptom management.  Advance directives and concepts specific to code status were considered and discussed. Discussed DNR status - patient and family agree this is appropriate given Ms. Ragan's wishes to not be on a ventilator and her severe illness.   Hospice and Palliative Care services outpatient were explained and offered. Discussed idea of patient returning home with hospice is an option. Discussed aggressive symptom management. Patient and family are aware and will consider.   Patient and daughter seem to grasp severity of situation and concern about poor prognosis.   Questions and concerns were addressed. The family was encouraged to call with questions or concerns.   Primary Decision Maker PATIENT - she requests daughter and spouse join her in decision making    SUMMARY OF RECOMMENDATIONS    - code  status changed to DNI/DNR - patient okay with short trial of bipap if she worsened - family would like her to try this - however, if she  does not tolerate or continued to decline she would want to transition to full comfort care - hospice care discussed and patient/family considering - continue with current plan of care for now - PMT to follow up Monday  Code Status/Advance Care Planning:  DNR   Symptom Management:   Dr. Posey Pronto to try low dose morphine for dyspnea  Additional Recommendations (Limitations, Scope, Preferences):  NO INTUBATION, bipap okay  Psycho-social/Spiritual:   Desire for further Chaplaincy support:no  Additional Recommendations: Education on Hospice  Prognosis:   Unable to determine - poor prognosis r/t patient's lung disease  Discharge Planning: To Be Determined - home with hospice is reasonable, family considering     Primary Diagnoses: Present on Admission: . Essential hypertension . Sarcoidosis of lung ([place]) . Acute respiratory failure ([place])   I have reviewed the medical record, interviewed the patient and family, and examined the patient. The following aspects are pertinent.  Past Medical History:  Diagnosis Date  . Asthma   . Environmental allergies   . Hypertension   . Sarcoidosis    Social History   Socioeconomic History  . Marital status: Married    Spouse name: Not on file  . Number of children: Not on file  . Years of education: Not on file  . Highest education level: Not on file  Occupational History  . Not on file  Tobacco Use  . Smoking status: Never Smoker  . Smokeless tobacco: Never Used  Substance and Sexual Activity  . Alcohol use: No  . Drug use: No  . Sexual activity: Not on file  Other Topics Concern  . Not on file  Social History Narrative  . Not on file   Social Determinants of Health   Financial Resource Strain:   . Difficulty of Paying Living Expenses: Not on file  Food Insecurity:   . Worried About Charity fundraiser in the Last Year: Not on file  . Ran Out of Food in the Last Year: Not on file  Transportation Needs:   . Lack of  Transportation (Medical): Not on file  . Lack of Transportation (Non-Medical): Not on file  Physical Activity:   . Days of Exercise per Week: Not on file  . Minutes of Exercise per Session: Not on file  Stress:   . Feeling of Stress : Not on file  Social Connections:   . Frequency of Communication with Friends and Family: Not on file  . Frequency of Social Gatherings with Friends and Family: Not on file  . Attends Religious Services: Not on file  . Active Member of Clubs or Organizations: Not on file  . Attends Archivist Meetings: Not on file  . Marital Status: Not on file   Family History  Problem Relation Age of Onset  . Lung cancer Mother   . Colon cancer Father   . Diabetes Sister   . Breast cancer Sister    Scheduled Meds: . amLODipine  5 mg Oral Daily  . ascorbic acid  500 mg Oral Daily  . calcium-vitamin D  1 tablet Oral Daily  . enoxaparin (LOVENOX) injection  40 mg Subcutaneous Q24H  . famotidine  20 mg Oral Daily  . feeding supplement (ENSURE ENLIVE)  237 mL Oral BID BM  . fluticasone  2 spray Each Nare Daily  . fluticasone  furoate-vilanterol  1 puff Inhalation Daily  . folic acid  1 mg Oral Daily  . insulin aspart  0-5 Units Subcutaneous QHS  . insulin aspart  0-9 Units Subcutaneous TID WC  . insulin aspart  12 Units Subcutaneous TID WC  . insulin glargine  12 Units Subcutaneous QHS  . Ipratropium-Albuterol  1 puff Inhalation Q6H  . loratadine  10 mg Oral Daily  . methylPREDNISolone (SOLU-MEDROL) injection  60 mg Intravenous Q12H  . multivitamin with minerals  1 tablet Oral Daily  . rOPINIRole  0.25 mg Oral QHS  . sulfamethoxazole-trimethoprim  1 tablet Oral Q12H   Continuous Infusions: . sodium chloride Stopped (12/30/19 1728)   PRN Meds:.sodium chloride, acetaminophen, guaiFENesin-dextromethorphan, HYDROcodone-acetaminophen, ipratropium-albuterol, methocarbamol, sodium chloride No Known Allergies Review of Systems  Constitutional: Positive for  activity change and fatigue.  Respiratory: Positive for shortness of breath.   Neurological: Positive for weakness.    Physical Exam Constitutional:      Appearance: She is ill-appearing.  HENT:     Head: Normocephalic and atraumatic.  Cardiovascular:     Rate and Rhythm: Tachycardia present.  Pulmonary:     Effort: Tachypnea and accessory muscle usage present.  Abdominal:     Palpations: Abdomen is soft.  Musculoskeletal:     Right lower leg: No edema.     Left lower leg: No edema.  Skin:    General: Skin is warm and dry.  Neurological:     Mental Status: She is alert and oriented to person, place, and time.  Psychiatric:        Attention and Perception: Attention normal.        Behavior: Behavior normal.        Cognition and Memory: Cognition normal.     Vital Signs: BP (!) 115/50 (BP Location: Right Arm)   Pulse (!) 118   Temp 99 F (37.2 C) (Axillary)   Resp 16   Ht 4' 11"  (1.499 m)   Wt 70.6 kg   SpO2 91%   BMI 31.45 kg/m  Pain Scale: 0-10 POSS *See Group Information*: S-Acceptable,Sleep, easy to arouse Pain Score: 0-No pain   SpO2: SpO2: 91 % O2 Device:SpO2: 91 % O2 Flow Rate: .O2 Flow Rate (L/min): 15 L/min  IO: Intake/output summary:   Intake/Output Summary (Last 24 hours) at 01/03/2020 1252 Last data filed at 01/03/2020 1045 Gross per 24 hour  Intake 0 ml  Output 950 ml  Net -950 ml    LBM: Last BM Date: 01/02/20 Baseline Weight: Weight: 71.2 kg Most recent weight: Weight: 70.6 kg     Palliative Assessment/Data: PPS 40%    Time Total: 70 minutes Greater than 50%  of this time was spent counseling and coordinating care related to the above assessment and plan.  Juel Burrow, DNP, AGNP-C Palliative Medicine Team [phone] Pager: [phone]

## 2020-01-03 NOTE — Progress Notes (Signed)
Physical Therapy Treatment Patient Details Name: Marissa Luna MRN: [id number] DOB: January 29, 1956 Today's Date: 01/03/2020    History of Present Illness 64 y/o female admitted for acute on chronic respiratory failure with hypoxia secondary to Covid. She complains of cough, weakness, and SOB symptoms. History includes sarcodosis, COPD, CRD on 2L of O2 chronic, HTN and recent hospital stay for covid 12/26- 12/30, now off airborne isolation..    PT Comments    Spoke with nursing before seeing pt and confirmed that family did have palliative meeting, but that pt is off the non-rebreather and has been doing better this afternoon than this AM.   She was willing to participate with a few light in-bed exercises but was not keen on overdoing it.  She was able to do a few gentle LE exercises in bed with frequent rest breaks, reminders to breath deeply and through nose.  She was pleasant and eager to go what she could but ultimately remained hesitant to do a whole lot.  Pt on 15 L with sats 85-90% t/o the session, HR steady but elevated generally in the 110s.    Follow Up Recommendations  SNF;Supervision/Assistance - 24 hour     Equipment Recommendations  None recommended by PT    Recommendations for Other Services       Precautions / Restrictions Precautions Precautions: Fall Restrictions Weight Bearing Restrictions: No    Mobility  Bed Mobility               General bed mobility comments: deferred as pt has been needing more O2 and has desated very quickly with recent mobility tasks  Transfers                    Ambulation/Gait                 Stairs             Wheelchair Mobility    Modified Rankin (Stroke Patients Only)       Balance                                            Cognition Arousal/Alertness: Awake/alert Behavior During Therapy: WFL for tasks assessed/performed Overall Cognitive Status: Within Functional Limits for  tasks assessed                                        Exercises General Exercises - Lower Extremity Ankle Circles/Pumps: AROM;10 reps;Both Heel Slides: Strengthening;10 reps;Both(with lightly resisted leg extensions) Hip ABduction/ADduction: Strengthening;10 reps;Both(lightly resisted, rest breaks after 5 reps each side)    General Comments        Pertinent Vitals/Pain Pain Assessment: 0-10 Pain Score: 4  Pain Location: L hip/groin    Home Living                      Prior Function            PT Goals (current goals can now be found in the care plan section) Progress towards PT goals: Not progressing toward goals - comment(Pt's O2 requirements have gone up, HR remains high at rest)    Frequency    Min 2X/week      PT Plan Current plan remains appropriate    Co-evaluation  AM-PAC PT "6 Clicks" Mobility   Outcome Measure  Help needed turning from your back to your side while in a flat bed without using bedrails?: A Little Help needed moving from lying on your back to sitting on the side of a flat bed without using bedrails?: A Little Help needed moving to and from a bed to a chair (including a wheelchair)?: A Lot Help needed standing up from a chair using your arms (e.g., wheelchair or bedside chair)?: A Lot Help needed to walk in hospital room?: A Lot Help needed climbing 3-5 steps with a railing? : A Lot 6 Click Score: 14    End of Session Equipment Utilized During Treatment: Oxygen(15L, pt generally staying 85-90% saturations) Activity Tolerance: Patient limited by fatigue Patient left: with bed alarm set;with call bell/phone within reach;with family/visitor present   PT Visit Diagnosis: Muscle weakness (generalized) (M62.81);Difficulty in walking, not elsewhere classified (R26.2)     Time: 1645-1700 PT Time Calculation (min) (ACUTE ONLY): 15 min  Charges:  $Therapeutic Exercise: 8-22 mins                      Kreg Shropshire, DPT 01/03/2020, 5:08 PM

## 2020-01-03 NOTE — Progress Notes (Signed)
[place] at [place] NAME: Marissa Luna    MR#:  [id number]  DATE OF BIRTH:  June 04, 1956  SUBJECTIVE:  patient continues to have increased work of breathing for last two days. Now placed back again on not rebreather. Initially was on 10 L a flow nasal cannula oxygen. She seems to tire out, unable to eat because of shortness of breath and looks fatigued. Denies chest pain. Has some cough. Tachycardic REVIEW OF SYSTEMS:   Review of Systems  Constitutional: Positive for malaise/fatigue. Negative for chills, fever and weight loss.  HENT: Negative for ear discharge, ear pain and nosebleeds.   Eyes: Negative for blurred vision, pain and discharge.  Respiratory: Positive for cough and shortness of breath. Negative for sputum production, wheezing and stridor.   Cardiovascular: Negative for chest pain, palpitations, orthopnea and PND.  Gastrointestinal: Negative for abdominal pain, diarrhea, nausea and vomiting.  Genitourinary: Negative for frequency and urgency.  Musculoskeletal: Positive for joint pain. Negative for back pain.  Neurological: Positive for weakness. Negative for sensory change, speech change and focal weakness.  Psychiatric/Behavioral: Negative for depression and hallucinations. The patient is not nervous/anxious.    Tolerating Diet: very little   DRUG ALLERGIES:  No Known Allergies  VITALS:  Blood pressure (!) 115/50, pulse (!) 118, temperature 99 F (37.2 C), temperature source Axillary, resp. rate 16, height 4\' 11"  (1.499 m), weight 70.6 kg, SpO2 91 %.  PHYSICAL EXAMINATION:   Physical Exam  GENERAL:  64 y.o.-year-old patient lying in the bed with mild -moderate acute distress. Due to worsening shortness of breath EYES: Pupils equal, round, reactive to light and accommodation. No scleral icterus.   HEENT: Head atraumatic, normocephalic. Oropharynx and nasopharynx clear.  NECK:  Supple, no jugular venous distention. No  thyroid enlargement, no tenderness.  LUNGS: coarse breath sounds bilaterally, no wheezing, fine rales bilaterally, no rhonchi. No use of accessory muscles of respiration. Increased RR CARDIOVASCULAR: S1, S2 normal. No murmurs, rubs, or gallops. Tachycardia + ABDOMEN: Soft, nontender, nondistended. Bowel sounds present. No organomegaly or mass.  EXTREMITIES: No cyanosis, clubbing or edema b/l.    NEUROLOGIC: Cranial nerves II through XII are intact. No focal Motor or sensory deficits b/l.   PSYCHIATRIC:  patient is alert and oriented x 3. Very deconditioned SKIN: No obvious rash, lesion, or ulcer.   LABORATORY PANEL:  CBC Recent Labs  Lab 12/28/19 0509  WBC 12.1*  HGB 11.0*  HCT 35.1*  PLT 199    Chemistries  Recent Labs  Lab 12/28/19 0509  NA 146*  K 4.3  CL 105  CO2 31  GLUCOSE 96  BUN 33*  CREATININE 0.73  0.67  CALCIUM 8.5*   Cardiac Enzymes No results for input(s): TROPONINI in the last 168 hours. RADIOLOGY:  DG Chest Port 1 View  Result Date: 01/02/2020 CLINICAL DATA:  Increased shortness of breath, recent COVID EXAM: PORTABLE CHEST 1 VIEW COMPARISON:  12/28/2019 FINDINGS: Persistent bilateral opacities. No pleural effusion or pneumothorax. Stable cardiomediastinal contours. IMPRESSION: Bilateral pulmonary opacities remain present and appear similar to the prior study. Electronically Signed   By: Macy Mis M.D.   On: 01/02/2020 14:08   ASSESSMENT AND PLAN:  64 y.o.femalewith medical historyofsarcoidosis and COPD with chronic respiratory failure on2L/min homeO2, hypertension and recently hospitalizedfrom 11/30/19 - 12/30/20withCOVID-19pneumonia andacute on chronicrespiratory failure treated with 5 days of remdesivir and Decadron. She presented to the ED on 1/8with a complaintsof increased cough, weakness and shortness of breath  Acute  on chronic respiratory failure with hypoxia in setting of recent Covid-19 pneumonia, admitted 12/26 to 12/30,  extensive Sarcoidosis and bronchiectasis -Completed treatment with 5 days remdesivir and 10 days Decadron during that admisison -pt has h/o Extensive  Pulmonary sarcoid along with worsening alveolitis/interstitial lung disease superimposed with recent COVID infection -Per pulmonology Dr Karna Christmas on Bactrim prophylaxis to cover opportunistic infection -change to oral taper -Started on Imuran by pulm -beta-D-glucan serology and fungal ab workup ordered -cont MetaNEB -PT/OT-rec. SNF -pt was requiring 10-15 Liters HFNC --now slowly weaned down to 5 L [place] sats >88%-- however back on NRB yday weaned to 10 L HFNC-- now back on NRB today when sats dropped to 86% with SOB -D/w pt regarding her lung condition with worsening clinically. She expressed to me she would not want to go on a ventilator if required however wants her dter and husband to be part of the decision also. -Palliative care consulted--Shae NP has arranged family meeting at 1:15 pm  Bilateral lobar pneumonia -In setting of recent COVID-19 pneumonia.  -Completed course of Zithromaxand Rocephin.  Asthma and COPD overlap syndrome -was on Solu-Medrol 40 mg twice daily--change to oral taper DuoNeb -Combivent MDI and Breo Ellipta -Incentive spirometer/flutter valve  Sarcoidosis -Noted to have progressed since 2011 on CT scan obtained on admission jan 9th, 2021 -Appears quite extensive and severe on personal review of CT scan. --Continue steroids as above --F/u with pulmonary rec. As above  Type 2 diabetes Hyperglycemia - secondary to steroids A1c 8.0%. Not on medications for diabetes as outpatient. --continueLantus 12units at bedtime --NovoLog19units with TID with meals  --ssi --Carb modified diet  Left hip apin radiating to the groin--?sciatica  likely from immobility this admission --heating pad --Norco PRN --Robaxin PRN -left hip xray--no acute abnormality -steroids should help  Essential  hypertension chronic, stable -Continue Norvasc  Restless leg syndrome chronic, stable -Continue Requip  Generalized weakness secondary to recent acute illnesses, generalized deconditioning and underlying comorbidities. PT/OT rec. snf  Overall decompensated in the last 2 days from respiratory standpoint.  DVT prophylaxis:Lovenox Code Status: Full Code Family Communication:to meet with family at 1:15 pm Disposition Plan: SNF Barriers to Discharge: worsening respiratory status requiring NRB  TOTAL TIME TAKING CARE OF THIS PATIENT: *25* minutes.  >50% time spent on counselling and coordination of care  Note: This dictation was prepared with Dragon dictation along with smaller phrase technology. Any transcriptional errors that result from this process are unintentional.  Enedina Finner M.D    [facility]   CC: Primary care physician; Carlean Jews, NPPatient ID: Marissa Luna, female   DOB: 1956/02/02, 65 y.o.   MRN: [id number]

## 2020-01-04 LAB — CREATININE, SERUM
Creatinine, Ser: 0.98 mg/dL (ref 0.44–1.00)
GFR calc Af Amer: >60 mL/min (ref >60)
GFR calc non Af Amer: >60 mL/min (ref >60)

## 2020-01-04 LAB — GLUCOSE, CAPILLARY
Glucose-Capillary: 180 mg/dL — ABNORMAL HIGH (ref 70–99)
Glucose-Capillary: 179 mg/dL — ABNORMAL HIGH (ref 70–99)
Glucose-Capillary: 358 mg/dL — ABNORMAL HIGH (ref 70–99)
Glucose-Capillary: 155 mg/dL — ABNORMAL HIGH (ref 70–99)

## 2020-01-04 MED ORDER — MORPHINE SULFATE (PF) 2 MG/ML IV SOLN
0.5000 mg | Freq: Four times a day (QID) | INTRAVENOUS | Status: DC | PRN
  Administered 2020-01-04 – 2020-01-05 (×2): 0.5 mg via INTRAVENOUS
  Filled 2020-01-04 (×2): qty 1

## 2020-01-04 MED ORDER — FUROSEMIDE 10 MG/ML IJ SOLN
40.0000 mg | Freq: Once | INTRAMUSCULAR | Status: AC
  Administered 2020-01-04: 40 mg via INTRAVENOUS
  Filled 2020-01-04: qty 4

## 2020-01-04 NOTE — Progress Notes (Signed)
Rn alerted by telemetry of patient sats in 24s at 1330. Patient repositioned with ineffective result. Patient placed on nonrebreather mask due to mouthbreathing and low sats. Oxygenation 90-93%. Patient with increased work of breathing and tachypnea which improved with nonrebreather mask. MD notified and orders given for lasix. Will continue to monitor.

## 2020-01-04 NOTE — Progress Notes (Signed)
Called by pt RN.  Patient saturations in the low 80's on 100% NRM.  Patient placed on HFNC 15lpm with 100% NRM patient saturations 91%.  RN is contacting the MD.

## 2020-01-04 NOTE — Progress Notes (Signed)
MD notified of increased oxygenation need. No new orders given. Current oxygenation 93% on 15L HFNC with 100% nonrebreather mask.  Will continue to monitor.Patient alert and oriented and states breathing is better currently and will let nursing know if work of breathing increases.

## 2020-01-04 NOTE — Progress Notes (Signed)
[facility]  - [place] at [facility]   PATIENT NAME: Marissa Luna    MR#:  [id number]  DATE OF BIRTH:  05-16-1956  SUBJECTIVE:  patient feels better than yesterday. She is currently is on high flow nasal cannula 15 L. Feels hungry. She is sitting at the edge of the bed and eating her breakfast. Denies chest pain. No fever. REVIEW OF SYSTEMS:   Review of Systems  Constitutional: Positive for malaise/fatigue. Negative for chills, fever and weight loss.  HENT: Negative for ear discharge, ear pain and nosebleeds.   Eyes: Negative for blurred vision, pain and discharge.  Respiratory: Positive for cough and shortness of breath. Negative for sputum production, wheezing and stridor.   Cardiovascular: Negative for chest pain, palpitations, orthopnea and PND.  Gastrointestinal: Negative for abdominal pain, diarrhea, nausea and vomiting.  Genitourinary: Negative for frequency and urgency.  Musculoskeletal: Positive for joint pain. Negative for back pain.  Neurological: Positive for weakness. Negative for sensory change, speech change and focal weakness.  Psychiatric/Behavioral: Negative for depression and hallucinations. The patient is not nervous/anxious.    Tolerating Diet: yes  DRUG ALLERGIES:  No Known Allergies  VITALS:  Blood pressure 135/67, pulse (!) 106, temperature 97.7 F (36.5 C), temperature source Oral, resp. rate 20, height 4\' 11"  (1.499 m), weight 70.6 kg, SpO2 93 %.  PHYSICAL EXAMINATION:   Physical Exam  GENERAL:  64 y.o.-year-old patient lying in the bed with mild -moderate acute distress.  EYES: Pupils equal, round, reactive to light and accommodation. No scleral icterus.   HEENT: Head atraumatic, normocephalic. Oropharynx and nasopharynx clear. HFNC+ NECK:  Supple, no jugular venous distention. No thyroid enlargement, no tenderness.  LUNGS:normal breath sounds bilaterally, no wheezing, fine rales bilaterally, no rhonchi. No use of accessory muscles of  respiration.  CARDIOVASCULAR: S1, S2 normal. No murmurs, rubs, or gallops. Tachycardia + ABDOMEN: Soft, nontender, nondistended. Bowel sounds present. No organomegaly or mass.  EXTREMITIES: No cyanosis, clubbing  + edema b/l.    NEUROLOGIC: Cranial nerves II thgh XII are intact. No focal Motor or sensory deficits b/l.   PSYCHIATRIC:  patient is alert and oriented x 3. overall deconditioned SKIN: No obvious rash, lesion, or ulcer.   LABORATORY PANEL:  CBC No results for input(s): WBC, HGB, HCT, PLT in the last 168 hours.  Chemistries  Recent Labs  Lab 01/04/20 0508  CREATININE 0.98   Cardiac Enzymes No results for input(s): TROPONINI in the last 168 hours. RADIOLOGY:  DG Chest Port 1 View  Result Date: 01/02/2020 CLINICAL DATA:  Increased shortness of breath, recent COVID EXAM: PORTABLE CHEST 1 VIEW COMPARISON:  12/28/2019 FINDINGS: Persistent bilateral opacities. No pleural effusion or pneumothorax. Stable cardiomediastinal contours. IMPRESSION: Bilateral pulmonary opacities remain present and appear similar to the prior study. Electronically Signed   By: 12/30/2019 M.D.   On: 01/02/2020 14:08   ASSESSMENT AND PLAN:  64 y.o.femalewith medical historyofsarcoidosis and COPD with chronic respiratory failure on2L/min homeO2, hypertension and recently hospitalizedfrom 11/30/19 - 12/30/20withCOVID-19pneumonia andacute on chronicrespiratory failure treated with 5 days of remdesivir and Decadron. She presented to the ED on 1/8with a complaintsof increased cough, weakness and shortness of breath  Acute on chronic respiratory failure with hypoxia in setting of recent Covid-19 pneumonia( admitted 12/26 to 12/30), extensive Sarcoidosis and bronchiectasis -Completed treatment with 5 days remdesivir and 10 days Decadron during that admisison -pt has h/o Extensive  Pulmonary sarcoid along with worsening alveolitis/interstitial lung disease superimposed with recent COVID  infection -Per pulmonology Dr  aleskerov on Bactrim prophylaxis to cover opportunistic infection -IV steroids 60 mg bid (01/03/20) -d/ced Imuran -cont MetaNEB -PT/OT-rec.SNF -pt was requiring 10-15 Liters HFNC --now slowly weaned down to 5 L [place] sats >88%-- with worsening respiratory condition  Pt has been on NRB and HFLC  -prn morphine for "air hunger" -Palliative care consult appreciated   Bilateral lobar pneumonia -In setting of recent COVID-19 pneumonia.  -Completed course of Zithromaxand Rocephin.  Asthma and COPD overlap syndrome -continue treatment as above -Incentive spirometer/flutter valve  Sarcoidosis -Noted to have progressed since 2011 on CT scan obtained on admission jan 9th, 2021 -Appears quite extensive and severe on personal review of CT scan. --Continue steroids as above --F/u with pulmonary rec. As above  Type 2 diabetes Hyperglycemia - secondary to steroids A1c 8.0%. Not on medications for diabetes as outpatient. --continueLantus 12units at bedtime --NovoLog19units with TID with meals  --ssi --Carb modified diet  Left hip apin radiating to the groin--?sciatica  likely from immobility this admission --heating pad --Norco PRN --Robaxin PRN -left hip xray--no acute abnormality -steroids should help  Essential hypertension chronic, stable -Continue Norvasc  Restless leg syndrome chronic, stable -Continue Requip  Generalized weakness secondary to recent acute illnesses, generalized deconditioning and underlying comorbidities. PT/OT rec. snf  Overall decompensated in the last 2 days from respiratory standpoint.  DVT prophylaxis:Lovenox Code Status: DNR/DNI (01/03/2020--after d/w Palliative care) Family Communication:dter Ossian Disposition Plan: SNF ?LTAC Barriers to Discharge: worsening respiratory status requiring NRB/HFNC  TOTAL TIME TAKING CARE OF THIS PATIENT: *25* minutes.  >50% time spent on counselling and  coordination of care  Note: This dictation was prepared with Dragon dictation along with smaller phrase technology. Any transcriptional errors that result from this process are unintentional.  Fritzi Mandes M.D    [facility]   CC: Primary care physician; Ronnell Freshwater, NPPatient ID: Sharol Roussel, female   DOB: 12-01-1956, 64 y.o.   MRN: [id number]

## 2020-01-05 ENCOUNTER — Inpatient Hospital Stay: Payer: 59

## 2020-01-05 LAB — BLOOD GAS, ARTERIAL
Acid-Base Excess: 7.6 mmol/L — ABNORMAL HIGH (ref 0.0–2.0)
Bicarbonate: 32 mmol/L — ABNORMAL HIGH (ref 20.0–28.0)
Delivery systems: POSITIVE
FIO2: 1
Inspiratory PAP: 10
O2 Saturation: 87.8 %
Patient temperature: 37
pCO2 arterial: 43 mmHg (ref 32.0–48.0)
pH, Arterial: 7.48 — ABNORMAL HIGH (ref 7.350–7.450)
pO2, Arterial: 50 mmHg — ABNORMAL LOW (ref 83.0–108.0)

## 2020-01-05 LAB — GLUCOSE, CAPILLARY
Glucose-Capillary: 251 mg/dL — ABNORMAL HIGH (ref 70–99)
Glucose-Capillary: 390 mg/dL — ABNORMAL HIGH (ref 70–99)
Glucose-Capillary: 369 mg/dL — ABNORMAL HIGH (ref 70–99)
Glucose-Capillary: 194 mg/dL — ABNORMAL HIGH (ref 70–99)
Glucose-Capillary: 188 mg/dL — ABNORMAL HIGH (ref 70–99)

## 2020-01-05 MED ORDER — IPRATROPIUM-ALBUTEROL 0.5-2.5 (3) MG/3ML IN SOLN
3.0000 mL | RESPIRATORY_TRACT | Status: DC
  Administered 2020-01-05: 3 mL via RESPIRATORY_TRACT
  Filled 2020-01-05 (×2): qty 3

## 2020-01-05 MED ORDER — LORAZEPAM 2 MG/ML IJ SOLN: 1.0000 mg | Freq: Once | INTRAMUSCULAR | Status: AC

## 2020-01-05 MED ORDER — MORPHINE SULFATE (PF) 2 MG/ML IV SOLN
1.0000 mg | INTRAVENOUS | Status: DC | PRN
  Administered 2020-01-05 (×2): 1 mg via INTRAVENOUS
  Filled 2020-01-05 (×3): qty 1

## 2020-01-05 MED ORDER — DEXMEDETOMIDINE HCL IN NACL 400 MCG/100ML IV SOLN
0.4000 ug/kg/h | INTRAVENOUS | Status: DC
  Administered 2020-01-05: 0.4 ug/kg/h via INTRAVENOUS
  Filled 2020-01-05: qty 100

## 2020-01-05 MED ORDER — CHLORHEXIDINE GLUCONATE CLOTH 2 % EX PADS
6.0000 | MEDICATED_PAD | Freq: Every day | CUTANEOUS | Status: DC
  Administered 2020-01-05: 6 via TOPICAL

## 2020-01-05 MED ORDER — INSULIN ASPART 100 UNIT/ML ~~LOC~~ SOLN
0.0000 [IU] | SUBCUTANEOUS | Status: DC
  Administered 2020-01-06: 15 [IU] via SUBCUTANEOUS
  Filled 2020-01-05: qty 1

## 2020-01-05 MED ORDER — LORAZEPAM 2 MG/ML IJ SOLN: 0.5000 mg | INTRAMUSCULAR | Status: DC | PRN

## 2020-01-05 MED ORDER — MORPHINE SULFATE (PF) 2 MG/ML IV SOLN
1.0000 mg | INTRAVENOUS | Status: DC | PRN
  Administered 2020-01-06: 2 mg via INTRAVENOUS
  Filled 2020-01-05: qty 1

## 2020-01-05 MED ORDER — MORPHINE SULFATE (PF) 2 MG/ML IV SOLN
2.0000 mg | INTRAVENOUS | Status: AC
  Administered 2020-01-05: 2 mg via INTRAVENOUS

## 2020-01-05 MED ORDER — LORAZEPAM 2 MG/ML IJ SOLN
INTRAMUSCULAR | Status: AC
  Administered 2020-01-05: 1 mg via INTRAVENOUS
  Filled 2020-01-05: qty 1

## 2020-01-05 NOTE — Consult Note (Addendum)
Name: Marissa Luna MRN: [id number] DOB: 1956/02/21    ADMISSION DATE:  12/19/2019 CONSULTATION DATE: 01/05/2020  REFERRING MD : Webb Silversmith, NP   CHIEF COMPLAINT: Shortness of Breath   BRIEF PATIENT DESCRIPTION:  64 yo female diagnosed with COVID-19 on 12/26 received 5 day course of remdesivir and decadron now admitted with acute on chronic hypoxic respiratory failure secondary to COVID-19 pneumonitis/fibrosis complicated by pulmonary sarcoidosis, COPD, and asthma requiring continuous Bipap  SIGNIFICANT EVENTS/STUDIES:  01/9: Pt admitted to the [facility] unit with acute on chronic hypoxic respiratory failure secondary to COVID-19 pneumonia  01/9: CTA Chest revealed No central, segmental, or subsegmental pulmonary embolism. Extensive bilateral hilar and mediastinal calcified adenopathy with extensive posterior lower lung bronchiectasis, honeycombing, and subpleural bleb formation. There has been interval progression in the lung changes since the prior exam, consistent with progression of the patient's known sarcoidosis. Hazy/ ground-glass opacities seen within both lungs, left greater than right, consistent with COVID pneumonia. Mild cardiomegaly 01/23: Pulmonology consulted reviewed numerous chest imaging (CT and X-rays) findings consistent with post COVID-19 pneumonitis/fibrosis complicated by stage IV pulmonary sarcoidosis, COPD, and asthma although respiratory decline concerning for PJP therefore bactrim ordered  01/29: Palliative Care consulted per pt and family request code status changed to DNR/DNI   01/31: Due to worsening acute on chronic hypoxic respiratory failure pt transferred to the stepdown unit for Bipap.  However, pts respiratory status continued to decline requiring 100% FiO2 via continuous Bipap pts status changed to ICU.  PCCM assumed care   MICRO DATA: Blood x2 01/08>>negative  Respiratory Panel by PCR 01/9>>negative  MRSA 01/10>>negative  Respiratory 01/11>>few  consistent with normal flora  HISTORY OF PRESENT ILLNESS:   This is a 64 yo female with a PMH of Sarcoidosis, HTN, Environmental Allergies, Asthma, COPD on home O2 @2L . She was recently hospitalized due to COVID-19 pneumonia 12/26-12/30 received 5 day course of remdesivir/decadron and discharged home on chronic home O2 @2L .  She returned to [facility] ER on 01/9 with worsening shortness of breath, weakness, and cough. In the ER pt noted to be hypoxic requiring NRB initially than transitioned to HFNC.  CTA Chest negative for pulmonary embolism, however revealed bilateral ground glass opacities consistent with COVID-19 pneumonia and progression of sarcoidosis.  She received iv abx and decadron.  She was subsequently admitted to the [facility] unit per hospitalist team for additional workup and treatment.  Detailed hospital course described above under significant events.   PAST MEDICAL HISTORY :   has a past medical history of Asthma, Environmental allergies, Hypertension, and Sarcoidosis.  has a past surgical history that includes Cholecystectomy and Colonoscopy (N/A, 10/26/2015). Prior to Admission medications   Medication Sig Start Date End Date Taking? Authorizing Provider  albuterol (PROVENTIL) (2.5 MG/3ML) 0.083% nebulizer solution Take 3 mLs (2.5 mg total) by nebulization every 6 (six) hours as needed. Patient taking differently: Take 2.5 mg by nebulization every 6 (six) hours as needed for wheezing or shortness of breath.  06/12/18  Yes Boscia, Heather E, NP  albuterol (VENTOLIN HFA) 108 (90 Base) MCG/ACT inhaler INHALE 2 PUFFS 4 TIMES A DAY AS NEEDED FOR WHEEZING Patient taking differently: Inhale 2 puffs into the lungs every 6 (six) hours as needed for wheezing.  10/08/19  Yes Boscia, Heather E, NP  amLODipine (NORVASC) 10 MG tablet Take 1 tablet (10 mg total) by mouth daily. 10/08/19  Yes Boscia, 13/3/20, NP  azithromycin (ZITHROMAX) 250 MG tablet Take as directed Patient taking differently: Take  250-500 mg  by mouth daily. Take 2 tablets (500mg ) on day 1 then 1 tablet (250mg ) on days 2-5 12/10/19  Yes Scarboro, , NP  Calcium Carbonate-Vitamin D 600-400 MG-UNIT tablet Take 1 tablet by mouth daily.    Yes [provider]  cetirizine (ZYRTEC) 10 MG tablet Take 10 mg by mouth.   Yes [provider]  fluticasone (FLONASE) 50 MCG/ACT nasal spray Place 2 sprays into the nose daily.    Yes [provider]  guaiFENesin-dextromethorphan (ROBITUSSIN DM) 100-10 MG/5ML syrup Take 10 mLs by mouth every 4 (four) hours as needed for cough. 12/04/19  Yes Sreenath, Sudheer B, MD  predniSONE (DELTASONE) 5 MG tablet TAKE 1 TABLET BY MOUTH EVERY DAY WITH BREAKFAST Patient taking differently: Take 5 mg by mouth daily with breakfast.  08/09/19  Yes Scarboro, 12/06/19, NP  rOPINIRole (REQUIP) 0.25 MG tablet Take 1 tablet (0.25 mg total) by mouth at bedtime. 03/19/19  Yes Boscia, Heather E, NP  TRELEGY ELLIPTA 100-62.5-25 MCG/INH AEPB Inhale 1 puff into the lungs daily. 12/13/18  Yes 01-03-1986, MD  Vitamin D, Ergocalciferol, (DRISDOL) 1.25 MG (50000 UT) CAPS capsule Take 1 capsule (50,000 Units total) by mouth once a week. 10/10/19  Yes Boscia, Heather E, NP  enoxaparin (LOVENOX) 40 MG/0.4ML injection Inject 0.4 mLs (40 mg total) into the skin daily. 12/19/19   13/5/20, DO  feeding supplement, ENSURE ENLIVE, (ENSURE ENLIVE) LIQD Take 237 mLs by mouth 2 (two) times daily between meals. 12/19/19   Pennie Banter, DO  fluticasone (FLONASE) 50 MCG/ACT nasal spray Place 2 sprays into both nostrils daily. 12/20/19   Pennie Banter A, DO  fluticasone furoate-vilanterol (BREO ELLIPTA) 100-25 MCG/INH AEPB Inhale 1 puff into the lungs daily. 12/20/19   Esaw Grandchild, DO  furosemide (LASIX) 20 MG tablet Take 1 tablet (20 mg total) by mouth daily. 12/20/19   Pennie Banter A, DO  insulin aspart (NOVOLOG) 100 UNIT/ML injection Inject 0-9 Units into the skin 3 (three) times daily with  meals. 12/19/19   Esaw Grandchild A, DO  insulin aspart (NOVOLOG) 100 UNIT/ML injection Inject 0-5 Units into the skin at bedtime. 12/19/19   Esaw Grandchild A, DO  insulin aspart (NOVOLOG) 100 UNIT/ML injection Inject 3 Units into the skin 3 (three) times daily with meals. 12/19/19   Esaw Grandchild A, DO  insulin glargine (LANTUS) 100 UNIT/ML injection Inject 0.06 mLs (6 Units total) into the skin at bedtime. 12/19/19   Esaw Grandchild, DO  Ipratropium-Albuterol (COMBIVENT) 20-100 MCG/ACT AERS respimat Inhale 1 puff into the lungs every 6 (six) hours. 12/19/19   Pennie Banter, DO  loratadine (CLARITIN) 10 MG tablet Take 1 tablet (10 mg total) by mouth daily. 12/20/19   Pennie Banter A, DO  methylPREDNISolone sodium succinate (SOLU-MEDROL) 40 mg/mL injection Inject 1 mL (40 mg total) into the vein daily. 12/20/19   Esaw Grandchild, DO  Multiple Vitamin (MULTIVITAMIN WITH MINERALS) TABS tablet Take 1 tablet by mouth daily. 12/20/19   Pennie Banter A, DO  OXYGEN Inhale 2 L into the lungs.    [provider]  sodium chloride (OCEAN) 0.65 % SOLN nasal spray Place 1 spray into both nostrils as needed for congestion. 12/19/19   Esaw Grandchild A, DO  sodium chloride 0.9 % infusion Inject 10 mLs into the vein as needed (for administration of IV medications (carrier fluid)). 12/19/19   Esaw Grandchild A, DO  umeclidinium bromide (INCRUSE ELLIPTA) 62.5 MCG/INH AEPB Inhale 1 puff  into the lungs daily. 12/20/19   Ezekiel Slocumb, DO   No Known Allergies  FAMILY HISTORY:  family history includes Breast cancer in her sister; Colon cancer in her father; Diabetes in her sister; Lung cancer in her mother. SOCIAL HISTORY:  reports that she has never smoked. She has never used smokeless tobacco. She reports that she does not drink alcohol or use drugs.  REVIEW OF SYSTEMS:   Unable to assess pt in severe respiratory distress   SUBJECTIVE:  Pt in severe respiratory distress   VITAL SIGNS: Temp:   [97.8 F (36.6 C)-98.6 F (37 C)] 98.6 F (37 C) (01/31 1257) Pulse Rate:  [95-123] 105 (01/31 2100) Resp:  [21-35] 24 (01/31 2100) BP: (113-159)/(54-78) 113/54 (01/31 2100) SpO2:  [78 %-98 %] 78 % (01/31 2313) FiO2 (%):  [100 %] 100 % (01/31 1130)  PHYSICAL EXAMINATION: General: acutely ill appearing female, in acute respiratory distress  Neuro: awake, follows commands, PERRL  HEENT: JVD present  Cardiovascular: sinus tach, no R/G  Lungs: diffuse rhonchi throughout, labored, tachypneic  Abdomen: +BS x4, obese, soft, non tender, non distended  Musculoskeletal: normal bulk and tone, no edema  Skin: intact no rashes or lesions present   Recent Labs  Lab 01/04/20 0508  CREATININE 0.98   No results for input(s): HGB, HCT, WBC, PLT in the last 168 hours. DG Chest 1 View  Result Date: 01/05/2020 CLINICAL DATA:  Shortness of breath. EXAM: CHEST  1 VIEW COMPARISON:  01/02/2020 FINDINGS: Again noted are diffuse bilateral airspace opacities with interval worsening since the prior study. There is no pneumothorax. No large pleural effusion. There is no acute osseous abnormality. The heart size remains stable. IMPRESSION: Worsening bilateral pulmonary opacities. Electronically Signed   By: Constance Holster M.D.   On: 01/05/2020 23:05    ASSESSMENT / PLAN:  Acute on chronic hypoxic respiratory failure secondary to COVID-19 pneumonitis/fibrosis complicated by pulmonary sarcoidosis, COPD, and asthma  Prn Bipap or supplemental O2 for dyspnea and/or hypoxia Continue bactrim for possible PCP  Scheduled and prn bronchodilator therapy  Continue iv steroids and vitamins  Prn morphine for air hunger  Precedex gtt for anxiety/restlessness  Trend WBC and monitor fever curve   Best practice: VTE px: subq lovenox SUP px: famotidine Diet: NPO   Pt's respiratory status has declined rapidly despite continuous Bipap with FiO2 @100 % pt remains severely hypoxic with O2 sats ranging between 68-80%.   Pt also noted to have increased work of breathing with worsening air hunger and restlessness. Therefore, pt received prn morphine and precedex gtt started. Confirmed with pt she does NOT want to be placed on mechanical ventilation and per her request will remain DNR/DNI.  I notified pts daughter Marissa Luna via telephone regarding decline in pts condition and continued hypoxia despite continuous Bipap.  I encouraged her to arrive at pts bedside and she stated she was on her way.  Will continue to monitor and assess pt.   Marda Stalker, [place] Pager [phone] (please enter 7 digits) PCCM Consult Pager [phone] (please enter 7 digits)

## 2020-01-05 NOTE — Progress Notes (Signed)
Patient presenting with increased SOB and work of breathing, RR 50's and sats in the 70's on 100% BiPAP. On call provider made aware. New orders received. Orders noted and carried out. Patient is now 88% sats with moderate use of accessory muscles but patient appear more comfortable. Now on Precedex as ordered.

## 2020-01-05 NOTE — Progress Notes (Signed)
[place] at [place] NAME: Marissa Luna    MR#:  [id number]  DATE OF BIRTH:  07-Mar-1956  SUBJECTIVE:  patient seen earlier was doing okay however after getting on the bed pan she desaturated down in the 80s despite being on laundry breathing on high flow nasal cannula oxygen became very tachypnea tachycardic and increased work of breathing with fatigue ability. Moving patient to ICU.  REVIEW OF SYSTEMS:   Review of Systems  Constitutional: Positive for malaise/fatigue. Negative for chills, fever and weight loss.  HENT: Negative for ear discharge, ear pain and nosebleeds.   Eyes: Negative for blurred vision, pain and discharge.  Respiratory: Positive for cough and shortness of breath. Negative for sputum production, wheezing and stridor.   Cardiovascular: Negative for chest pain, palpitations, orthopnea and PND.  Gastrointestinal: Negative for abdominal pain, diarrhea, nausea and vomiting.  Genitourinary: Negative for frequency and urgency.  Musculoskeletal: Positive for joint pain. Negative for back pain.  Neurological: Positive for weakness. Negative for sensory change, speech change and focal weakness.  Psychiatric/Behavioral: Negative for depression and hallucinations. The patient is not nervous/anxious.    Tolerating Diet: yes  DRUG ALLERGIES:  No Known Allergies  VITALS:  Blood pressure 130/66, pulse (!) 115, temperature 98.6 F (37 C), temperature source Axillary, resp. rate (!) 35, height 4\' 11"  (1.499 m), weight 70.6 kg, SpO2 91 %.  PHYSICAL EXAMINATION:   Physical Exam  GENERAL:  64 y.o.-year-old patient lying in the bed with severe acute expiratory distress.  EYES: Pupils equal, round, reactive to light and accommodation. No scleral icterus.   HEENT: Head atraumatic, normocephalic. Oropharynx and nasopharynx clear. HFNC+ and NRB NECK:  Supple, no jugular venous distention. No thyroid enlargement, no tenderness.  LUNGS:  shallow l breath sounds bilaterally, no wheezing, fine rales bilaterally, no rhonchi. ++ use of accessory muscles of respiration.  CARDIOVASCULAR: S1, S2 normal. No murmurs, rubs, or gallops. Tachycardia + ABDOMEN: Soft, nontender, nondistended. Bowel sounds present. No organomegaly or mass.  EXTREMITIES: No cyanosis, clubbing  + edema b/l.    NEUROLOGIC: Cranial nerves II thgh XII are intact. No focal Motor or sensory deficits b/l.   PSYCHIATRIC:  patient is alert and oriented x 3. overall deconditioned SKIN: No obvious rash, lesion, or ulcer.   LABORATORY PANEL:  CBC No results for input(s): WBC, HGB, HCT, PLT in the last 168 hours.  Chemistries  Recent Labs  Lab 01/04/20 0508  CREATININE 0.98   Cardiac Enzymes No results for input(s): TROPONINI in the last 168 hours. RADIOLOGY:  No results found. ASSESSMENT AND PLAN:  64 y.o.femalewith medical historyofsarcoidosis and COPD with chronic respiratory failure on2L/min homeO2, hypertension and recently hospitalizedfrom 11/30/19 - 12/30/20withCOVID-19pneumonia andacute on chronicrespiratory failure treated with 5 days of remdesivir and Decadron. She presented to the ED on 1/8with a complaintsof increased cough, weakness and shortness of breath  Acute on chronic respiratory failure with hypoxia in setting of recent Covid-19 pneumonia( admitted 12/26 to 12/30), extensive Sarcoidosis and bronchiectasis -Completed treatment with 5 days remdesivir and 10 days Decadron during that admisison -pt has h/o Extensive  Pulmonary sarcoid along with worsening alveolitis/interstitial lung disease superimposed with recent COVID infection -Per pulmonology Dr Lanney Gins on Bactrim prophylaxis to cover opportunistic infection -IV steroids 60 mg bid (01/03/20) with daily as needed Lasix -d/ced Imuran -cont MetaNEB -pt was requiring 10-15 Liters HFNC along with nonrebreather---> to ICU for BiPAP trial -prn morphine for "air  hunger" -Palliative care consult appreciated -- DNR/DNI -  Dr. Belia Heman aware  Bilateral lobar pneumonia -In setting of recent COVID-19 pneumonia.  -Completed course of Zithromaxand Rocephin.  Asthma and COPD overlap syndrome -continue treatment as above -Incentive spirometer/flutter valve  Sarcoidosis -Noted to have progressed since 2011 on CT scan obtained on admission jan 9th, 2021 -Appears quite extensive and severe on personal review of CT scan. --Continue steroids as above --F/u with pulmonary rec. As above  Type 2 diabetes Hyperglycemia - secondary to steroids A1c 8.0%. Not on medications for diabetes as outpatient. --continueLantus 12units at bedtime --NovoLog19units with TID with meals  --ssi --Carb modified diet  Left hip apin radiating to the groin--?sciatica  likely from immobility this admission --heating pad --Norco PRN --Robaxin PRN -left hip xray--no acute abnormality -steroids should help  Essential hypertension chronic, stable -Continue Norvasc  Restless leg syndrome chronic, stable -Continue Requip  Generalized weakness secondary to recent acute illnesses, generalized deconditioning and underlying comorbidities. PT/OT rec. snf  Overall decompensated in the last 3 days from respiratory standpoint. -Overall poor prognosis.  DVT prophylaxis:Lovenox Code Status: DNR/DNI (01/03/2020--after d/w Palliative care) Family Communication:dter [name] Disposition Plan: SNF ?LTAC Barriers to Discharge: worsening respiratory status requiring NRB/HFNC  TOTAL TIME TAKING CARE OF THIS PATIENT: *25* minutes.  >50% time spent on counselling and coordination of care  Note: This dictation was prepared with Dragon dictation along with smaller phrase technology. Any transcriptional errors that result from this process are unintentional.  Enedina Finner M.D    [facility]   CC: Primary care physician; Carlean Jews, NPPatient ID:  Abigail Miyamoto, female   DOB: October 16, 1956, 64 y.o.   MRN: [id number]

## 2020-01-05 NOTE — Progress Notes (Signed)
Patient ID: Marissa Luna, female   DOB: [birth year], 64 y.o.   MRN: [id number]  Patient desated into 85% after she got off the bed pan.  Patient has been on nonrebreather+HFNC oxygen continuously and she remains tachycardic along with tachypnea with respiratory rate in the 30s. She received her morphine without any improvement. Discussed with patient to move her to step down for BiPAP. Spoke with daughter Vickie Epley and informed her. She does understand poor lung condition with patient.  Patient is DNR/DNI

## 2020-01-05 NOTE — Progress Notes (Signed)
PT Cancellation Note  Patient Details Name: Marissa Luna MRN: [id number] DOB: 11/18/1956   Cancelled Treatment:    Reason Eval/Treat Not Completed: Medical issues which prohibited therapy Pt with medical issues (O2 and HR) necessitating transfer to CCU.  Will complete PT orders at this time, will need new orders when she is appropriate for PT intervention.     Malachi Pro 01/05/2020, 2:21 PM

## 2020-01-05 NOTE — Progress Notes (Signed)
Patient moving to use bedpan and became SOB, RN called by NT. Patient oxygenation 82-84%,34 RRon 100%nonrebreather and 15L HFNC, RR . Morphine and Combivent inhaler given.  Oxygenation increased 89%-92%, RR 24 with rest but desats with any exertion. MD notified.

## 2020-01-06 ENCOUNTER — Other Ambulatory Visit: Payer: Self-pay | Admitting: *Deleted

## 2020-01-06 LAB — PROCALCITONIN: Procalcitonin: 2.31 ng/mL

## 2020-01-06 LAB — GLUCOSE, CAPILLARY: Glucose-Capillary: 467 mg/dL — ABNORMAL HIGH (ref 70–99)

## 2020-01-06 MED ORDER — MORPHINE BOLUS VIA INFUSION
2.0000 mg | INTRAVENOUS | Status: DC | PRN
  Administered 2020-01-06: 05:00:00 4 mg via INTRAVENOUS
  Filled 2020-01-06: qty 4

## 2020-01-06 MED ORDER — MORPHINE BOLUS VIA INFUSION
2.0000 mg | INTRAVENOUS | Status: DC | PRN
  Filled 2020-01-06: qty 2

## 2020-01-06 MED ORDER — LORAZEPAM 2 MG/ML IJ SOLN
2.0000 mg | Freq: Once | INTRAMUSCULAR | Status: AC
  Administered 2020-01-06: 05:00:00 2 mg via INTRAVENOUS
  Filled 2020-01-06: qty 1

## 2020-01-06 MED ORDER — BIOTENE DRY MOUTH MT LIQD: 15.0000 mL | OROMUCOSAL | Status: DC | PRN

## 2020-01-06 MED ORDER — LORAZEPAM 2 MG/ML IJ SOLN
1.0000 mg | INTRAMUSCULAR | Status: DC | PRN
  Administered 2020-01-06: 04:00:00 2 mg via INTRAVENOUS

## 2020-01-06 MED ORDER — SULFAMETHOXAZOLE-TRIMETHOPRIM 400-80 MG/5ML IV SOLN
350.0000 mg | Freq: Three times a day (TID) | INTRAVENOUS | Status: DC
  Administered 2020-01-06: 350 mg via INTRAVENOUS
  Filled 2020-01-06 (×2): qty 21.88

## 2020-01-06 MED ORDER — INSULIN ASPART 100 UNIT/ML ~~LOC~~ SOLN
10.0000 [IU] | Freq: Once | SUBCUTANEOUS | Status: AC
  Administered 2020-01-06: 10 [IU] via SUBCUTANEOUS

## 2020-01-06 MED ORDER — HALOPERIDOL LACTATE 5 MG/ML IJ SOLN: 0.5000 mg | INTRAMUSCULAR | Status: DC | PRN

## 2020-01-06 MED ORDER — ONDANSETRON HCL 4 MG/2ML IJ SOLN: 4.0000 mg | Freq: Four times a day (QID) | INTRAMUSCULAR | Status: DC | PRN

## 2020-01-06 MED ORDER — LORAZEPAM 2 MG/ML IJ SOLN
INTRAMUSCULAR | Status: AC
  Filled 2020-01-06: qty 1

## 2020-01-06 MED ORDER — POLYVINYL ALCOHOL 1.4 % OP SOLN
1.0000 [drp] | Freq: Four times a day (QID) | OPHTHALMIC | Status: DC | PRN
  Filled 2020-01-06: qty 15

## 2020-01-06 MED ORDER — GLYCOPYRROLATE 0.2 MG/ML IJ SOLN: 0.2000 mg | INTRAMUSCULAR | Status: DC | PRN

## 2020-01-06 MED ORDER — MORPHINE 100MG IN NS 100ML (1MG/ML) PREMIX INFUSION
1.0000 mg/h | INTRAVENOUS | Status: DC
  Administered 2020-01-06: 04:00:00 1 mg/h via INTRAVENOUS
  Filled 2020-01-06: qty 100

## 2020-01-06 DEATH — deceased

## 2020-02-02 ENCOUNTER — Other Ambulatory Visit: Payer: Self-pay | Admitting: Adult Health

## 2020-02-02 DIAGNOSIS — D860 Sarcoidosis of lung: Secondary | ICD-10-CM

## 2020-02-03 ENCOUNTER — Ambulatory Visit: Payer: BLUE CROSS/BLUE SHIELD | Admitting: Internal Medicine

## 2020-02-03 NOTE — Progress Notes (Signed)
Assisted tele visit to patient with daughter.  Ronda Rajkumar McEachran, RN  

## 2020-02-03 NOTE — Plan of Care (Signed)
Patient's health is declining significantly. Diagnostic test are not improving. Respiratory complications are not improving and clinical measures are not within normal limits. Patient is awake and alert and has been informed about the possibility of respirations ceasing if not intubated. Patient continued to refuse intubation. Patient is DNR. NP wrote orders allowing RN to pronounce death when it occurs. Will continue to make patient as comfortable as possible. Will continue to give morphine for air hunger. Precedex drip maintained. Awaiting the daughter arrival at this time.

## 2020-02-03 NOTE — Patient Outreach (Signed)
Case closed.  Pt expired 01/12/2020.  Irving Shows [facility], BSN [facility] Care Coordinator [phone]

## 2020-02-03 NOTE — Progress Notes (Signed)
Pharmacy Antibiotic Note  [name] is a 64 y.o. female admitted on 12/26/19 with pneumonia w/ worsening bilateral opacities w/ h/o sarcoidosis and on bactrim for PCP prophylaxis, but felt to be emergence of PCP.  Pharmacy has been consulted for bactrim dosing.  Plan: Will start patient on bactrim 15 mg/kg/day of trimethoprim q8h IV   Will give bactrim 350 mg IV q8h per CrCl > 30 ml/min.  Patient has actually become comfort care and orders for IV bactrim have been d/c'd.  Height: 4\' 11"  (149.9 cm) Weight: 155 lb 11.2 oz (70.6 kg) IBW/kg (Calculated) : 43.2  Temp (24hrs), Avg:98.5 F (36.9 C), Min:97.8 F (36.6 C), Max:99.1 F (37.3 C)  Recent Labs  Lab 01/04/20 0508  CREATININE 0.98    Estimated Creatinine Clearance: 49.6 mL/min (by C-G formula based on SCr of 0.98 mg/dL).    No Known Allergies  Thank you for allowing pharmacy to be a part of this patient's care.  01/26/2020, PharmD, BCPS Clinical Pharmacist 01/13/2020 3:40 AM

## 2020-02-03 NOTE — Progress Notes (Signed)
Pts husband Feiga Nadel arrived at bedside I discussed decline in pt condition despite aggressive treatment. He stated he understands and does not want Mrs. Cataldi to suffer.  At this time pts daughter Vickie Epley would like to spend more time with the pt prior to making a decision to transition to comfort measures only.  Will continue to monitor and assess pt.  Sonda Rumble, AGNP  Pulmonary/Critical Care Pager [phone] (please enter 7 digits) PCCM Consult Pager [phone] (please enter 7 digits)

## 2020-02-03 NOTE — Death Summary Note (Signed)
DEATH SUMMARY   Patient Details  Name: Marissa Luna MRN: [id number] DOB: 09/21/1956  Admission/Discharge Information   Admit Date:  2020-01-01  Date of Death: Date of Death: (P) 2020/01/25  Time of Death: Time of Death: (P) 0548  Length of Stay: 23  Referring Physician: Carlean Jews, NP   Reason(s) for Hospitalization  Shortness of Breath   Diagnoses  Preliminary cause of death: COVID-19 Secondary Diagnoses (including complications and co-morbidities):  Principal Problem:   Acute on chronic respiratory failure with hypoxia (HCC) Active Problems:   Sarcoidosis of lung (HCC)   Essential hypertension   Weakness   Healthcare-associated pneumonia   History of COVID-19   Acute respiratory failure (HCC)   Multifocal pneumonia   Type 2 diabetes mellitus without complication, without long-term current use of insulin (HCC)   Shortness of breath   Goals of care, counseling/discussion   Palliative care by specialist   COVID-19 Pneumonitis/Fibrosis   Brief Hospital Course (including significant findings, care, treatment, and services provided and events leading to death)  Marissa Luna is a 64 y.o. year old female who was recently hospitalized due to  COVID-19 pneumonia 11/30/2019-12/04/2019 requiring 5 day course of remdesivir/decadron and discharged home on chronic home O2 @2L .  She returned to [facility] ER on 12/14/2019 with worsening shortness of breath, weakness, and cough. In the ER pt noted to be hypoxic requiring NRB initially than transitioned to HFNC.  CTA Chest negative for pulmonary embolism, however revealed bilateral ground glass opacities consistent with COVID-19 pneumonia and progression of sarcoidosis.  She received iv abx and decadron.  She was subsequently admitted to the [facility] unit per hospitalist team for additional workup and treatment. On 01/05/2020 she developed worsening acute on chronic hypoxic respiratory failure requiring transfer to the stepdown unit for continuous  Bipap with FiO2 @100 %.  Despite aggressive treatment she developed worsening hypoxia requiring transfer to ICU.  Pt confirmed DNR/DNI status.  Due to continued decline in pts condition pts family notified to arrive at pts bedside. Upon their arrival at pts bedside pt transitioned to comfort measures only per family request on 2020/01/25. Pt expired 01/25/20 at 0548 am with family present at bedside.  Pertinent Labs and Studies  Significant Diagnostic Studies DG Chest 1 View  Result Date: 01/05/2020 CLINICAL DATA:  Shortness of breath. EXAM: CHEST  1 VIEW COMPARISON:  01/02/2020 FINDINGS: Again noted are diffuse bilateral airspace opacities with interval worsening since the prior study. There is no pneumothorax. No large pleural effusion. There is no acute osseous abnormality. The heart size remains stable. IMPRESSION: Worsening bilateral pulmonary opacities. Electronically Signed   By: 01/07/2020 M.D.   On: 01/05/2020 23:05   CT Angio Chest PE W and/or Wo Contrast  Result Date: 12/14/2019 CLINICAL DATA:  Shortness of breath EXAM: CT ANGIOGRAPHY CHEST WITH CONTRAST TECHNIQUE: Multidetector CT imaging of the chest was performed using the standard protocol during bolus administration of intravenous contrast. Multiplanar CT image reconstructions and MIPs were obtained to evaluate the vascular anatomy. CONTRAST:  11-19-1992 OMNIPAQUE IOHEXOL 350 MG/ML SOLN COMPARISON:  Apr 22, 2010 FINDINGS: Cardiovascular: There is a optimal opacification of the pulmonary arteries. There is no central,segmental, or subsegmental filling defects within the pulmonary arteries. There is mild cardiomegaly. No evidence of right ventricular heart strain. There is normal three-vessel brachiocephalic anatomy without proximal stenosis. The thoracic aorta is normal in appearance. Mediastinum/Nodes: Again noted are extensively calcified mediastinal and bilateral hilar lymph nodes. Thyroid gland, trachea, and esophagus demonstrate no  significant findings. Lungs/Pleura: There is extensive bilateral bronchial wall thickening and bronchiectasis with honeycombing most notable in the posterior lower lungs with subpleural bleb formation. There has been interval progression since the prior exam dating back to 20/11. There is new hazy/ground-glass opacity seen within the periphery of the bilateral lungs, left slightly greater than right. No pleural effusion is seen. Upper Abdomen: No acute abnormalities present in the visualized portions of the upper abdomen. Musculoskeletal: No chest wall abnormality. No acute or significant osseous findings. Review of the MIP images confirms the above findings. IMPRESSION: 1. No central, segmental, or subsegmental pulmonary embolism. 2. Extensive bilateral hilar and mediastinal calcified adenopathy with extensive posterior lower lung bronchiectasis, honeycombing, and subpleural bleb formation. There has been interval progression in the lung changes since the prior exam, consistent with progression of the patient's known sarcoidosis. 3. Hazy/ground-glass opacities seen within both lungs, left greater than right, consistent with COVID pneumonia. 4. Mild cardiomegaly Electronically Signed   By: Jonna Clark M.D.   On: 12/14/2019 01:18   DG Chest Port 1 View  Result Date: 01/02/2020 CLINICAL DATA:  Increased shortness of breath, recent COVID EXAM: PORTABLE CHEST 1 VIEW COMPARISON:  12/28/2019 FINDINGS: Persistent bilateral opacities. No pleural effusion or pneumothorax. Stable cardiomediastinal contours. IMPRESSION: Bilateral pulmonary opacities remain present and appear similar to the prior study. Electronically Signed   By: Guadlupe Spanish M.D.   On: 01/02/2020 14:08   DG Chest Port 1 View  Result Date: 12/28/2019 CLINICAL DATA:  Acute on chronic respiratory failure with hypoxia secondary to COVID. COVID positive on 11/30/2019. EXAM: PORTABLE CHEST 1 VIEW COMPARISON:  Chest x-ray dated 2020/01/03. FINDINGS:  Persistent opacities within the mid/upper lungs bilaterally, compatible with previously described COVID pneumonia, superimposed on extensive chronic interstitial lung disease/fibrosis. No appreciable change compared to the most recent chest x-ray of Jan 03, 2020. No pleural effusion or pneumothorax is seen. Heart size and mediastinal contours are stable. No acute or suspicious osseous finding. IMPRESSION: Stable chest x-ray. Persistent opacities within the mid/upper lungs bilaterally, compatible with previously described COVID pneumonia superimposed on extensive chronic interstitial lung disease/fibrosis. Electronically Signed   By: Bary Richard M.D.   On: 12/28/2019 12:54   DG Chest Port 1 View  Result Date: 2020/01/03 CLINICAL DATA:  64 year old female with shortness of breath. EXAM: PORTABLE CHEST 1 VIEW COMPARISON:  Chest radiograph dated 11/30/2019. FINDINGS: Background of fibrotic changes primarily involving the left mid to lower lung field. Slight increased density primarily over the left lung may be chronic but appears more prominent compared to the prior radiograph and developing infiltrate is not excluded. Clinical correlation is recommended. No pleural effusion or pneumothorax. The cardiac silhouette is within normal limits. Calcified hilar and mediastinal granuloma. No acute osseous pathology. IMPRESSION: 1. Slight increased density primarily in the left mid to lower lung field. Developing infiltrate is not excluded. Clinical correlation is recommended. 2. Calcified hilar and mediastinal granuloma. Electronically Signed   By: Elgie Collard M.D.   On: 01/03/20 23:28   DG HIP UNILAT WITH PELVIS 2-3 VIEWS LEFT  Result Date: 01/01/2020 CLINICAL DATA:  Left hip pain. EXAM: DG HIP (WITH OR WITHOUT PELVIS) 2-3V LEFT COMPARISON:  None. FINDINGS: There is no evidence of hip fracture or dislocation. There is no evidence of arthropathy or other focal bone abnormality. IMPRESSION: Negative. Electronically  Signed   By: Francene Boyers M.D.   On: 01/01/2020 12:51    Microbiology Recent Results (from the past 240 hour(s))  Aspergillus Ag, BAL/Serum  Status: None   Collection Time: 12/28/19  6:55 PM  Result Value Ref Range Status   Aspergillus Ag, BAL/Serum 0.05 0.00 - 0.49 Index Final    Comment: (NOTE) Performed At: [facility] [place], [place] [id number] Rush Farmer MD GQ:[id number] Performed At: [facility] [address] [place], [place] [id number] Katina Degree MDPhD [id number]     Lab Basic Metabolic Panel: Recent Labs  Lab 01/04/20 0508  CREATININE 0.98   Liver Function Tests: No results for input(s): AST, ALT, ALKPHOS, BILITOT, PROT, ALBUMIN in the last 168 hours. No results for input(s): LIPASE, AMYLASE in the last 168 hours. No results for input(s): AMMONIA in the last 168 hours. CBC: No results for input(s): WBC, NEUTROABS, HGB, HCT, MCV, PLT in the last 168 hours. Cardiac Enzymes: No results for input(s): CKTOTAL, CKMB, CKMBINDEX, TROPONINI in the last 168 hours. Sepsis Labs: Recent Labs  Lab 01/05/20 2337  PROCALCITON 2.31    Procedures/Operations  None   Marda Stalker, [place] Pager [phone] (please enter 7 digits) PCCM Consult Pager [phone] (please enter 7 digits)

## 2020-02-03 NOTE — Progress Notes (Addendum)
Patient's tubing dislodged from the BiPAP while patient was turning and she desatted to 44%. Now sats are 70% with BiPAP at 100%. Precedex increased to 0.22mcg/kg/min at this time. Patient is awake and alert. HOB elevated. NP called patient's daughter and updated her on patient's current status.

## 2020-02-03 NOTE — Progress Notes (Signed)
Pts daughter Marissa Luna arrived at bedside and now awaiting arrival of patients husband Marissa Luna. Due to lack of improvement in pts oxygenation/respiratory status I discussed consideration of comfort measures only.  Mrs. Marissa Luna asked appropriate questions and will discuss this with pts husband Marissa Luna when he arrives at bedside.  Will continue to monitor and assess pt   Marissa Luna, [facility]  Pulmonary/Critical Care Pager [phone] (please enter 7 digits) PCCM Consult Pager [phone] (please enter 7 digits)

## 2020-02-03 DEATH — deceased

## 2020-02-25 ENCOUNTER — Ambulatory Visit: Payer: BLUE CROSS/BLUE SHIELD | Admitting: Nurse Practitioner

## 2021-01-20 IMAGING — CR SACRUM AND COCCYX - 2+ VIEW
1 series · 3 of 3 positions shown · non-contrast
Comparison: None.

CLINICAL DATA: Pain status post fall

EXAM:
SACRUM AND COCCYX - 2+ VIEW

[Series 1: dg sacrum/coccyx · 0.14mm/px · 3 of 3 slices shown]
[im 1/3]
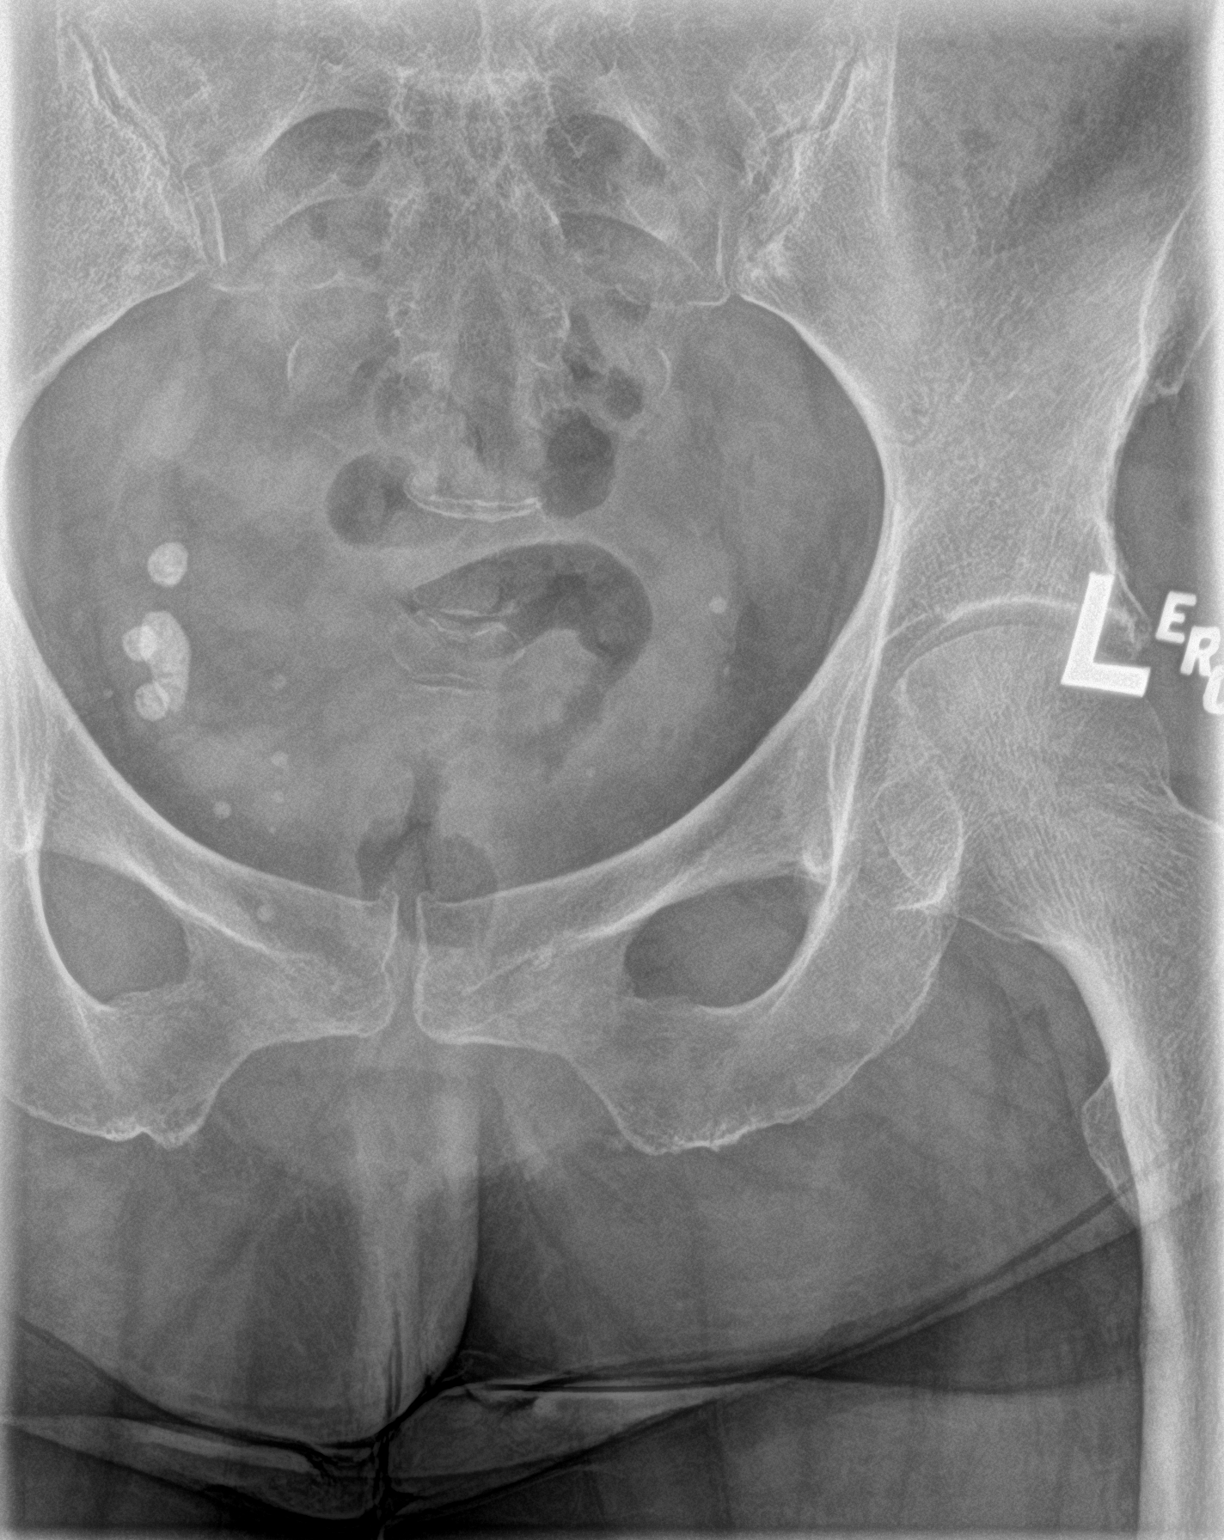
[im 2/3]
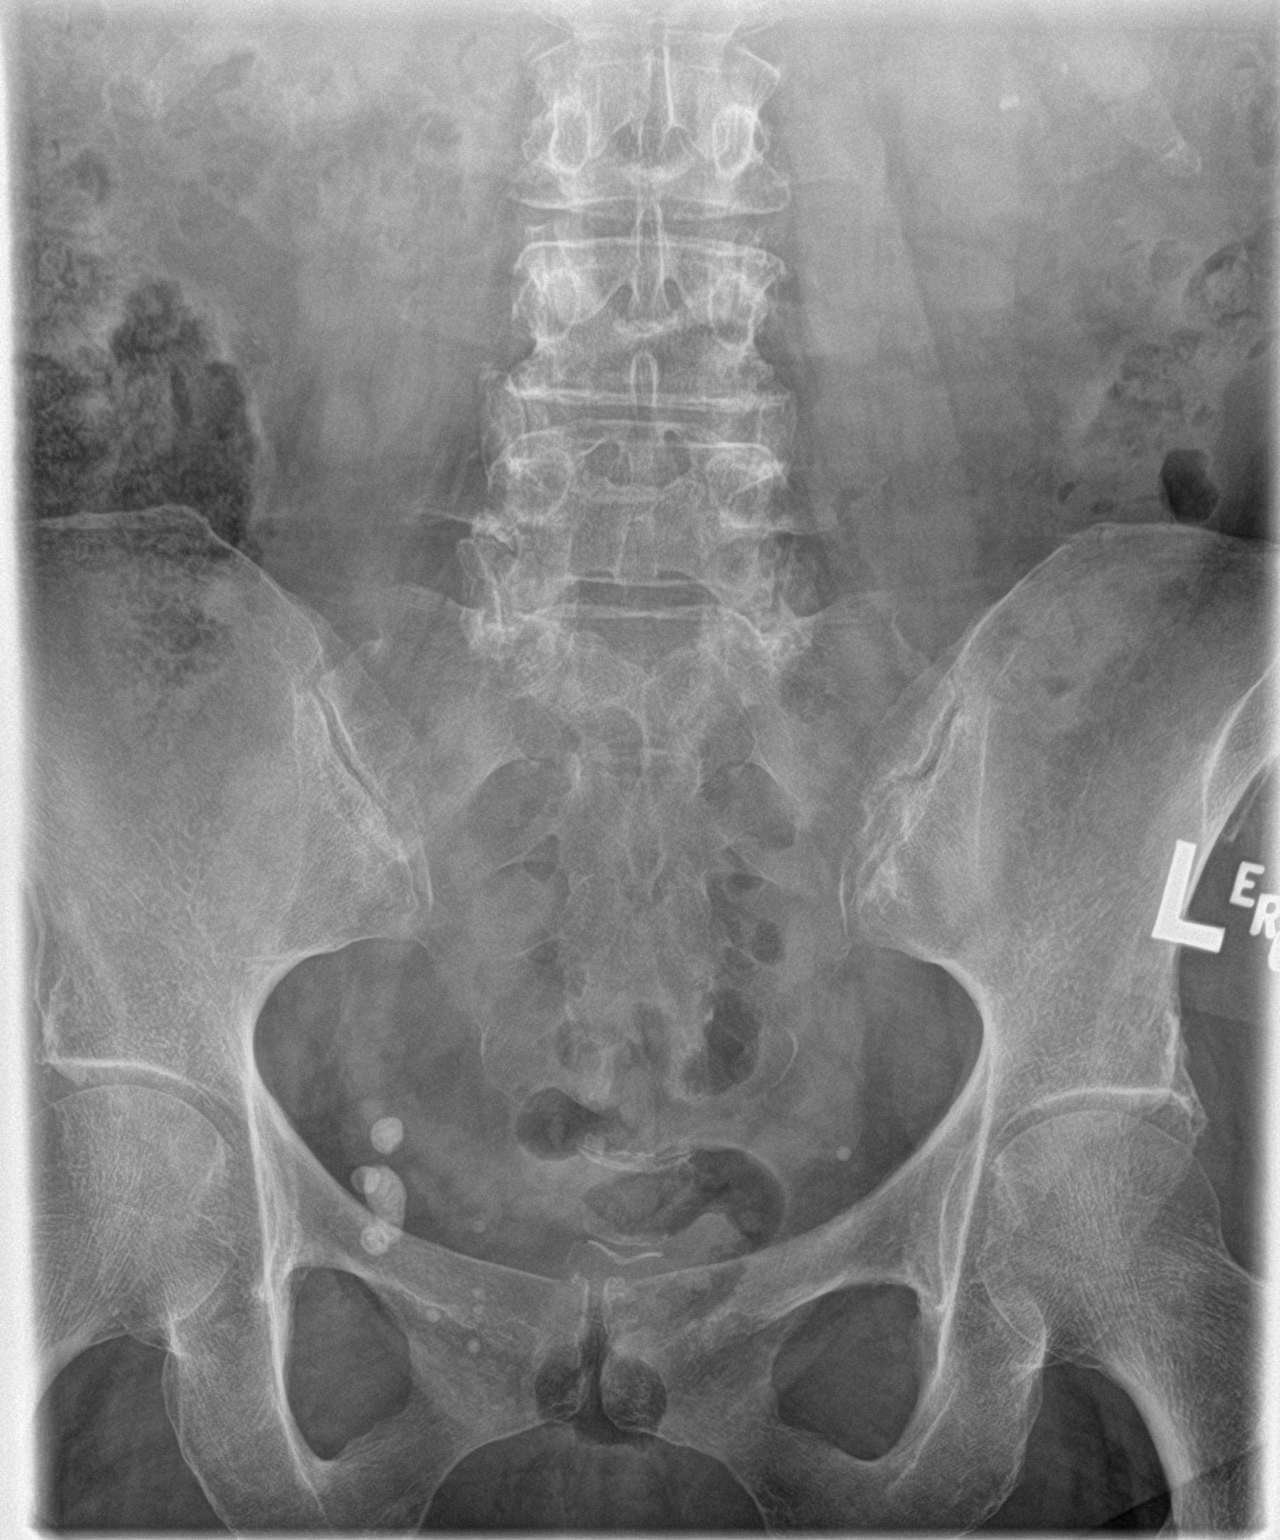
[im 3/3]
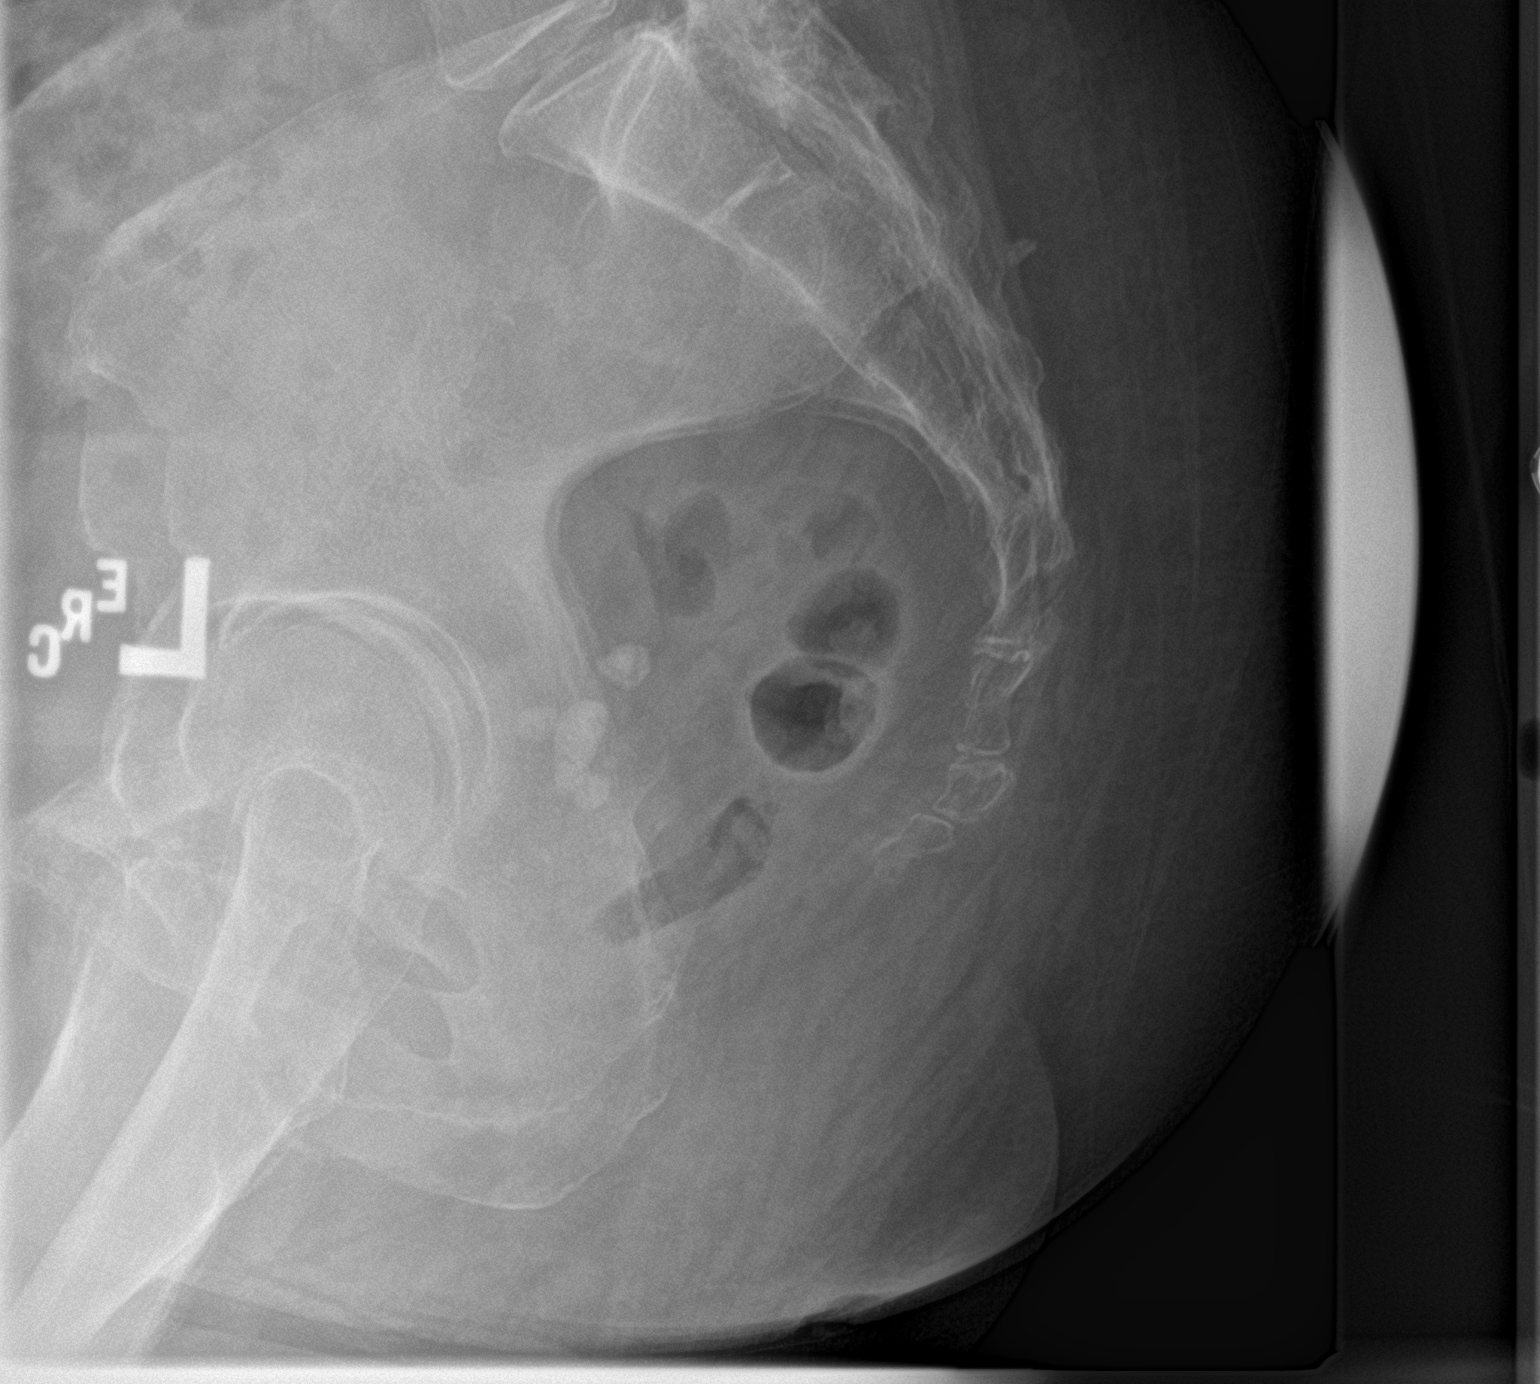

[3 of 3 positions shown; findings below may reference images not displayed]

FINDINGS: There is no evidence of fracture or other focal bone lesions.
IMPRESSION: Negative.

## 2021-04-02 IMAGING — MG DIGITAL DIAGNOSTIC UNILATERAL LEFT MAMMOGRAM WITH TOMO AND CAD
4 series · 4 of 12 positions shown · non-contrast
Comparison: Previous exam(s).

CLINICAL DATA: Patient was called back from screening mammogram for
a possible asymmetry in the left breast.

EXAM:
DIGITAL DIAGNOSTIC LEFT MAMMOGRAM WITH TOMO

[L MLO synth-2D]
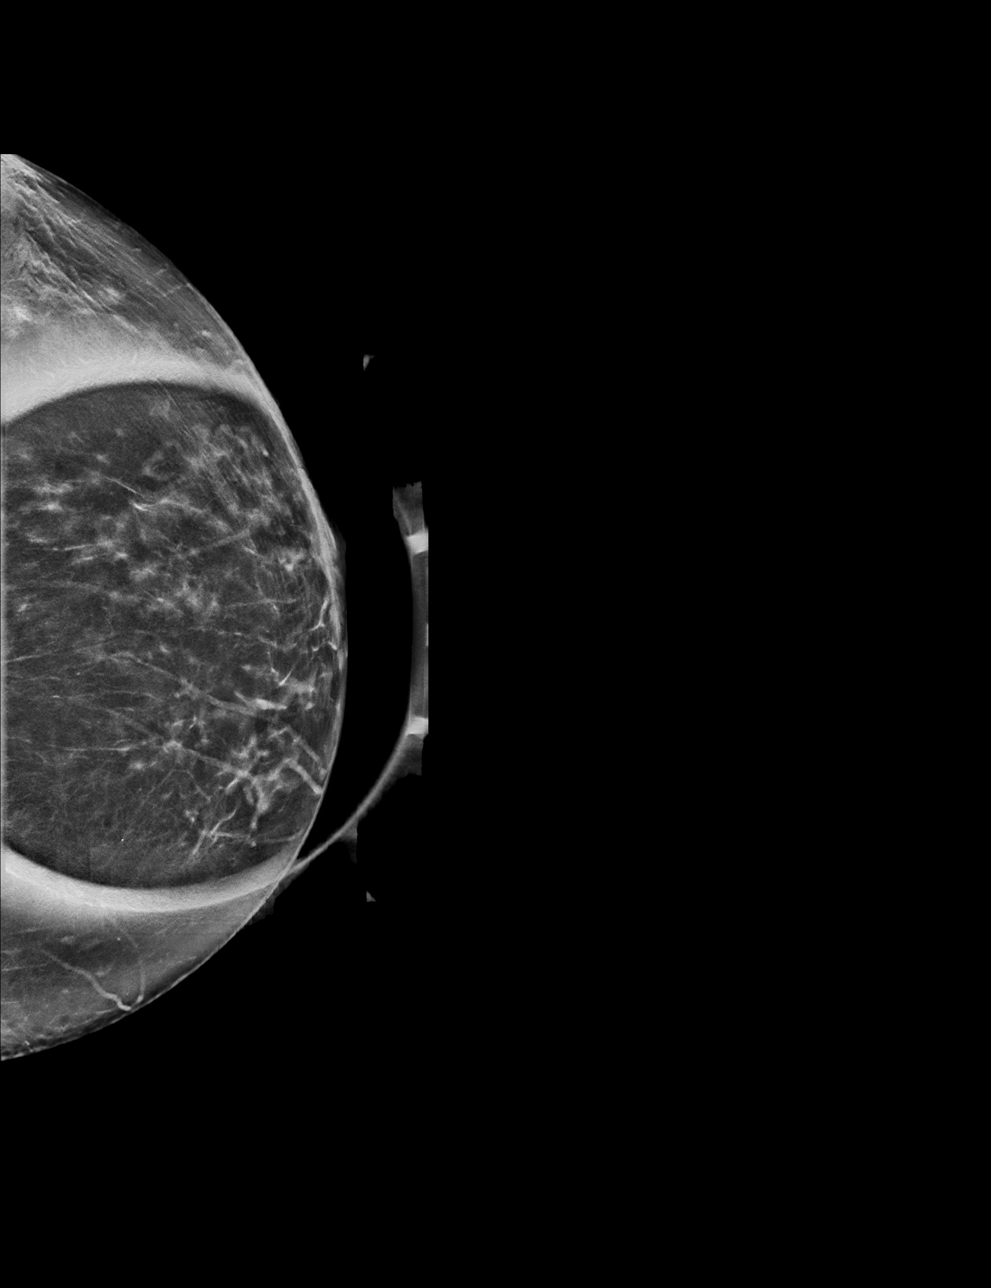

[L CC synth-2D]
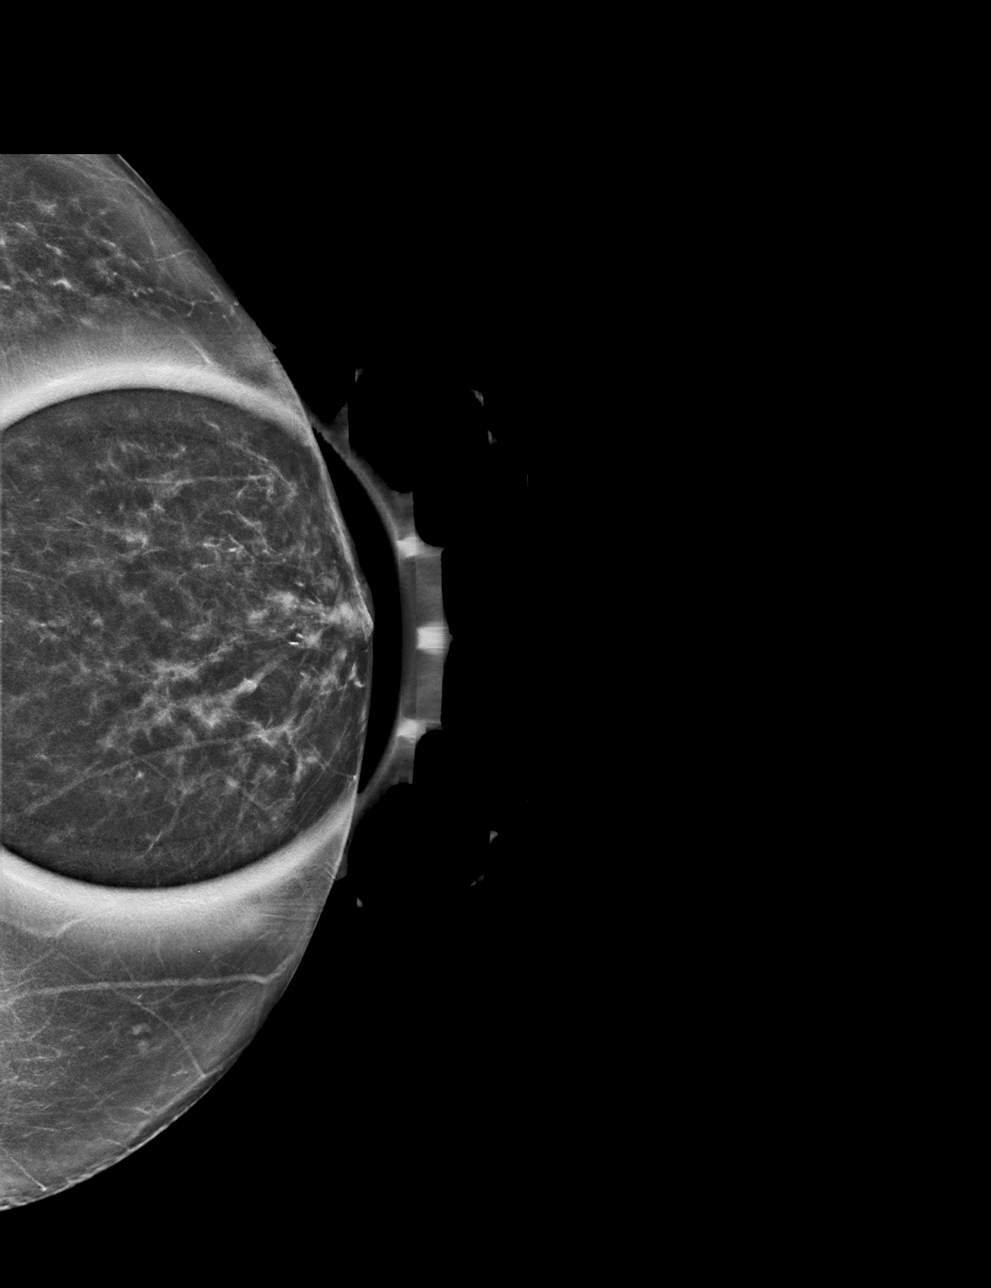

[L MLO tomo · tomo slice 31/61.0]
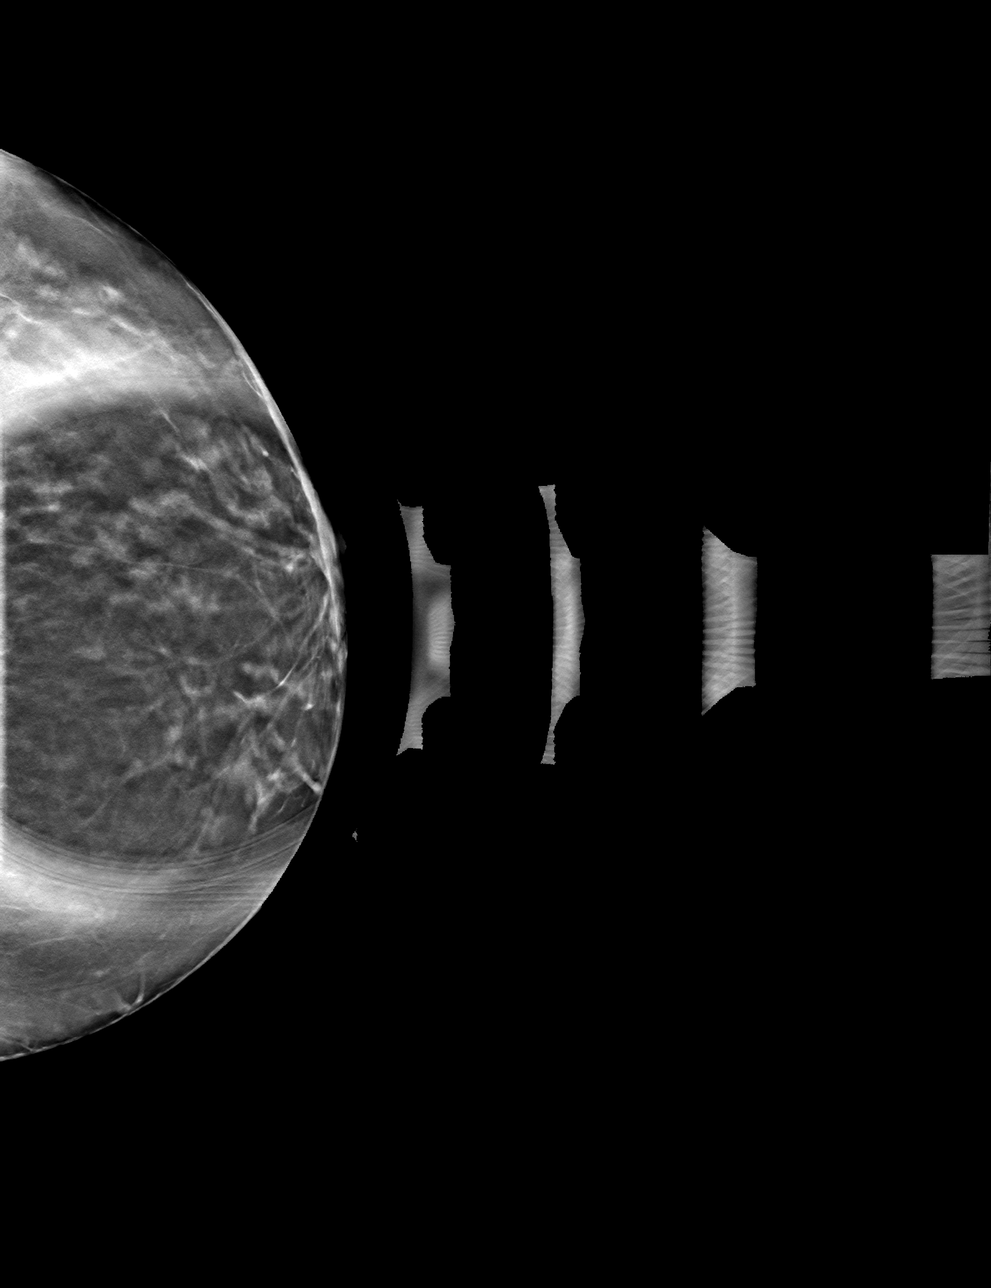

[L CC tomo · tomo slice 27/53.0]
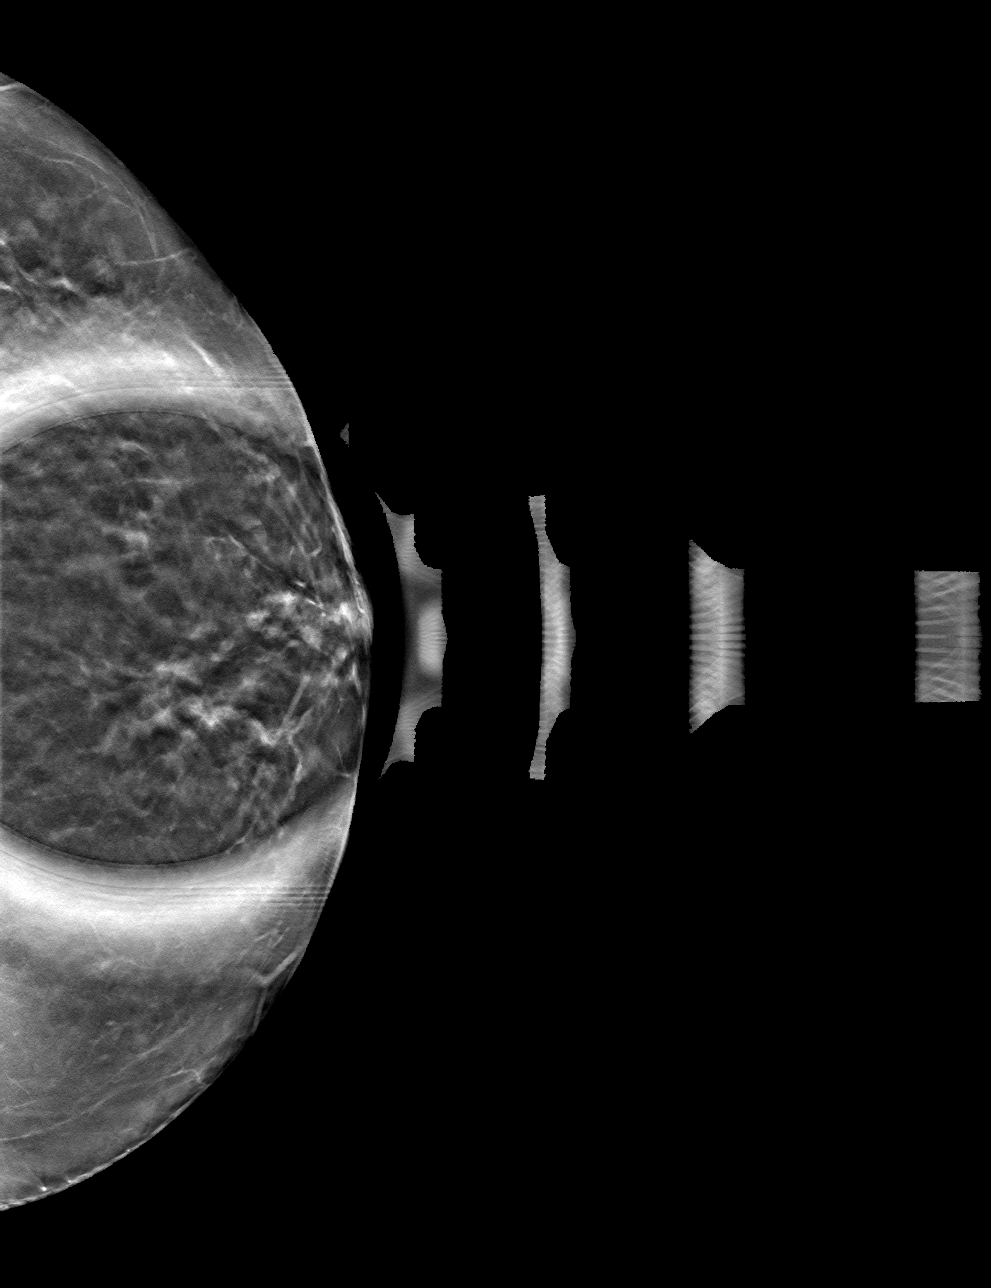

[4 of 12 positions shown; findings below may reference images not displayed]

ACR Breast Density Category b: There are scattered areas of
fibroglandular density.
FINDINGS: Additional imaging of the left breast was performed. No suspicious
mass, malignant type microcalcifications or distortion detected.
IMPRESSION: No evidence of malignancy in the left breast.

RECOMMENDATION:
Bilateral screening mammogram in 1 year is recommended.

I have discussed the findings and recommendations with the patient.
Results were also provided in writing at the conclusion of the
visit. If applicable, a reminder letter will be sent to the patient
regarding the next appointment.

BI-RADS CATEGORY  1: Negative.

## 2021-08-25 IMAGING — CR DG CHEST 2V
1 series · 2 of 2 positions shown · non-contrast
Comparison: February 12, 2018

CLINICAL DATA: Shortness of breath and cough.

EXAM:
CHEST - 2 VIEW

[Series 1: dg chest 2 view · 0.14mm/px · 2 of 2 slices shown]
[im 1/2]
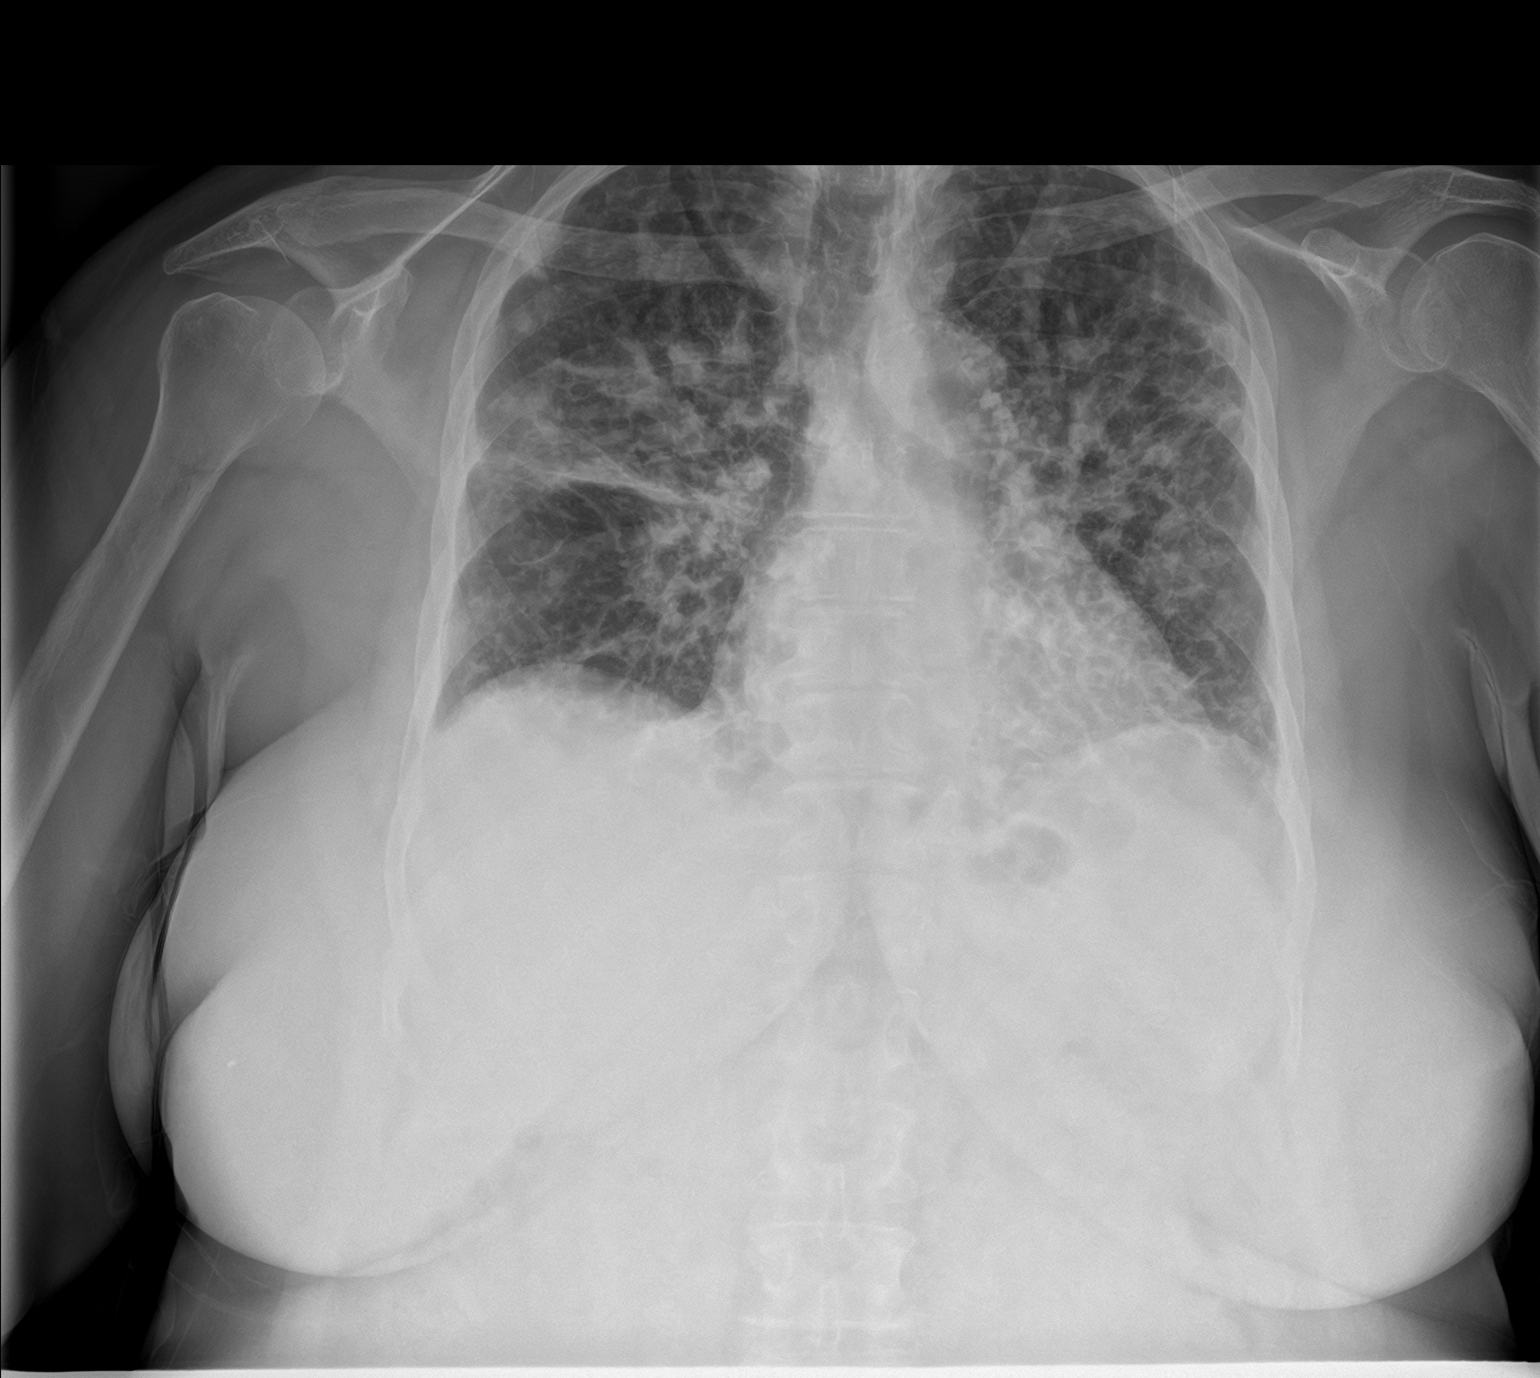
[im 2/2]
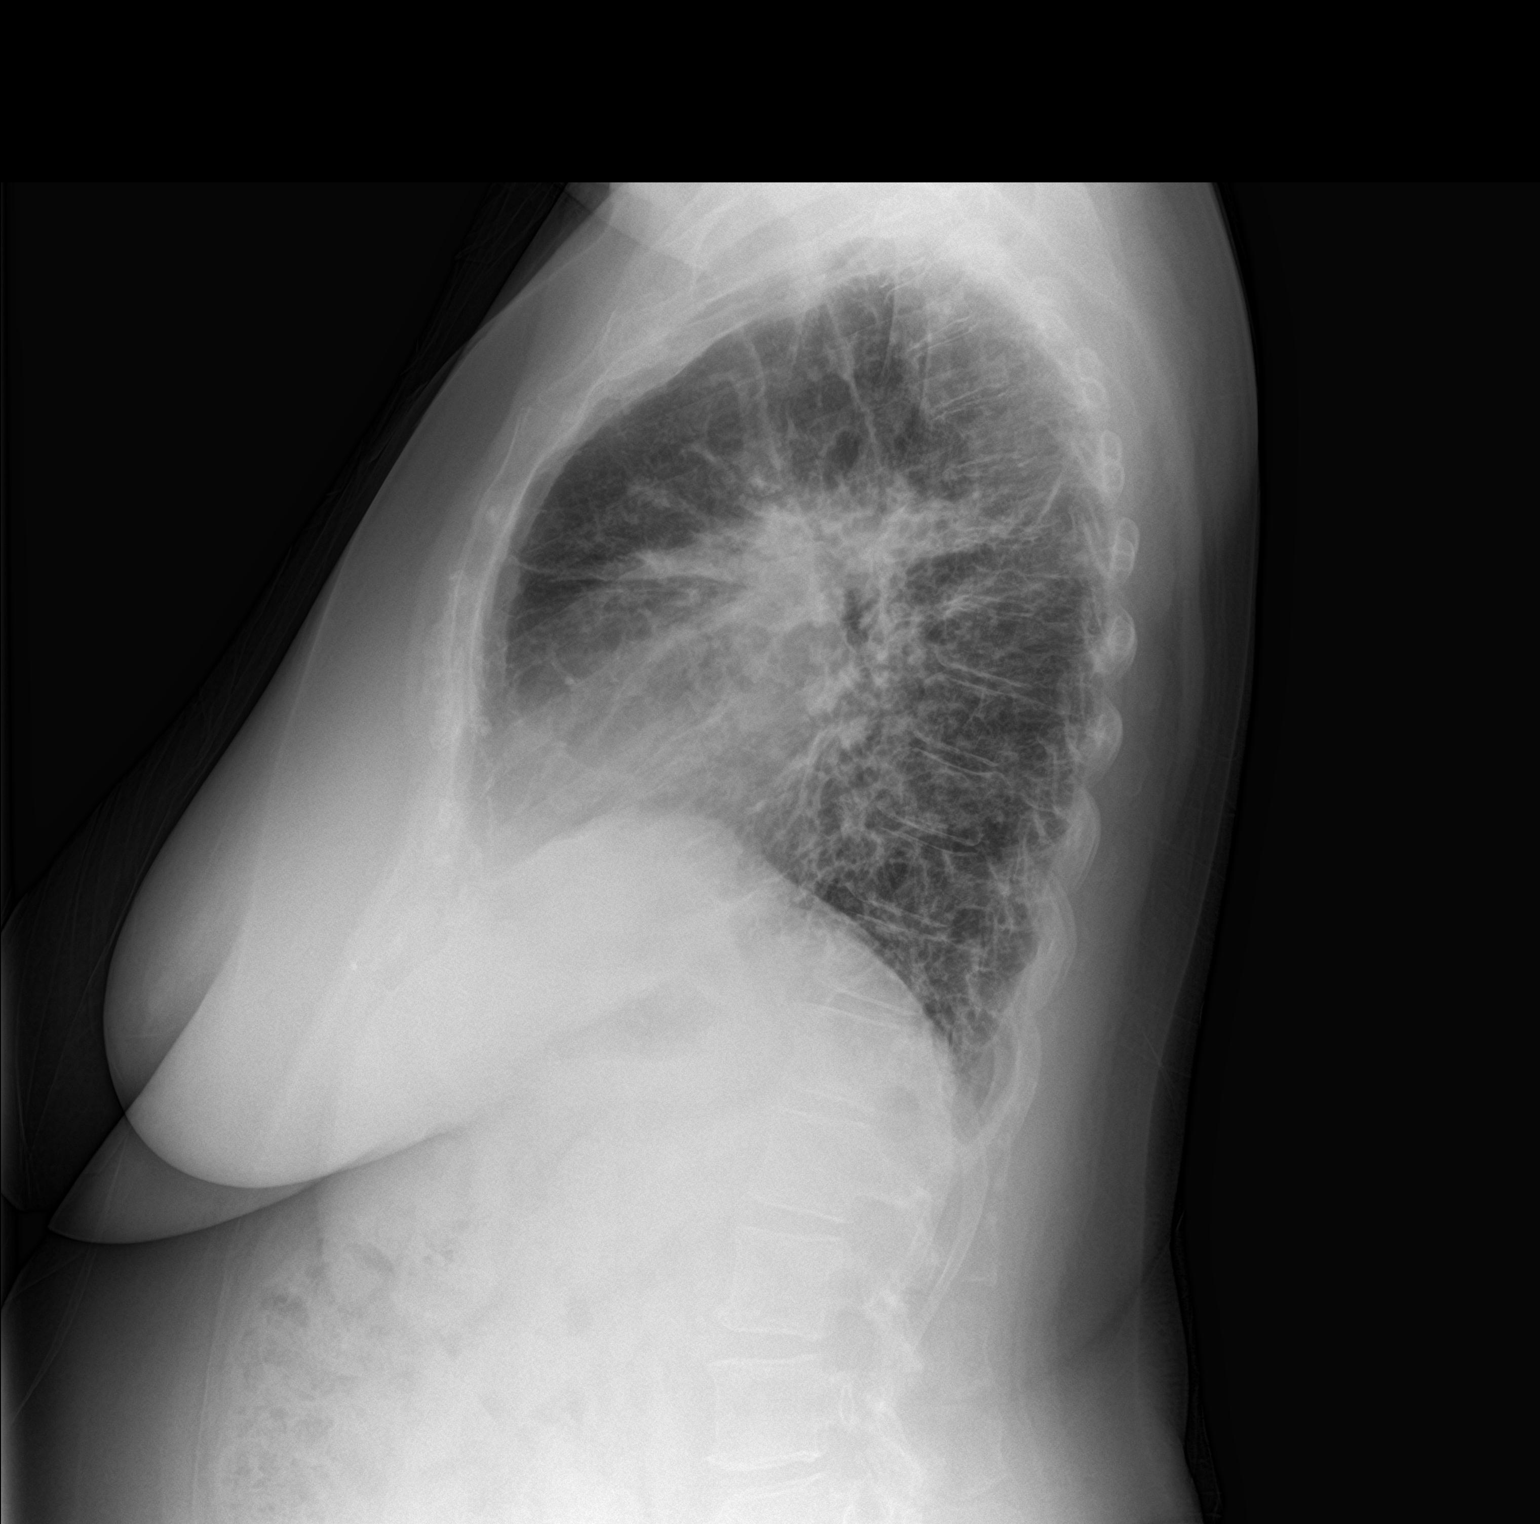

[2 of 2 positions shown; findings below may reference images not displayed]

FINDINGS: The mediastinal contour and cardiac silhouette are normal. Patchy
opacities are identified throughout bilateral lungs. Underlying
chronic fibrotic changes are identified in both lungs. No acute
abnormalities identified in visualized bony structures.
IMPRESSION: 1. Patchy opacities are identified throughout bilateral lungs
suspicious for pneumonia.
2. Underlying chronic fibrotic changes are identified in both lungs.
